# Patient Record
Sex: Male | Born: 1975
Health system: Southern US, Community
[De-identification: ages and names within clinical notes are randomized; demographics above are authoritative.]

## PROBLEM LIST (undated history)

## (undated) DIAGNOSIS — I10 Essential (primary) hypertension: Secondary | ICD-10-CM

## (undated) DIAGNOSIS — E785 Hyperlipidemia, unspecified: Secondary | ICD-10-CM

## (undated) DIAGNOSIS — I4891 Unspecified atrial fibrillation: Secondary | ICD-10-CM

## (undated) DIAGNOSIS — M1990 Unspecified osteoarthritis, unspecified site: Secondary | ICD-10-CM

## (undated) DIAGNOSIS — R7303 Prediabetes: Secondary | ICD-10-CM

## (undated) DIAGNOSIS — I499 Cardiac arrhythmia, unspecified: Secondary | ICD-10-CM

## (undated) HISTORY — PX: NO PAST SURGERIES: SHX2092

## (undated) HISTORY — DX: Unspecified osteoarthritis, unspecified site: M19.90

## (undated) HISTORY — DX: Prediabetes: R73.03

---

## 2013-11-12 ENCOUNTER — Emergency Department (HOSPITAL_COMMUNITY)
Admission: EM | Admit: 2013-11-12 | Discharge: 2013-11-12 | Disposition: A | Discharge status: Home / Self Care | Payer: Self-pay | Source: Home / Self Care | Attending: Family Medicine | Admitting: Family Medicine

## 2013-11-12 ENCOUNTER — Encounter (HOSPITAL_COMMUNITY): Payer: Self-pay | Admitting: Emergency Medicine

## 2013-11-12 DIAGNOSIS — L97909 Non-pressure chronic ulcer of unspecified part of unspecified lower leg with unspecified severity: Secondary | ICD-10-CM

## 2013-11-12 DIAGNOSIS — L97922 Non-pressure chronic ulcer of unspecified part of left lower leg with fat layer exposed: Secondary | ICD-10-CM

## 2013-11-12 MED ORDER — DOXYCYCLINE HYCLATE 100 MG PO CAPS: 100.0000 mg | ORAL_CAPSULE | Freq: Two times a day (BID) | ORAL | Status: DC

## 2013-11-12 NOTE — ED Provider Notes (Signed)
Charles Huang is a 37 y.o. male who presents to Urgent Care today for left lower leg wound. Patient dropped a heavy object onto his left lower anterior leg on or around November 3 at work. He had an initial laceration and was seen at [facility] urgent care. He never notified his employer and has not pursued workers compensation claim yet.  He was prescribed some unknown antibiotics at the urgent care however he is instead took old leftover amoxicillin. The wound has worsened and developed two ulcerations. He notes a foul smell. He denies any significant tenderness fevers or chills. He is able to continue working. He feels well otherwise.  He has no known medical problems   History reviewed. No pertinent past medical history. History  Substance Use Topics  . Smoking status: Current Every Day Smoker  . Smokeless tobacco: Not on file  . Alcohol Use: Yes   ROS as above Medications reviewed. No current facility-administered medications for this encounter.   Current Outpatient Prescriptions  Medication Sig Dispense Refill  . doxycycline (VIBRAMYCIN) 100 MG capsule Take 1 capsule (100 mg total) by mouth 2 (two) times daily.  20 capsule  0    Exam:  BP 154/98  Pulse 111  Temp(Src) 97.6 F (36.4 C) (Oral)  Resp 16  SpO2 96% Gen: Well NAD LEFT LEG:  2 ulcerations on the anterior lower leg. The most distal one is larger measuring approximately 1-1/2 cm in diameter and approximately one centimeters in depth. There is a scar overlying the central portion of the ulceration. The ulceration seems to extend through the dermis into the fat layer. There is granulation tissue present.  The more proximal ulceration is more linear approximately 1 cm in length and a half a centimeter in length and more shallow.  There is no significant surrounding erythema or induration. No significant tenderness.  Capillary refill is intact distally  Assessment and Plan: 37 y.o. male with unstageable ulcerations of  the left lower leg.  Is possibly infected. Plan: We'll use doxycycline initially. Additionally we'll start wet to dry dressing changes and compression to attempt to promote leg wound healing.  Additionally have referred patient to [place] where he should be able to receive followup for this issue.  I have additionally written a letter to his employer and recommended the patient pursue a workers compensation claim for this issue.  Discussed warning signs or symptoms. Please see discharge instructions. Patient expresses understanding.      Rodolph Bong, MD 11/12/13 [phone]

## 2013-11-12 NOTE — ED Notes (Signed)
Pt reports he inj himself at work around 11/3 w/a metal object to the left lower extremity He went to another urgent care where he was given a rx for an antibiotic that he never filled b/c he had left over Amox and hydrocodone Today he states he has a mild pain but what worries him a foul odor.  Last tetanus has been between 5-10 yrs.... Has been going to work daily.  Alert w/no signs of acute distress... Ambulated to exam room w/NAD

## 2014-01-06 ENCOUNTER — Encounter (HOSPITAL_COMMUNITY): Payer: Self-pay | Admitting: Emergency Medicine

## 2014-01-06 ENCOUNTER — Emergency Department (INDEPENDENT_AMBULATORY_CARE_PROVIDER_SITE_OTHER)
Admission: EM | Admit: 2014-01-06 | Discharge: 2014-01-06 | Disposition: A | Discharge status: Home / Self Care | Payer: 59 | Source: Home / Self Care | Attending: Family Medicine | Admitting: Family Medicine

## 2014-01-06 DIAGNOSIS — J069 Acute upper respiratory infection, unspecified: Secondary | ICD-10-CM

## 2014-01-06 LAB — POCT RAPID STREP A: Streptococcus, Group A Screen (Direct): NEGATIVE

## 2014-01-06 MED ORDER — IPRATROPIUM BROMIDE 0.06 % NA SOLN: 2.0000 | Freq: Four times a day (QID) | NASAL | Status: DC

## 2014-01-06 NOTE — Discharge Instructions (Signed)
Please be aware that your blood pressure is elevated at today's visit and that this should be re-checked by your primary care physician in 7-10 days.

## 2014-01-06 NOTE — ED Provider Notes (Signed)
CSN: [id number]     Arrival date & time 01/06/14  1620 History   First MD Initiated Contact with Patient 01/06/14 1732     Chief Complaint  Patient presents with  . Sore Throat   (Consider location/radiation/quality/duration/timing/severity/associated sxs/prior Treatment) Patient is a 38 y.o. male presenting with URI. The history is provided by the patient.  URI Presenting symptoms: congestion, cough, rhinorrhea and sore throat   Severity:  Moderate Onset quality:  Gradual Duration:  5 days Progression:  Waxing and waning Chronicity:  New Associated symptoms: myalgias   Associated symptoms: no swollen glands and no wheezing     History reviewed. No pertinent past medical history. History reviewed. No pertinent past surgical history. History reviewed. No pertinent family history. History  Substance Use Topics  . Smoking status: Current Every Day Smoker  . Smokeless tobacco: Not on file  . Alcohol Use: Yes    Review of Systems  HENT: Positive for congestion, rhinorrhea and sore throat.   Respiratory: Positive for cough. Negative for wheezing.   Musculoskeletal: Positive for myalgias.  All other systems reviewed and are negative.    Allergies  Review of patient's allergies indicates no known allergies.  Home Medications   Current Outpatient Rx  Name  Route  Sig  Dispense  Refill  . doxycycline (VIBRAMYCIN) 100 MG capsule   Oral   Take 1 capsule (100 mg total) by mouth 2 (two) times daily.   20 capsule   0    BP 153/95  Pulse 112  Temp(Src) 98.9 F (37.2 C) (Oral)  Resp 18  SpO2 100% Physical Exam  Nursing note and vitals reviewed. Constitutional: He is oriented to person, place, and time. He appears well-developed and well-nourished. No distress.  +obese  HENT:  Head: Normocephalic and atraumatic.  Right Ear: Hearing, tympanic membrane, external ear and ear canal normal.  Left Ear: Hearing, tympanic membrane, external ear and ear canal normal.  Nose:  Rhinorrhea present.  Mouth/Throat: Uvula is midline and mucous membranes are normal. No oral lesions. No trismus in the jaw. Posterior oropharyngeal erythema present. No oropharyngeal exudate, posterior oropharyngeal edema or tonsillar abscesses.  +PND  Eyes: Conjunctivae are normal. Right eye exhibits no discharge. Left eye exhibits no discharge. No scleral icterus.  Neck: Normal range of motion and full passive range of motion without pain. Neck supple. No thyromegaly present.  Cardiovascular: Normal rate, regular rhythm and normal heart sounds.   Pulmonary/Chest: Effort normal and breath sounds normal.  Abdominal: There is no tenderness.  Musculoskeletal: Normal range of motion.  Lymphadenopathy:    He has no cervical adenopathy.  Neurological: He is alert and oriented to person, place, and time.  Skin: Skin is warm and dry. No rash noted.  Psychiatric: He has a normal mood and affect. His behavior is normal.    ED Course  Procedures (including critical care time) Labs Review Labs Reviewed  POCT RAPID STREP A ([facility] ONLY)   Imaging Review No results found.  EKG Interpretation    Date/Time:    Ventricular Rate:    PR Interval:    QRS Duration:   QT Interval:    QTC Calculation:   R Axis:     Text Interpretation:              MDM  Rapid strep negative. Patient states what is bothering him most at this point is his post nasal drainage.Will prescribe atrovent nasal spray and advise PCP follow up if symptoms do not  improve and for BP recheck as today his BP elevated at time of visit.     Jess Barters [place], [place] 01/06/14 [phone]

## 2014-01-06 NOTE — ED Notes (Signed)
C/o  Sore / swelling of throat.  Stuffy nose.   Headache.  And congestion.  Fever on Tuesday.   Denies any other symptoms.   No relief with otc meds.

## 2014-01-07 NOTE — ED Provider Notes (Signed)
Medical screening examination/treatment/procedure(s) were performed by a resident physician or non-physician practitioner and as the supervising physician I was immediately available for consultation/collaboration.  Judaea Burgoon, MD    Geselle Cardosa S Amaree Leeper, MD 01/07/14 0842 

## 2014-01-08 LAB — CULTURE, GROUP A STREP

## 2014-12-10 ENCOUNTER — Emergency Department (HOSPITAL_BASED_OUTPATIENT_CLINIC_OR_DEPARTMENT_OTHER)
Admission: EM | Admit: 2014-12-10 | Discharge: 2014-12-10 | Disposition: A | Discharge status: Home / Self Care | Payer: BC Managed Care – PPO | Attending: Emergency Medicine | Admitting: Emergency Medicine

## 2014-12-10 ENCOUNTER — Encounter (HOSPITAL_BASED_OUTPATIENT_CLINIC_OR_DEPARTMENT_OTHER): Payer: Self-pay

## 2014-12-10 ENCOUNTER — Emergency Department (HOSPITAL_BASED_OUTPATIENT_CLINIC_OR_DEPARTMENT_OTHER): Payer: BC Managed Care – PPO

## 2014-12-10 DIAGNOSIS — M5416 Radiculopathy, lumbar region: Secondary | ICD-10-CM

## 2014-12-10 DIAGNOSIS — Z720 Tobacco use: Secondary | ICD-10-CM | POA: Insufficient documentation

## 2014-12-10 DIAGNOSIS — M545 Low back pain, unspecified: Secondary | ICD-10-CM

## 2014-12-10 LAB — URINALYSIS, ROUTINE W REFLEX MICROSCOPIC
BILIRUBIN URINE: NEGATIVE
Glucose, UA: NEGATIVE mg/dL
Ketones, ur: NEGATIVE mg/dL
Leukocytes, UA: NEGATIVE
Nitrite: NEGATIVE
PROTEIN: NEGATIVE mg/dL
Specific Gravity, Urine: 1.016 (ref 1.005–1.030)
Urobilinogen, UA: 0.2 mg/dL (ref 0.0–1.0)
pH: 6.5 (ref 5.0–8.0)

## 2014-12-10 LAB — URINE MICROSCOPIC-ADD ON

## 2014-12-10 MED ORDER — PREDNISONE 20 MG PO TABS: ORAL_TABLET | ORAL | Status: DC

## 2014-12-10 MED ORDER — HYDROCODONE-ACETAMINOPHEN 5-325 MG PO TABS: 2.0000 | ORAL_TABLET | ORAL | Status: DC | PRN

## 2014-12-10 MED ORDER — CYCLOBENZAPRINE HCL 10 MG PO TABS: 10.0000 mg | ORAL_TABLET | Freq: Three times a day (TID) | ORAL | Status: DC | PRN

## 2014-12-10 MED ORDER — DEXAMETHASONE SODIUM PHOSPHATE 10 MG/ML IJ SOLN
10.0000 mg | Freq: Once | INTRAMUSCULAR | Status: AC
  Administered 2014-12-10: 10 mg via INTRAMUSCULAR
  Filled 2014-12-10: qty 1

## 2014-12-10 NOTE — ED Provider Notes (Signed)
CSN: [id number]     Arrival date & time 12/10/14  1045 History   First MD Initiated Contact with Patient 12/10/14 1057     Chief Complaint  Patient presents with  . Back Pain     (Consider location/radiation/quality/duration/timing/severity/associated sxs/prior Treatment) HPI Comments: Patient presents to the ER for evaluation of low back pain. Patient reports that pain started 2 days ago. He denies any injury. Pain is in the lower back diffusely but doesn't radiate down the back and left leg. No loss of strength or sensation in the lower extremities. No change of bowel or bladder function.  Patient is a 38 y.o. male presenting with back pain.  Back Pain   Past Medical History  Diagnosis Date  . Back pain    History reviewed. No pertinent past surgical history. No family history on file. History  Substance Use Topics  . Smoking status: Current Every Day Smoker  . Smokeless tobacco: Not on file  . Alcohol Use: Yes    Review of Systems  Musculoskeletal: Positive for back pain.  All other systems reviewed and are negative.     Allergies  Review of patient's allergies indicates no known allergies.  Home Medications   Prior to Admission medications   Not on File   BP 157/110 mmHg  Pulse 106  Temp(Src) 98.3 F (36.8 C) (Oral)  Resp 20  Ht 6\' 4"  (1.93 m)  Wt 370 lb (167.831 kg)  BMI 45.06 kg/m2  SpO2 98% Physical Exam  Constitutional: He is oriented to person, place, and time. He appears well-developed and well-nourished. No distress.  HENT:  Head: Normocephalic and atraumatic.  Right Ear: Hearing normal.  Left Ear: Hearing normal.  Nose: Nose normal.  Mouth/Throat: Oropharynx is clear and moist and mucous membranes are normal.  Eyes: Conjunctivae and EOM are normal. Pupils are equal, round, and reactive to light.  Neck: Normal range of motion. Neck supple.  Cardiovascular: Regular rhythm, S1 normal and S2 normal.  Exam reveals no gallop and no friction rub.     No murmur heard. Pulmonary/Chest: Effort normal and breath sounds normal. No respiratory distress. He exhibits no tenderness.  Abdominal: Soft. Normal appearance and bowel sounds are normal. There is no hepatosplenomegaly. There is no tenderness. There is no rebound, no guarding, no tenderness at McBurney's point and negative Murphy's sign. No hernia.  Musculoskeletal: Normal range of motion.       Lumbar back: He exhibits tenderness.       Back:  Neurological: He is alert and oriented to person, place, and time. He has normal strength. No cranial nerve deficit or sensory deficit. Coordination normal. GCS eye subscore is 4. GCS verbal subscore is 5. GCS motor subscore is 6.  Slightly positive straight leg raise  Skin: Skin is warm, dry and intact. No rash noted. No cyanosis.  Psychiatric: He has a normal mood and affect. His speech is normal and behavior is normal. Thought content normal.  Nursing note and vitals reviewed.   ED Course  Procedures (including critical care time) Labs Review Labs Reviewed  URINALYSIS, ROUTINE W REFLEX MICROSCOPIC - Abnormal; Notable for the following:    Hgb urine dipstick TRACE (*)    All other components within normal limits  URINE MICROSCOPIC-ADD ON    Imaging Review Dg Lumbar Spine Complete  12/10/2014   CLINICAL DATA:  Low back pain for 3 day  EXAM: LUMBAR SPINE - COMPLETE 4+ VIEW  COMPARISON:  None.  FINDINGS: Anatomic alignment. No vertebral  compression. Disc height maintained.  IMPRESSION: Negative.   Electronically Signed   By: Maryclare BeanArt  Hoss M.D.   On: 12/10/2014 11:49     EKG Interpretation None      MDM   Final diagnoses:  Low back pain   Patient presents to the ER for evaluation of low back pain. Patient reports that he has had intermittent problems with his back in the past. There was no injury today. Patient had an x-ray which showed no abnormality. Urinalysis did not suggest infection and he does not appear consistent with a kidney  stone. Patient to be treated empirically for mild lumbar radiculopathy.    Gilda Creasehristopher J. Pollina, MD 12/10/14 1248

## 2014-12-10 NOTE — ED Notes (Signed)
Patient here with lower back pain x 2 days, denies injury but reports problems with same in past that required steroids for treatment. Pain with ambulation and change in position

## 2014-12-10 NOTE — Discharge Instructions (Signed)

## 2015-01-18 ENCOUNTER — Other Ambulatory Visit: Payer: Self-pay | Admitting: Physician Assistant

## 2015-01-18 ENCOUNTER — Ambulatory Visit
Admission: RE | Admit: 2015-01-18 | Discharge: 2015-01-18 | Disposition: A | Discharge status: Home / Self Care | Payer: BLUE CROSS/BLUE SHIELD | Source: Ambulatory Visit | Attending: Physician Assistant | Admitting: Physician Assistant

## 2015-01-18 DIAGNOSIS — M25562 Pain in left knee: Principal | ICD-10-CM

## 2015-01-18 DIAGNOSIS — M25561 Pain in right knee: Secondary | ICD-10-CM

## 2015-05-30 ENCOUNTER — Encounter (HOSPITAL_BASED_OUTPATIENT_CLINIC_OR_DEPARTMENT_OTHER): Payer: Self-pay | Admitting: *Deleted

## 2015-05-30 ENCOUNTER — Emergency Department (HOSPITAL_BASED_OUTPATIENT_CLINIC_OR_DEPARTMENT_OTHER): Payer: BLUE CROSS/BLUE SHIELD

## 2015-05-30 ENCOUNTER — Emergency Department (HOSPITAL_BASED_OUTPATIENT_CLINIC_OR_DEPARTMENT_OTHER)
Admission: EM | Admit: 2015-05-30 | Discharge: 2015-05-30 | Disposition: A | Discharge status: Home / Self Care | Payer: BLUE CROSS/BLUE SHIELD | Attending: Emergency Medicine | Admitting: Emergency Medicine

## 2015-05-30 DIAGNOSIS — E785 Hyperlipidemia, unspecified: Secondary | ICD-10-CM | POA: Diagnosis not present

## 2015-05-30 DIAGNOSIS — M5412 Radiculopathy, cervical region: Secondary | ICD-10-CM | POA: Insufficient documentation

## 2015-05-30 DIAGNOSIS — Z720 Tobacco use: Secondary | ICD-10-CM | POA: Diagnosis not present

## 2015-05-30 DIAGNOSIS — Z79899 Other long term (current) drug therapy: Secondary | ICD-10-CM | POA: Diagnosis not present

## 2015-05-30 DIAGNOSIS — I10 Essential (primary) hypertension: Secondary | ICD-10-CM | POA: Insufficient documentation

## 2015-05-30 DIAGNOSIS — R0789 Other chest pain: Secondary | ICD-10-CM | POA: Insufficient documentation

## 2015-05-30 DIAGNOSIS — R079 Chest pain, unspecified: Secondary | ICD-10-CM | POA: Diagnosis present

## 2015-05-30 HISTORY — DX: Essential (primary) hypertension: I10

## 2015-05-30 HISTORY — DX: Hyperlipidemia, unspecified: E78.5

## 2015-05-30 LAB — COMPREHENSIVE METABOLIC PANEL
ALT: 50 U/L (ref 17–63)
AST: 33 U/L (ref 15–41)
Albumin: 4 g/dL (ref 3.5–5.0)
Alkaline Phosphatase: 45 U/L (ref 38–126)
Anion gap: 10 (ref 5–15)
BUN: 15 mg/dL (ref 6–20)
CHLORIDE: 101 mmol/L (ref 101–111)
CO2: 29 mmol/L (ref 22–32)
CREATININE: 1.13 mg/dL (ref 0.61–1.24)
Calcium: 9.4 mg/dL (ref 8.9–10.3)
GFR calc non Af Amer: >60 mL/min (ref >60)
Glucose, Bld: 99 mg/dL (ref 65–99)
Potassium: 4 mmol/L (ref 3.5–5.1)
SODIUM: 140 mmol/L (ref 135–145)
TOTAL PROTEIN: 7.3 g/dL (ref 6.5–8.1)
Total Bilirubin: 0.5 mg/dL (ref 0.3–1.2)

## 2015-05-30 LAB — CBC WITH DIFFERENTIAL/PLATELET
Basophils Absolute: 0 10*3/uL (ref 0.0–0.1)
Basophils Relative: 0 % (ref 0–1)
Eosinophils Absolute: 0.1 10*3/uL (ref 0.0–0.7)
Eosinophils Relative: 1 % (ref 0–5)
HCT: 43.8 % (ref 39.0–52.0)
HEMOGLOBIN: 14.3 g/dL (ref 13.0–17.0)
LYMPHS ABS: 2.3 10*3/uL (ref 0.7–4.0)
Lymphocytes Relative: 27 % (ref 12–46)
MCH: 27 pg (ref 26.0–34.0)
MCHC: 32.6 g/dL (ref 30.0–36.0)
MCV: 82.6 fL (ref 78.0–100.0)
MONO ABS: 0.7 10*3/uL (ref 0.1–1.0)
MONOS PCT: 9 % (ref 3–12)
NEUTROS PCT: 63 % (ref 43–77)
Neutro Abs: 5.3 10*3/uL (ref 1.7–7.7)
Platelets: 220 10*3/uL (ref 150–400)
RBC: 5.3 MIL/uL (ref 4.22–5.81)
RDW: 15.2 % (ref 11.5–15.5)
WBC: 8.5 10*3/uL (ref 4.0–10.5)

## 2015-05-30 LAB — TROPONIN I: Troponin I: <0.03 ng/mL (ref <0.031)

## 2015-05-30 MED ORDER — PREDNISONE 20 MG PO TABS
ORAL_TABLET | ORAL | Status: DC
Start: 2015-05-30 — End: 2016-06-23

## 2015-05-30 MED ORDER — CYCLOBENZAPRINE HCL 10 MG PO TABS: 10.0000 mg | ORAL_TABLET | Freq: Three times a day (TID) | ORAL | Status: DC | PRN

## 2015-05-30 MED ORDER — HYDROCODONE-ACETAMINOPHEN 5-325 MG PO TABS: 2.0000 | ORAL_TABLET | ORAL | Status: DC | PRN

## 2015-05-30 NOTE — ED Notes (Signed)
Pt c/o intermittent left side CP x2 weeks. Pt and family sts pt has not been feeling well for 2 months. He reports some SOB and radiation to left arm shoulder.

## 2015-05-30 NOTE — ED Notes (Signed)
Pt returned from xray

## 2015-05-30 NOTE — Discharge Instructions (Signed)
Cervical Radiculopathy Cervical radiculopathy happens when a nerve in the neck is pinched or bruised by a slipped (herniated) disk or by arthritic changes in the bones of the cervical spine. This can occur due to an injury or as part of the normal aging process. Pressure on the cervical nerves can cause pain or numbness that runs from your neck all the way down into your arm and fingers. CAUSES  There are many possible causes, including:  Injury.  Muscle tightness in the neck from overuse.  Swollen, painful joints (arthritis).  Breakdown or degeneration in the bones and joints of the spine (spondylosis) due to aging.  Bone spurs that may develop near the cervical nerves. SYMPTOMS  Symptoms include pain, weakness, or numbness in the affected arm and hand. Pain can be severe or irritating. Symptoms may be worse when extending or turning the neck. DIAGNOSIS  Your caregiver will ask about your symptoms and do a physical exam. He or she may test your strength and reflexes. X-rays, CT scans, and MRI scans may be needed in cases of injury or if the symptoms do not go away after a period of time. Electromyography (EMG) or nerve conduction testing may be done to study how your nerves and muscles are working. TREATMENT  Your caregiver may recommend certain exercises to help relieve your symptoms. Cervical radiculopathy can, and often does, get better with time and treatment. If your problems continue, treatment options may include:  Wearing a soft collar for short periods of time.  Physical therapy to strengthen the neck muscles.  Medicines, such as nonsteroidal anti-inflammatory drugs (NSAIDs), oral corticosteroids, or spinal injections.  Surgery. Different types of surgery may be done depending on the cause of your problems. HOME CARE INSTRUCTIONS   Put ice on the affected area.  Put ice in a plastic bag.  Place a towel between your skin and the bag.  Leave the ice on for 15-20 minutes,  03-04 times a day or as directed by your caregiver.  If ice does not help, you can try using heat. Take a warm shower or bath, or use a hot water bottle as directed by your caregiver.  You may try a gentle neck and shoulder massage.  Use a flat pillow when you sleep.  Only take over-the-counter or prescription medicines for pain, discomfort, or fever as directed by your caregiver.  If physical therapy was prescribed, follow your caregiver's directions.  If a soft collar was prescribed, use it as directed. SEEK IMMEDIATE MEDICAL CARE IF:   Your pain gets much worse and cannot be controlled with medicines.  You have weakness or numbness in your hand, arm, face, or leg.  You have a high fever or a stiff, rigid neck.  You lose bowel or bladder control (incontinence).  You have trouble with walking, balance, or speaking. MAKE SURE YOU:   Understand these instructions.  Will watch your condition.  Will get help right away if you are not doing well or get worse. Document Released: 08/26/2001 Document Revised: 02/23/2012 Document Reviewed: 07/15/2011 [facility] Patient Information 2015 [place], [place]. This information is not intended to replace advice given to you by your health care provider. Make sure you discuss any questions you have with your health care provider.  Chest Pain (Nonspecific) It is often hard to give a specific diagnosis for the cause of chest pain. There is always a chance that your pain could be related to something serious, such as a heart attack or a blood clot  in the lungs. You need to follow up with your health care provider for further evaluation. CAUSES   Heartburn.  Pneumonia or bronchitis.  Anxiety or stress.  Inflammation around your heart (pericarditis) or lung (pleuritis or pleurisy).  A blood clot in the lung.  A collapsed lung (pneumothorax). It can develop suddenly on its own (spontaneous pneumothorax) or from trauma to the chest.  Shingles  infection (herpes zoster virus). The chest wall is composed of bones, muscles, and cartilage. Any of these can be the source of the pain.  The bones can be bruised by injury.  The muscles or cartilage can be strained by coughing or overwork.  The cartilage can be affected by inflammation and become sore (costochondritis). DIAGNOSIS  Lab tests or other studies may be needed to find the cause of your pain. Your health care provider may have you take a test called an ambulatory electrocardiogram (ECG). An ECG records your heartbeat patterns over a 24-hour period. You may also have other tests, such as:  Transthoracic echocardiogram (TTE). During echocardiography, sound waves are used to evaluate how blood flows through your heart.  Transesophageal echocardiogram (TEE).  Cardiac monitoring. This allows your health care provider to monitor your heart rate and rhythm in real time.  Holter monitor. This is a portable device that records your heartbeat and can help diagnose heart arrhythmias. It allows your health care provider to track your heart activity for several days, if needed.  Stress tests by exercise or by giving medicine that makes the heart beat faster. TREATMENT   Treatment depends on what may be causing your chest pain. Treatment may include:  Acid blockers for heartburn.  Anti-inflammatory medicine.  Pain medicine for inflammatory conditions.  Antibiotics if an infection is present.  You may be advised to change lifestyle habits. This includes stopping smoking and avoiding alcohol, caffeine, and chocolate.  You may be advised to keep your head raised (elevated) when sleeping. This reduces the chance of acid going backward from your stomach into your esophagus. Most of the time, nonspecific chest pain will improve within 2-3 days with rest and mild pain medicine.  HOME CARE INSTRUCTIONS   If antibiotics were prescribed, take them as directed. Finish them even if you start  to feel better.  For the next few days, avoid physical activities that bring on chest pain. Continue physical activities as directed.  Do not use any tobacco products, including cigarettes, chewing tobacco, or electronic cigarettes.  Avoid drinking alcohol.  Only take medicine as directed by your health care provider.  Follow your health care provider's suggestions for further testing if your chest pain does not go away.  Keep any follow-up appointments you made. If you do not go to an appointment, you could develop lasting (chronic) problems with pain. If there is any problem keeping an appointment, call to reschedule. SEEK MEDICAL CARE IF:   Your chest pain does not go away, even after treatment.  You have a rash with blisters on your chest.  You have a fever. SEEK IMMEDIATE MEDICAL CARE IF:   You have increased chest pain or pain that spreads to your arm, neck, jaw, back, or abdomen.  You have shortness of breath.  You have an increasing cough, or you cough up blood.  You have severe back or abdominal pain.  You feel nauseous or vomit.  You have severe weakness.  You faint.  You have chills. This is an emergency. Do not wait to see if the pain  will go away. Get medical help at once. Call your local emergency services (911 in [place] Do not drive yourself to the hospital. MAKE SURE YOU:   Understand these instructions.  Will watch your condition.  Will get help right away if you are not doing well or get worse. Document Released: 09/10/2005 Document Revised: 12/06/2013 Document Reviewed: 07/06/2008 [facility] Patient Information 2015 [place], [place]. This information is not intended to replace advice given to you by your health care provider. Make sure you discuss any questions you have with your health care provider.

## 2015-05-30 NOTE — ED Notes (Signed)
Patient transported to X-ray 

## 2015-05-30 NOTE — ED Provider Notes (Signed)
CSN: [id number]     Arrival date & time 05/30/15  0919 History   First MD Initiated Contact with Patient 05/30/15 [phone]     Chief Complaint  Patient presents with  . Chest Pain     (Consider location/radiation/quality/duration/timing/severity/associated sxs/prior Treatment) HPI Comments: Patient presents to the emergency department for evaluation of chest pain. Patient reports the symptoms have been ongoing for weeks. He was recently seen by his doctor and diagnosed with elevated blood pressure and cholesterol. He was started on medication for both. Patient reports for the last 2 weeks he has been having intermittent episodes of pain in his chest. He reports a numbness and pain in the left side of his neck, left upper chest, left arm. This occurs intermittently. He reports that it lasts less than 1 minute when it occurs. Symptoms can occur while at rest. He has not noticed any relation to exertion.  Patient reports that multiple family members have had heart problems in his family, but he is not sure what the diagnosis was. He is a smoker, but is currently quitting.  Patient is a 39 y.o. male presenting with chest pain.  Chest Pain   Past Medical History  Diagnosis Date  . Back pain   . Hypertension   . Hyperlipidemia    History reviewed. No pertinent past surgical history. No family history on file. History  Substance Use Topics  . Smoking status: Current Some Day Smoker  . Smokeless tobacco: Not on file  . Alcohol Use: Yes    Review of Systems  Cardiovascular: Positive for chest pain.  All other systems reviewed and are negative.     Allergies  Review of patient's allergies indicates no known allergies.  Home Medications   Prior to Admission medications   Medication Sig Start Date End Date Taking? Authorizing Provider  atorvastatin (LIPITOR) 10 MG tablet Take 10 mg by mouth daily.   Yes Historical Provider, MD  lisinopril (PRINIVIL,ZESTRIL) 20 MG tablet Take 20 mg by  mouth daily.   Yes Historical Provider, MD  cyclobenzaprine (FLEXERIL) 10 MG tablet Take 1 tablet (10 mg total) by mouth 3 (three) times daily as needed for muscle spasms. 12/10/14   Gilda Crease, MD  HYDROcodone-acetaminophen (NORCO/VICODIN) 5-325 MG per tablet Take 2 tablets by mouth every 4 (four) hours as needed for moderate pain. 12/10/14   Gilda Crease, MD  predniSONE (DELTASONE) 20 MG tablet 3 tabs po daily x 3 days, then 2 tabs x 3 days, then 1.5 tabs x 3 days, then 1 tab x 3 days, then 0.5 tabs x 3 days 12/10/14   Gilda Crease, MD   BP 157/90 mmHg  Pulse 83  Temp(Src) 98.5 F (36.9 C) (Oral)  Resp 20  Ht  (1.93 m)  Wt 375 lb (170.099 kg)  BMI 45.67 kg/m2  SpO2 97% Physical Exam  Constitutional: He is oriented to person, place, and time. He appears well-developed and well-nourished. No distress.  HENT:  Head: Normocephalic and atraumatic.  Right Ear: Hearing normal.  Left Ear: Hearing normal.  Nose: Nose normal.  Mouth/Throat: Oropharynx is clear and moist and mucous membranes are normal.  Eyes: Conjunctivae and EOM are normal. Pupils are equal, round, and reactive to light.  Neck: Normal range of motion. Neck supple.  Cardiovascular: Regular rhythm, S1 normal and S2 normal.  Exam reveals no gallop and no friction rub.   No murmur heard. Pulmonary/Chest: Effort normal and breath sounds normal. No respiratory distress. He exhibits  no tenderness.  Abdominal: Soft. Normal appearance and bowel sounds are normal. There is no hepatosplenomegaly. There is no tenderness. There is no rebound, no guarding, no tenderness at McBurney's point and negative Murphy's sign. No hernia.  Musculoskeletal: Normal range of motion.  Neurological: He is alert and oriented to person, place, and time. He has normal strength. No cranial nerve deficit or sensory deficit. Coordination normal. GCS eye subscore is 4. GCS verbal subscore is 5. GCS motor subscore is 6.  Skin:  Skin is warm, dry and intact. No rash noted. No cyanosis.  Psychiatric: He has a normal mood and affect. His speech is normal and behavior is normal. Thought content normal.  Nursing note and vitals reviewed.   ED Course  Procedures (including critical care time) Labs Review Labs Reviewed  CBC WITH DIFFERENTIAL/PLATELET  COMPREHENSIVE METABOLIC PANEL  TROPONIN I    Imaging Review No results found.   EKG Interpretation   Date/Time:  Wednesday May 30 2015 09:27:41 EDT Ventricular Rate:  69 PR Interval:  152 QRS Duration: 102 QT Interval:  374 QTC Calculation: 400 R Axis:   51 Text Interpretation:  Normal sinus rhythm Normal ECG Confirmed by Claudie Brickhouse   MD, Mychele Seyller ([id number]) on 05/30/2015 9:47:43 AM      MDM   Final diagnoses:  Chest pain    Patient presents to the ER for evaluation of chest pain. Pain is atypical in nature. He is experiencing intermittent pain across his upper chest, left neck and left arm that lasts only for a minute at a time. There is no exertional component. Patient's heart score is 2, has multiple cardiac risk factors, but symptoms are not felt to be consistent with cardiac etiology. As he is a low risk patient, it is felt that he is appropriate for outpatient management. Patient will be referred to cardiology for further evaluation, possible stress testing based on his risk factors, but does not require hospitalization at this time. He can also follow up with his primary doctor.  More consistent with cervical radiculopathy. He does have a history of chronic back pain and sciatica. Primary physician can further work this up.    Gilda Crease, MD 05/30/15 1050

## 2016-04-28 DIAGNOSIS — E785 Hyperlipidemia, unspecified: Secondary | ICD-10-CM | POA: Diagnosis not present

## 2016-04-28 DIAGNOSIS — I10 Essential (primary) hypertension: Secondary | ICD-10-CM | POA: Insufficient documentation

## 2016-04-28 DIAGNOSIS — Z6841 Body Mass Index (BMI) 40.0 and over, adult: Secondary | ICD-10-CM | POA: Diagnosis not present

## 2016-04-28 DIAGNOSIS — M170 Bilateral primary osteoarthritis of knee: Secondary | ICD-10-CM | POA: Insufficient documentation

## 2016-04-28 DIAGNOSIS — E1169 Type 2 diabetes mellitus with other specified complication: Secondary | ICD-10-CM | POA: Insufficient documentation

## 2016-04-28 DIAGNOSIS — E782 Mixed hyperlipidemia: Secondary | ICD-10-CM | POA: Insufficient documentation

## 2016-04-28 DIAGNOSIS — E1159 Type 2 diabetes mellitus with other circulatory complications: Secondary | ICD-10-CM | POA: Insufficient documentation

## 2016-05-16 DIAGNOSIS — E559 Vitamin D deficiency, unspecified: Secondary | ICD-10-CM | POA: Diagnosis not present

## 2016-05-16 DIAGNOSIS — R739 Hyperglycemia, unspecified: Secondary | ICD-10-CM | POA: Diagnosis not present

## 2016-05-16 DIAGNOSIS — Z79899 Other long term (current) drug therapy: Secondary | ICD-10-CM | POA: Diagnosis not present

## 2016-05-16 DIAGNOSIS — E782 Mixed hyperlipidemia: Secondary | ICD-10-CM | POA: Diagnosis not present

## 2016-05-22 DIAGNOSIS — Z01818 Encounter for other preprocedural examination: Secondary | ICD-10-CM | POA: Diagnosis not present

## 2016-05-22 DIAGNOSIS — K449 Diaphragmatic hernia without obstruction or gangrene: Secondary | ICD-10-CM | POA: Diagnosis not present

## 2016-06-03 DIAGNOSIS — Z713 Dietary counseling and surveillance: Secondary | ICD-10-CM | POA: Diagnosis not present

## 2016-06-23 ENCOUNTER — Encounter (HOSPITAL_COMMUNITY): Payer: Self-pay | Admitting: *Deleted

## 2016-06-23 ENCOUNTER — Emergency Department (HOSPITAL_COMMUNITY): Payer: BLUE CROSS/BLUE SHIELD

## 2016-06-23 ENCOUNTER — Emergency Department (HOSPITAL_COMMUNITY)
Admission: EM | Admit: 2016-06-23 | Discharge: 2016-06-23 | Disposition: A | Discharge status: Home / Self Care | Payer: BLUE CROSS/BLUE SHIELD | Attending: Emergency Medicine | Admitting: Emergency Medicine

## 2016-06-23 DIAGNOSIS — Z7901 Long term (current) use of anticoagulants: Secondary | ICD-10-CM | POA: Diagnosis not present

## 2016-06-23 DIAGNOSIS — E785 Hyperlipidemia, unspecified: Secondary | ICD-10-CM | POA: Insufficient documentation

## 2016-06-23 DIAGNOSIS — R030 Elevated blood-pressure reading, without diagnosis of hypertension: Secondary | ICD-10-CM | POA: Diagnosis not present

## 2016-06-23 DIAGNOSIS — I4891 Unspecified atrial fibrillation: Secondary | ICD-10-CM

## 2016-06-23 DIAGNOSIS — Z87891 Personal history of nicotine dependence: Secondary | ICD-10-CM | POA: Insufficient documentation

## 2016-06-23 DIAGNOSIS — Z79899 Other long term (current) drug therapy: Secondary | ICD-10-CM | POA: Insufficient documentation

## 2016-06-23 DIAGNOSIS — R0789 Other chest pain: Secondary | ICD-10-CM | POA: Diagnosis present

## 2016-06-23 DIAGNOSIS — I480 Paroxysmal atrial fibrillation: Secondary | ICD-10-CM | POA: Insufficient documentation

## 2016-06-23 DIAGNOSIS — I10 Essential (primary) hypertension: Secondary | ICD-10-CM | POA: Insufficient documentation

## 2016-06-23 DIAGNOSIS — R079 Chest pain, unspecified: Secondary | ICD-10-CM | POA: Diagnosis not present

## 2016-06-23 LAB — BASIC METABOLIC PANEL
Anion gap: 6 (ref 5–15)
BUN: 17 mg/dL (ref 6–20)
CALCIUM: 9.3 mg/dL (ref 8.9–10.3)
CO2: 28 mmol/L (ref 22–32)
CREATININE: 0.91 mg/dL (ref 0.61–1.24)
Chloride: 105 mmol/L (ref 101–111)
GFR calc Af Amer: >60 mL/min (ref >60)
GLUCOSE: 145 mg/dL — AB (ref 65–99)
Potassium: 4.2 mmol/L (ref 3.5–5.1)
Sodium: 139 mmol/L (ref 135–145)

## 2016-06-23 LAB — CBC
HEMATOCRIT: 44.5 % (ref 39.0–52.0)
Hemoglobin: 14.4 g/dL (ref 13.0–17.0)
MCH: 27.4 pg (ref 26.0–34.0)
MCHC: 32.4 g/dL (ref 30.0–36.0)
MCV: 84.8 fL (ref 78.0–100.0)
Platelets: 241 10*3/uL (ref 150–400)
RBC: 5.25 MIL/uL (ref 4.22–5.81)
RDW: 16.2 % — AB (ref 11.5–15.5)
WBC: 7.9 10*3/uL (ref 4.0–10.5)

## 2016-06-23 LAB — I-STAT TROPONIN, ED: TROPONIN I, POC: 0 ng/mL (ref 0.00–0.08)

## 2016-06-23 MED ORDER — APIXABAN 5 MG PO TABS: 5.0000 mg | ORAL_TABLET | Freq: Two times a day (BID) | ORAL | Status: DC

## 2016-06-23 MED ORDER — FLECAINIDE ACETATE 100 MG PO TABS
300.0000 mg | ORAL_TABLET | Freq: Once | ORAL | Status: AC
  Administered 2016-06-23: 300 mg via ORAL
  Filled 2016-06-23: qty 3

## 2016-06-23 NOTE — Discharge Instructions (Signed)
You have been diagnosed with atrial fibrillation toady. You heart rate has returned to normal. Please take medication as directed. Please return immediately if any new or worsening signs or symptoms present.   Atrial Fibrillation Atrial fibrillation is a type of heartbeat that is irregular or fast (rapid). If you have this condition, your heart keeps quivering in a weird (chaotic) way. This condition can make it so your heart cannot pump blood normally. Having this condition gives a person more risk for stroke, heart failure, and other heart problems. There are different types of atrial fibrillation. Talk with your doctor to learn about the type that you have. HOME CARE  Take over-the-counter and prescription medicines only as told by your doctor.  If your doctor prescribed a blood-thinning medicine, take it exactly as told. Taking too much of it can cause bleeding. If you do not take enough of it, you will not have the protection that you need against stroke and other problems.  Do not use any tobacco products. These include cigarettes, chewing tobacco, and e-cigarettes. If you need help quitting, ask your doctor.  If you have apnea (obstructive sleep apnea), manage it as told by your doctor.  Do not drink alcohol.  Do not drink beverages that have caffeine. These include coffee, soda, and tea.  Maintain a healthy weight. Do not use diet pills unless your doctor says they are safe for you. Diet pills may make heart problems worse.  Follow diet instructions as told by your doctor.  Exercise regularly as told by your doctor.  Keep all follow-up visits as told by your doctor. This is important. GET HELP IF:  You notice a change in the speed, rhythm, or strength of your heartbeat.  You are taking a blood-thinning medicine and you notice more bruising.  You get tired more easily when you move or exercise. GET HELP RIGHT AWAY IF:  You have pain in your chest or your belly  (abdomen).  You have sweating or weakness.  You feel sick to your stomach (nauseous).  You notice blood in your throw up (vomit), poop (stool), or pee (urine).  You are short of breath.  You suddenly have swollen feet and ankles.  You feel dizzy.  Your suddenly get weak or numb in your face, arms, or legs, especially if it happens on one side of your body.  You have trouble talking, trouble understanding, or both.  Your face or your eyelid droops on one side. These symptoms may be an emergency. Do not wait to see if the symptoms will go away. Get medical help right away. Call your local emergency services (911 in the [place] Do not drive yourself to the hospital.   This information is not intended to replace advice given to you by your health care provider. Make sure you discuss any questions you have with your health care provider.   Document Released: 09/09/2008 Document Revised: 08/22/2015 Document Reviewed: 03/28/2015 Elsevier Interactive Patient Education [employer]2016 [employer] Inc.

## 2016-06-23 NOTE — ED Provider Notes (Signed)
CSN: [id number]     Arrival date & time 06/23/16  0211 History   First MD Initiated Contact with Patient 06/23/16 [phone]     Chief Complaint  Patient presents with  . Chest Pain     (Consider location/radiation/quality/duration/timing/severity/associated sxs/prior Treatment) HPI Comments: 40 year old male with a history of obesity, hypertension and hyperlipidemia presents to the emergency department for evaluation of chest pain. Patient states that he got up to use the bathroom this evening when he began to feel short of breath. This is followed by onset of palpitations characterized by a racing heartbeat. He states that he tried to count his pulse, but had difficulty because of how fast it seemed to be going. Patient does report some lightheadedness. The patient states that he tried to find a tablet of his blood pressure medication, but was unable to do so. No other medications taken PTA. He denies syncope or diaphoresis this evening as well as vomiting or recent fevers. He denies a hx of atrial fibrillation. He is a former smoker; quit 05/29/16.  Patient is a 40 y.o. male presenting with chest pain. The history is provided by the patient. No language interpreter was used.  Chest Pain Associated symptoms: palpitations and shortness of breath     Past Medical History  Diagnosis Date  . Back pain   . Hypertension   . Hyperlipidemia    History reviewed. No pertinent past surgical history. No family history on file. Social History  Substance Use Topics  . Smoking status: Former Smoker    Quit date: 05/29/2016  . Smokeless tobacco: None  . Alcohol Use: Yes     Comment: socially    Review of Systems  Respiratory: Positive for shortness of breath.   Cardiovascular: Positive for chest pain and palpitations.  Neurological: Positive for light-headedness.  Ten systems reviewed and are negative for acute change, except as noted in the HPI.    Allergies  Review of patient's allergies  indicates no known allergies.  Home Medications   Prior to Admission medications   Medication Sig Start Date End Date Taking? Authorizing Provider  atorvastatin (LIPITOR) 10 MG tablet Take 10 mg by mouth daily.   Yes Historical Provider, MD  ibuprofen (ADVIL,MOTRIN) 200 MG tablet Take 600-800 mg by mouth every 6 (six) hours as needed (for pain.).   Yes Historical Provider, MD  lisinopril-hydrochlorothiazide (PRINZIDE,ZESTORETIC) 20-12.5 MG tablet Take 1 tablet by mouth daily. 04/17/16  Yes Historical Provider, MD  naproxen sodium (ANAPROX) 220 MG tablet Take 220 mg by mouth 2 (two) times daily as needed (for pain.).   Yes Historical Provider, MD   BP 127/69 mmHg  Pulse 86  Temp(Src) 97.8 F (36.6 C) (Oral)  Resp 13  Ht 6\' 3"  (1.905 m)  Wt 172.367 kg  BMI 47.50 kg/m2  SpO2 97%   Physical Exam  Constitutional: He is oriented to person, place, and time. He appears well-developed and well-nourished. No distress.  Nontoxic, morbidly obese male.  HENT:  Head: Normocephalic and atraumatic.  Eyes: Conjunctivae and EOM are normal. No scleral icterus.  Neck: Normal range of motion.  Cardiovascular: An irregularly irregular rhythm present. Tachycardia present.   Tachycardia of 112-130's while at bedside.  Pulmonary/Chest: Effort normal. No respiratory distress. He has no wheezes.  Chest expansion symmetric. Mild dyspnea without tachypnea. SpO2 98% on room air.  Musculoskeletal: Normal range of motion.  Neurological: He is alert and oriented to person, place, and time. He exhibits normal muscle tone. Coordination normal.  GCS  15. Patient moving all extremities.  Skin: Skin is warm and dry. No rash noted. He is not diaphoretic. No erythema. No pallor.  Psychiatric: He has a normal mood and affect. His behavior is normal.  Nursing note and vitals reviewed.   ED Course  Procedures (including critical care time) Labs Review Labs Reviewed  BASIC METABOLIC PANEL - Abnormal; Notable for the  following:    Glucose, Bld 145 (*)    All other components within normal limits  CBC - Abnormal; Notable for the following:    RDW 16.2 (*)    All other components within normal limits  I-STAT TROPOININ, ED    Imaging Review Dg Chest 2 View  06/23/2016  CLINICAL DATA:  40 year old male with chest pain EXAM: CHEST  2 VIEW COMPARISON:  Chest radiograph dated 05/30/2015 FINDINGS: The heart size and mediastinal contours are within normal limits. Both lungs are clear. The visualized skeletal structures are unremarkable. IMPRESSION: No active cardiopulmonary disease. Electronically Signed   By: Elgie Collard M.D.   On: 06/23/2016 02:59     I have personally reviewed and evaluated these images and lab results as part of my medical decision-making.   EKG Interpretation   Date/Time:  Monday June 23 2016 02:20:55 EDT Ventricular Rate:  139 PR Interval:    QRS Duration: 98 QT Interval:  326 QTC Calculation: 457 R Axis:   31 Text Interpretation:  Atrial fibrillation with rapid ventricular response  When compared with ECG of 05/30/2015, Atrial fibrillation with rapid  ventricular response has replaced Sinus rhythm Confirmed by [facility]  MD,  DAVID ([id number]) on 06/23/2016 2:24:47 AM       3:18 AM CHADsVASc is 1 c/w low stroke/TIA/systemic embolism risk. Case discussed with Dr. Tarri Glenn of cardiology who recommends to proceed with chemical cardioversion. Flecainide ordered.  6:16 AM Rate has improved to 104; remains in Afib. Will continue to monitor.   CRITICAL CARE Performed by: Antony Madura   Total critical care time: 35 minutes  Critical care time was exclusive of separately billable procedures and treating other patients.  Critical care was necessary to treat or prevent imminent or life-threatening deterioration.  Critical care was time spent personally by me on the following activities: development of treatment plan with patient and/or surrogate as well as nursing, discussions with  consultants, evaluation of patient's response to treatment, examination of patient, obtaining history from patient or surrogate, ordering and performing treatments and interventions, ordering and review of laboratory studies, ordering and review of radiographic studies, pulse oximetry and re-evaluation of patient's condition.  MDM   Final diagnoses:  Atrial fibrillation with RVR (HCC)    40 year old male presents to the emergency department for evaluation of shortness of breath and palpitations. He was found to be in new onset atrial fibrillation with RVR. Laboratory workup is reassuring. Case discussed with cardiology as patient seemed to be a good candidate for electrical or pharmacologic cardioversion. Cardiology recommended to proceed with Flecainide. This was given at 3:30 AM.  Rate has improved since arrival, but patient remains in atrial fibrillation. Will continue to monitor in hopes for rate conversion. If patient remains in atrial fibrillation 4 hours after Flecainide dosing, electrical cardioversion may be indicated under sedation. Should the patient convert and be deemed stable for discharge, he will require Rx for Xarelto or Eliquis for outpatient use and referral to the Afib clinic.  Patient signed out to Burna Forts, PA-C at shift change who will assume care and reassess.  Antony Madura, PA-C  06/23/16 1610  Dione Booze, MD 06/23/16 [phone]

## 2016-06-23 NOTE — ED Notes (Signed)
Pt states that he woke up to go to the bathroom and began to have chest pain; pt states that it is pressure that radiates to his left arm and jaw; pt states that it is making his teeth hurt; pt c/o shortness of breath , diaphoresis and lightheadedness

## 2016-06-23 NOTE — ED Provider Notes (Signed)
Charles Huang signed out to me at shift change by Charles MaduraKelly Humes PA-C. Please see previous providers note for full history and physical. Pt presented with palpitations and chest discomfort, noted to be in A-fib. Pt given a dose of flecainide at 0330. Plan at patient care transfer is to monitor for conversion, if patient does not convet within 4 hours with medication he will likely need sedation and electrical cardioversion.   Recheck of patient, he has converted back to normal sinus rhythm, is no acute distress. He will be discharged home ones, cardiology follow-up. Patient given strict return precautions, he verbalized understanding and agreement to today's plan had no further questions or concerns at the time discharge  Filed Vitals:   06/23/16 0530 06/23/16 0724  BP: 127/69 131/99  Pulse: 86 81  Temp:  97.6 F (36.4 C)  Resp: 13 [address]11      Charles Affeldt, PA-C 06/24/16 [id number]  Dione Boozeavid Glick, MD 06/24/16 1500

## 2016-06-25 DIAGNOSIS — Z09 Encounter for follow-up examination after completed treatment for conditions other than malignant neoplasm: Secondary | ICD-10-CM | POA: Diagnosis not present

## 2016-06-25 DIAGNOSIS — Z79899 Other long term (current) drug therapy: Secondary | ICD-10-CM | POA: Diagnosis not present

## 2016-06-25 DIAGNOSIS — I4891 Unspecified atrial fibrillation: Secondary | ICD-10-CM | POA: Diagnosis not present

## 2016-06-25 DIAGNOSIS — E039 Hypothyroidism, unspecified: Secondary | ICD-10-CM | POA: Diagnosis not present

## 2016-06-26 DIAGNOSIS — F4322 Adjustment disorder with anxiety: Secondary | ICD-10-CM | POA: Diagnosis not present

## 2016-06-26 DIAGNOSIS — F54 Psychological and behavioral factors associated with disorders or diseases classified elsewhere: Secondary | ICD-10-CM | POA: Diagnosis not present

## 2016-07-01 DIAGNOSIS — I4891 Unspecified atrial fibrillation: Secondary | ICD-10-CM | POA: Diagnosis not present

## 2016-07-01 DIAGNOSIS — R0789 Other chest pain: Secondary | ICD-10-CM | POA: Diagnosis not present

## 2016-07-01 DIAGNOSIS — R600 Localized edema: Secondary | ICD-10-CM | POA: Diagnosis not present

## 2016-07-01 DIAGNOSIS — M79604 Pain in right leg: Secondary | ICD-10-CM | POA: Diagnosis not present

## 2016-07-01 DIAGNOSIS — R7303 Prediabetes: Secondary | ICD-10-CM | POA: Diagnosis not present

## 2016-07-04 ENCOUNTER — Encounter (HOSPITAL_COMMUNITY): Payer: Self-pay | Admitting: *Deleted

## 2016-07-11 DIAGNOSIS — R0602 Shortness of breath: Secondary | ICD-10-CM | POA: Diagnosis not present

## 2016-07-11 DIAGNOSIS — I4891 Unspecified atrial fibrillation: Secondary | ICD-10-CM | POA: Diagnosis not present

## 2016-07-11 DIAGNOSIS — E559 Vitamin D deficiency, unspecified: Secondary | ICD-10-CM | POA: Diagnosis not present

## 2016-08-07 DIAGNOSIS — F4322 Adjustment disorder with anxiety: Secondary | ICD-10-CM | POA: Diagnosis not present

## 2016-08-07 DIAGNOSIS — F54 Psychological and behavioral factors associated with disorders or diseases classified elsewhere: Secondary | ICD-10-CM | POA: Diagnosis not present

## 2016-08-07 DIAGNOSIS — F17200 Nicotine dependence, unspecified, uncomplicated: Secondary | ICD-10-CM | POA: Diagnosis not present

## 2016-09-11 DIAGNOSIS — F4322 Adjustment disorder with anxiety: Secondary | ICD-10-CM | POA: Diagnosis not present

## 2016-09-11 DIAGNOSIS — F54 Psychological and behavioral factors associated with disorders or diseases classified elsewhere: Secondary | ICD-10-CM | POA: Diagnosis not present

## 2016-09-11 DIAGNOSIS — F17200 Nicotine dependence, unspecified, uncomplicated: Secondary | ICD-10-CM | POA: Diagnosis not present

## 2016-12-17 ENCOUNTER — Encounter (HOSPITAL_COMMUNITY): Payer: Self-pay

## 2016-12-17 ENCOUNTER — Emergency Department (HOSPITAL_COMMUNITY)
Admission: EM | Admit: 2016-12-17 | Discharge: 2016-12-17 | Disposition: A | Discharge status: Home / Self Care | Payer: BLUE CROSS/BLUE SHIELD | Attending: Emergency Medicine | Admitting: Emergency Medicine

## 2016-12-17 ENCOUNTER — Emergency Department (HOSPITAL_COMMUNITY): Payer: BLUE CROSS/BLUE SHIELD

## 2016-12-17 DIAGNOSIS — I10 Essential (primary) hypertension: Secondary | ICD-10-CM | POA: Insufficient documentation

## 2016-12-17 DIAGNOSIS — M2392 Unspecified internal derangement of left knee: Secondary | ICD-10-CM | POA: Diagnosis not present

## 2016-12-17 DIAGNOSIS — Z7901 Long term (current) use of anticoagulants: Secondary | ICD-10-CM | POA: Insufficient documentation

## 2016-12-17 DIAGNOSIS — M25562 Pain in left knee: Secondary | ICD-10-CM | POA: Diagnosis present

## 2016-12-17 DIAGNOSIS — Z87891 Personal history of nicotine dependence: Secondary | ICD-10-CM | POA: Diagnosis not present

## 2016-12-17 HISTORY — DX: Unspecified atrial fibrillation: I48.91

## 2016-12-17 MED ORDER — TRAMADOL HCL 50 MG PO TABS: 100.0000 mg | ORAL_TABLET | Freq: Four times a day (QID) | ORAL | 0 refills | Status: DC | PRN

## 2016-12-17 MED ORDER — TRAMADOL HCL 50 MG PO TABS
100.0000 mg | ORAL_TABLET | Freq: Once | ORAL | Status: AC
  Administered 2016-12-17: 100 mg via ORAL
  Filled 2016-12-17: qty 2

## 2016-12-17 NOTE — ED Provider Notes (Signed)
[facility]-EMERGENCY DEPT Provider Note   CSN: [id number] Arrival date & time: 12/17/16  0809     History   Chief Complaint Chief Complaint  Patient presents with  . Knee Pain    HPI Charles Huang is a 41 y.o. male.  HPI Patient states had long-standing problems with his knees. He reports he has a lot of arthritis. While walking on Sunday he got a sudden pop in his left knee. It occurred on the outside of the knee. Since then he has not felt like the knee is stable. He reports it is painful as well as feeling like it might give out any time. He reports he has been seen by [facility] orthopedics and they won't do anything for it. He reports he was told he is too young to get a knee replacement. He reports he already knows that he has almost bone-on-bone in that knee. Past Medical History:  Diagnosis Date  . Atrial fibrillation (HCC)   . Back pain   . Hyperlipidemia   . Hypertension     There are no active problems to display for this patient.   History reviewed. No pertinent surgical history.     Home Medications    Prior to Admission medications   Medication Sig Start Date End Date Taking? Authorizing Provider  apixaban (ELIQUIS) 5 MG TABS tablet Take 1 tablet (5 mg total) by mouth 2 (two) times daily. 06/23/16   Eyvonne Mechanic, PA-C  atorvastatin (LIPITOR) 10 MG tablet Take 10 mg by mouth daily.    Historical Provider, MD  ibuprofen (ADVIL,MOTRIN) 200 MG tablet Take 600-800 mg by mouth every 6 (six) hours as needed (for pain.).    Historical Provider, MD  lisinopril-hydrochlorothiazide (PRINZIDE,ZESTORETIC) 20-12.5 MG tablet Take 1 tablet by mouth daily. 04/17/16   Historical Provider, MD  naproxen sodium (ANAPROX) 220 MG tablet Take 220 mg by mouth 2 (two) times daily as needed (for pain.).    Historical Provider, MD  traMADol (ULTRAM) 50 MG tablet Take 2 tablets (100 mg total) by mouth every 6 (six) hours as needed. 12/17/16   Arby Barrette, MD    Family History History  reviewed. No pertinent family history.  Social History Social History  Substance Use Topics  . Smoking status: Former Smoker    Quit date: 05/29/2016  . Smokeless tobacco: Never Used  . Alcohol use Yes     Comment: socially     Allergies   Patient has no known allergies.   Review of Systems Review of Systems Constitutional: No fever no chills Respiratory: no cough or short of breath  Physical Exam Updated Vital Signs BP 143/95 (BP Location: Right Arm)   Pulse 86   Temp 97.9 F (36.6 C) (Oral)   Resp 18   Ht 6\' 4"  (1.93 m)   Wt (!) 385 lb (174.6 kg)   SpO2 93%   BMI 46.86 kg/m   Physical Exam  Constitutional: He is oriented to person, place, and time.  Patient is alert and nontoxic. Morbidly obese. No respiratory distress.  Eyes: EOM are normal.  Pulmonary/Chest: Effort normal.  Musculoskeletal: He exhibits tenderness.  Left knee does not have objective effusion to visual inspection. Focal tenderness to lateral joint line. No erythema or warmth. Crepitus with range of motion. No anterior posterior drawer motion. Extraocular fossa soft and nontender.  Neurological: He is alert and oriented to person, place, and time. He exhibits normal muscle tone. Coordination normal.  Skin: Skin is warm and dry.  Psychiatric: He  has a normal mood and affect.     ED Treatments / Results  Labs (all labs ordered are listed, but only abnormal results are displayed) Labs Reviewed - No data to display  EKG  EKG Interpretation None       Radiology No results found.  Procedures Procedures (including critical care time)  Medications Ordered in ED Medications - No data to display   Initial Impression / Assessment and Plan / ED Course  I have reviewed the triage vital signs and the nursing notes.  Pertinent labs & imaging results that were available during my care of the patient were reviewed by me and considered in my medical decision making (see chart for  details).  Clinical Course     Final Clinical Impressions(s) / ED Diagnoses   Final diagnoses:  Internal derangement of left knee  Patient has had long-standing problems with degenerative pain and dysfunction. At this time symptoms are most suggestive of meniscal tear. There is no instability on joint motion but patient does have more discomfort with flexion of the knee and palpation along the lateral joint line. Patient reports [facility] and does not wish to return there since they had not been anything to help him, he is us alternatively given information for [facility] based on  request for alternative follow-up.  New Prescriptions New Prescriptions   TRAMADOL (ULTRAM) 50 MG TABLET    Take 2 tablets (100 mg total) by mouth every 6 (six) hours as needed.     Arby BarretteMarcy Moana Munford, MD 12/17/16 [phone]

## 2016-12-17 NOTE — ED Triage Notes (Signed)
Pt c/o L knee pain x 3 days. Pain score 8/10.  Pt has not taken anything for pain.  Sts he felt "a pop and crack".  Denies injury.  Sts Hx of "degenerative joint disease" in knee.

## 2017-01-12 DIAGNOSIS — B351 Tinea unguium: Secondary | ICD-10-CM | POA: Diagnosis not present

## 2017-01-12 DIAGNOSIS — I481 Persistent atrial fibrillation: Secondary | ICD-10-CM | POA: Diagnosis not present

## 2017-01-12 DIAGNOSIS — I10 Essential (primary) hypertension: Secondary | ICD-10-CM | POA: Diagnosis not present

## 2017-01-12 DIAGNOSIS — M170 Bilateral primary osteoarthritis of knee: Secondary | ICD-10-CM | POA: Diagnosis not present

## 2017-01-19 DIAGNOSIS — L609 Nail disorder, unspecified: Secondary | ICD-10-CM | POA: Diagnosis not present

## 2017-01-19 DIAGNOSIS — M79676 Pain in unspecified toe(s): Secondary | ICD-10-CM | POA: Diagnosis not present

## 2017-01-23 DIAGNOSIS — Z6841 Body Mass Index (BMI) 40.0 and over, adult: Secondary | ICD-10-CM | POA: Diagnosis not present

## 2017-01-23 DIAGNOSIS — M1712 Unilateral primary osteoarthritis, left knee: Secondary | ICD-10-CM | POA: Diagnosis not present

## 2017-02-06 DIAGNOSIS — R948 Abnormal results of function studies of other organs and systems: Secondary | ICD-10-CM | POA: Diagnosis not present

## 2017-02-06 DIAGNOSIS — Z6841 Body Mass Index (BMI) 40.0 and over, adult: Secondary | ICD-10-CM | POA: Diagnosis not present

## 2017-02-10 DIAGNOSIS — Z713 Dietary counseling and surveillance: Secondary | ICD-10-CM | POA: Diagnosis not present

## 2017-02-13 ENCOUNTER — Emergency Department (HOSPITAL_COMMUNITY): Payer: BLUE CROSS/BLUE SHIELD

## 2017-02-13 ENCOUNTER — Emergency Department (HOSPITAL_COMMUNITY)
Admission: EM | Admit: 2017-02-13 | Discharge: 2017-02-13 | Disposition: A | Discharge status: Home / Self Care | Payer: BLUE CROSS/BLUE SHIELD | Attending: Emergency Medicine | Admitting: Emergency Medicine

## 2017-02-13 ENCOUNTER — Encounter (HOSPITAL_COMMUNITY): Payer: Self-pay | Admitting: Emergency Medicine

## 2017-02-13 DIAGNOSIS — Z87891 Personal history of nicotine dependence: Secondary | ICD-10-CM | POA: Insufficient documentation

## 2017-02-13 DIAGNOSIS — R05 Cough: Secondary | ICD-10-CM | POA: Insufficient documentation

## 2017-02-13 DIAGNOSIS — I10 Essential (primary) hypertension: Secondary | ICD-10-CM | POA: Diagnosis not present

## 2017-02-13 DIAGNOSIS — Z5321 Procedure and treatment not carried out due to patient leaving prior to being seen by health care provider: Secondary | ICD-10-CM | POA: Diagnosis not present

## 2017-02-13 LAB — CBC
HCT: 41.9 % (ref 39.0–52.0)
Hemoglobin: 13.8 g/dL (ref 13.0–17.0)
MCH: 26.8 pg (ref 26.0–34.0)
MCHC: 32.9 g/dL (ref 30.0–36.0)
MCV: 81.5 fL (ref 78.0–100.0)
PLATELETS: 244 10*3/uL (ref 150–400)
RBC: 5.14 MIL/uL (ref 4.22–5.81)
RDW: 16.2 % — ABNORMAL HIGH (ref 11.5–15.5)
WBC: 8.4 10*3/uL (ref 4.0–10.5)

## 2017-02-13 LAB — COMPREHENSIVE METABOLIC PANEL
ALT: 58 U/L (ref 17–63)
AST: 46 U/L — ABNORMAL HIGH (ref 15–41)
Albumin: 4.3 g/dL (ref 3.5–5.0)
Alkaline Phosphatase: 53 U/L (ref 38–126)
Anion gap: 7 (ref 5–15)
BILIRUBIN TOTAL: 0.3 mg/dL (ref 0.3–1.2)
BUN: 18 mg/dL (ref 6–20)
CHLORIDE: 103 mmol/L (ref 101–111)
CO2: 27 mmol/L (ref 22–32)
CREATININE: 1.01 mg/dL (ref 0.61–1.24)
Calcium: 9.2 mg/dL (ref 8.9–10.3)
Glucose, Bld: 100 mg/dL — ABNORMAL HIGH (ref 65–99)
Potassium: 3.7 mmol/L (ref 3.5–5.1)
Sodium: 137 mmol/L (ref 135–145)
TOTAL PROTEIN: 7.7 g/dL (ref 6.5–8.1)

## 2017-02-13 LAB — LIPASE, BLOOD: LIPASE: 19 U/L (ref 11–51)

## 2017-02-13 NOTE — ED Triage Notes (Signed)
Pt complaint of flu like symptoms onset today with associated productive cough, chills, generalized aches, and diarrhea. Pt verbalize able to keep food down.

## 2017-02-13 NOTE — ED Notes (Signed)
Patient told Registration he could not wait any longer. Patient left and went home.

## 2017-02-19 DIAGNOSIS — F4322 Adjustment disorder with anxiety: Secondary | ICD-10-CM | POA: Diagnosis not present

## 2017-02-19 DIAGNOSIS — F54 Psychological and behavioral factors associated with disorders or diseases classified elsewhere: Secondary | ICD-10-CM | POA: Diagnosis not present

## 2017-02-19 DIAGNOSIS — F17200 Nicotine dependence, unspecified, uncomplicated: Secondary | ICD-10-CM | POA: Diagnosis not present

## 2017-04-10 DIAGNOSIS — E669 Obesity, unspecified: Secondary | ICD-10-CM | POA: Diagnosis not present

## 2017-04-10 DIAGNOSIS — Z6841 Body Mass Index (BMI) 40.0 and over, adult: Secondary | ICD-10-CM | POA: Diagnosis not present

## 2017-04-10 DIAGNOSIS — R29818 Other symptoms and signs involving the nervous system: Secondary | ICD-10-CM

## 2017-04-10 DIAGNOSIS — R0681 Apnea, not elsewhere classified: Secondary | ICD-10-CM | POA: Diagnosis not present

## 2017-04-10 HISTORY — DX: Other symptoms and signs involving the nervous system: R29.818

## 2017-04-13 ENCOUNTER — Ambulatory Visit: Payer: BLUE CROSS/BLUE SHIELD | Admitting: Family Medicine

## 2017-04-23 DIAGNOSIS — Z713 Dietary counseling and surveillance: Secondary | ICD-10-CM | POA: Diagnosis not present

## 2017-05-04 DIAGNOSIS — E785 Hyperlipidemia, unspecified: Secondary | ICD-10-CM | POA: Diagnosis not present

## 2017-05-04 DIAGNOSIS — Z6841 Body Mass Index (BMI) 40.0 and over, adult: Secondary | ICD-10-CM | POA: Diagnosis not present

## 2017-05-04 DIAGNOSIS — I10 Essential (primary) hypertension: Secondary | ICD-10-CM | POA: Diagnosis not present

## 2017-05-07 ENCOUNTER — Encounter: Payer: Self-pay | Admitting: Family Medicine

## 2017-05-07 ENCOUNTER — Ambulatory Visit (INDEPENDENT_AMBULATORY_CARE_PROVIDER_SITE_OTHER): Payer: BLUE CROSS/BLUE SHIELD | Admitting: Family Medicine

## 2017-05-07 DIAGNOSIS — Z8679 Personal history of other diseases of the circulatory system: Secondary | ICD-10-CM | POA: Insufficient documentation

## 2017-05-07 DIAGNOSIS — I480 Paroxysmal atrial fibrillation: Secondary | ICD-10-CM | POA: Diagnosis not present

## 2017-05-07 DIAGNOSIS — R7303 Prediabetes: Secondary | ICD-10-CM | POA: Insufficient documentation

## 2017-05-07 DIAGNOSIS — I10 Essential (primary) hypertension: Secondary | ICD-10-CM | POA: Diagnosis not present

## 2017-05-07 DIAGNOSIS — E782 Mixed hyperlipidemia: Secondary | ICD-10-CM | POA: Diagnosis not present

## 2017-05-07 MED ORDER — APIXABAN 5 MG PO TABS: 5.0000 mg | ORAL_TABLET | Freq: Two times a day (BID) | ORAL | 1 refills | Status: DC

## 2017-05-07 MED ORDER — LOSARTAN POTASSIUM-HCTZ 100-25 MG PO TABS: 1.0000 | ORAL_TABLET | Freq: Every day | ORAL | 3 refills | Status: DC

## 2017-05-07 MED ORDER — ATORVASTATIN CALCIUM 10 MG PO TABS: 10.0000 mg | ORAL_TABLET | Freq: Every day | ORAL | 3 refills | Status: DC

## 2017-05-07 NOTE — Assessment & Plan Note (Signed)
Hopefully will get approved for Gastric sleeve. Has upcoming labs at follow up.

## 2017-05-07 NOTE — Patient Instructions (Addendum)
We will call with the referral.  Continue the medications as prescribed.  Follow up in 6 months.  Have Dr. Lowell GuitarPowell order the labs.  Dr. Adriana Simasook

## 2017-05-07 NOTE — Assessment & Plan Note (Signed)
At goal. Continue losartan/HCTZ. Has upcoming labs with bariatric surgery next month.

## 2017-05-07 NOTE — Assessment & Plan Note (Signed)
Unsure of control. Continue lipitor. Rx given so that lipid panel can be obtained when he gets labs from bariatric surgery office.

## 2017-05-07 NOTE — Progress Notes (Signed)
Subjective:  Patient ID: Charles Huang, male    DOB: [birth year]  Age: 41 y.o. MRN: [id number]  CC: Establish care  HPI Charles Huang is a 41 y.o. male presents to the clinic today to establish care. Issues are below.  HTN  Well controlled on Losartan HCTZ. Needs refill.  HLD  Unsure of control.  Needs refill on lipitor.  Paroxysmal afib  Hx of A. fib following excessive alcohol intake and likely dehydration.  He is currently on Eliquis.  He has seen cardiology and [place]. He has not been seen recently.  Morbid obesity  Currently seeing bariatric surgery and is in the process of getting bariatric surgery.   PMH, Surgical Hx, Family Hx, Social History reviewed and updated as below.  Past Medical History:  Diagnosis Date  . Arthritis   . Atrial fibrillation (HCC)   . Hyperlipidemia   . Hypertension   . Prediabetes    Past Surgical History:  Procedure Laterality Date  . NO PAST SURGERIES     Family History  Problem Relation Age of Onset  . Arthritis Mother   . Hyperlipidemia Mother   . Stroke Mother    Social History  Substance Use Topics  . Smoking status: Former Smoker    Quit date: 05/29/2016  . Smokeless tobacco: Never Used  . Alcohol use Yes     Comment: socially    Review of Systems  Musculoskeletal:       Knee pain.  All other systems reviewed and are negative.   Objective:   Today's Vitals: BP 128/84 (BP Location: Left Arm, Patient Position: Sitting, Cuff Size: Large)   Pulse (!) 103   Temp 98.6 F (37 C) (Oral)   Resp 18   Ht 6\' 3"  (1.905 m)   Wt (!) 387 lb (175.5 kg)   SpO2 97%   BMI 48.37 kg/m   Physical Exam  Constitutional: He is oriented to person, place, and time.  Morbidly obese male in no acute distress.  HENT:  Head: Normocephalic and atraumatic.  Mouth/Throat: Oropharynx is clear and moist.  Eyes: Conjunctivae are normal. No scleral icterus.  Neck: Neck supple. No thyromegaly present.  Cardiovascular:    Tachycardia. Regular rhythm.  Pulmonary/Chest: Effort normal and breath sounds normal. He has no wheezes. He has no rales.  Abdominal: Soft. He exhibits no distension. There is no tenderness. There is no rebound and no guarding.  Neurological: He is alert and oriented to person, place, and time.  Skin: Skin is warm. No rash noted.  Psychiatric: He has a normal mood and affect.  Vitals reviewed.  Assessment & Plan:   Problem List Items Addressed This Visit    Paroxysmal A-fib (HCC)    In sinus rhythm today. Continue eliquis. Needs to see cardiology. Arranging referral.       Relevant Medications   losartan-hydrochlorothiazide (HYZAAR) 100-25 MG tablet   atorvastatin (LIPITOR) 10 MG tablet   apixaban (ELIQUIS) 5 MG TABS tablet   Morbid obesity (HCC)    Hopefully will get approved for Gastric sleeve. Has upcoming labs at follow up.      Mixed hyperlipidemia    Unsure of control. Continue lipitor. Rx given so that lipid panel can be obtained when he gets labs from bariatric surgery office.      Relevant Medications   losartan-hydrochlorothiazide (HYZAAR) 100-25 MG tablet   atorvastatin (LIPITOR) 10 MG tablet   apixaban (ELIQUIS) 5 MG TABS tablet   Essential hypertension    At goal.  Continue losartan/HCTZ. Has upcoming labs with bariatric surgery next month.      Relevant Medications   losartan-hydrochlorothiazide (HYZAAR) 100-25 MG tablet   atorvastatin (LIPITOR) 10 MG tablet   apixaban (ELIQUIS) 5 MG TABS tablet      Meds ordered this encounter  Medications  . DISCONTD: losartan-hydrochlorothiazide (HYZAAR) 100-25 MG tablet    Sig: Take 1 tablet by mouth daily.   Marland Kitchen. DISCONTD: apixaban (ELIQUIS) 5 MG TABS tablet    Sig: Take 1 tablet (5 mg total) by mouth 2 (two) times daily.    Dispense:  180 tablet    Refill:  1  . losartan-hydrochlorothiazide (HYZAAR) 100-25 MG tablet    Sig: Take 1 tablet by mouth daily.    Dispense:  90 tablet    Refill:  3  .  atorvastatin (LIPITOR) 10 MG tablet    Sig: Take 1 tablet (10 mg total) by mouth daily.    Dispense:  90 tablet    Refill:  3  . apixaban (ELIQUIS) 5 MG TABS tablet    Sig: Take 1 tablet (5 mg total) by mouth 2 (two) times daily.    Dispense:  180 tablet    Refill:  1     Follow-up: Return in about 6 months (around 11/07/2017).  Everlene OtherJayce Trinda Harlacher DO [facility] [place]

## 2017-05-07 NOTE — Assessment & Plan Note (Signed)
In sinus rhythm today. Continue eliquis. Needs to see cardiology. Arranging referral.

## 2017-06-01 DIAGNOSIS — Z6841 Body Mass Index (BMI) 40.0 and over, adult: Secondary | ICD-10-CM | POA: Diagnosis not present

## 2017-06-01 DIAGNOSIS — Z713 Dietary counseling and surveillance: Secondary | ICD-10-CM | POA: Diagnosis not present

## 2017-06-12 ENCOUNTER — Telehealth: Payer: Self-pay | Admitting: Family Medicine

## 2017-06-12 NOTE — Telephone Encounter (Signed)
Reason for call: abscessed tooth Symptoms: abscessed bottom left portion of mouth in middle of mouth ,describes pain on off as throbbing, no drainage coming from tooth or gum  Duration 1 week  Medications: ibuprofen  Patient wants to know if you will call in antibiotic  No local dentist   Patient wants to know If you can review labs from 06[facility]01[facility]17 [facility] .

## 2017-06-12 NOTE — Telephone Encounter (Signed)
He stated that he is a Bariatric patient and he had labs w done and would like the results. He also has an abscessed tooth and wants to see if the physician would prescribe an antibiotic.

## 2017-06-12 NOTE — Telephone Encounter (Signed)
I don't have his labs. He will need to be seen for the tooth. Recommend Urgent care eval.

## 2017-06-15 ENCOUNTER — Other Ambulatory Visit: Payer: Self-pay | Admitting: Family Medicine

## 2017-06-15 DIAGNOSIS — I480 Paroxysmal atrial fibrillation: Secondary | ICD-10-CM

## 2017-06-15 NOTE — Telephone Encounter (Signed)
Referral in

## 2017-06-15 NOTE — Telephone Encounter (Signed)
Patient advised on 06/12/17 forgot to document

## 2017-06-15 NOTE — Telephone Encounter (Signed)
Will call for lab results from Dr Roanna BanningPowell's office [phone], requested records

## 2017-06-15 NOTE — Telephone Encounter (Signed)
Patient states he was to be referred to cardiology.  According to last office visit.

## 2017-06-16 NOTE — Telephone Encounter (Signed)
Referral sent to [facility] office

## 2017-07-16 DIAGNOSIS — F17210 Nicotine dependence, cigarettes, uncomplicated: Secondary | ICD-10-CM | POA: Diagnosis not present

## 2017-07-16 DIAGNOSIS — F4322 Adjustment disorder with anxiety: Secondary | ICD-10-CM | POA: Diagnosis not present

## 2017-07-16 DIAGNOSIS — Z6841 Body Mass Index (BMI) 40.0 and over, adult: Secondary | ICD-10-CM | POA: Diagnosis not present

## 2017-07-17 ENCOUNTER — Ambulatory Visit: Payer: BLUE CROSS/BLUE SHIELD | Admitting: Cardiology

## 2017-07-19 DIAGNOSIS — E669 Obesity, unspecified: Secondary | ICD-10-CM | POA: Diagnosis not present

## 2017-07-19 DIAGNOSIS — Z6841 Body Mass Index (BMI) 40.0 and over, adult: Secondary | ICD-10-CM | POA: Diagnosis not present

## 2017-07-19 DIAGNOSIS — G4733 Obstructive sleep apnea (adult) (pediatric): Secondary | ICD-10-CM | POA: Diagnosis not present

## 2017-07-19 DIAGNOSIS — R0681 Apnea, not elsewhere classified: Secondary | ICD-10-CM | POA: Diagnosis not present

## 2017-08-13 IMAGING — CR DG CHEST 2V
2 series · 2 of 2 positions shown · non-contrast
Comparison: 06/23/2016 chest radiograph

CLINICAL DATA: 40 y/o  M; 2 days of cough.

EXAM:
CHEST  2 VIEW

[w chest pa]
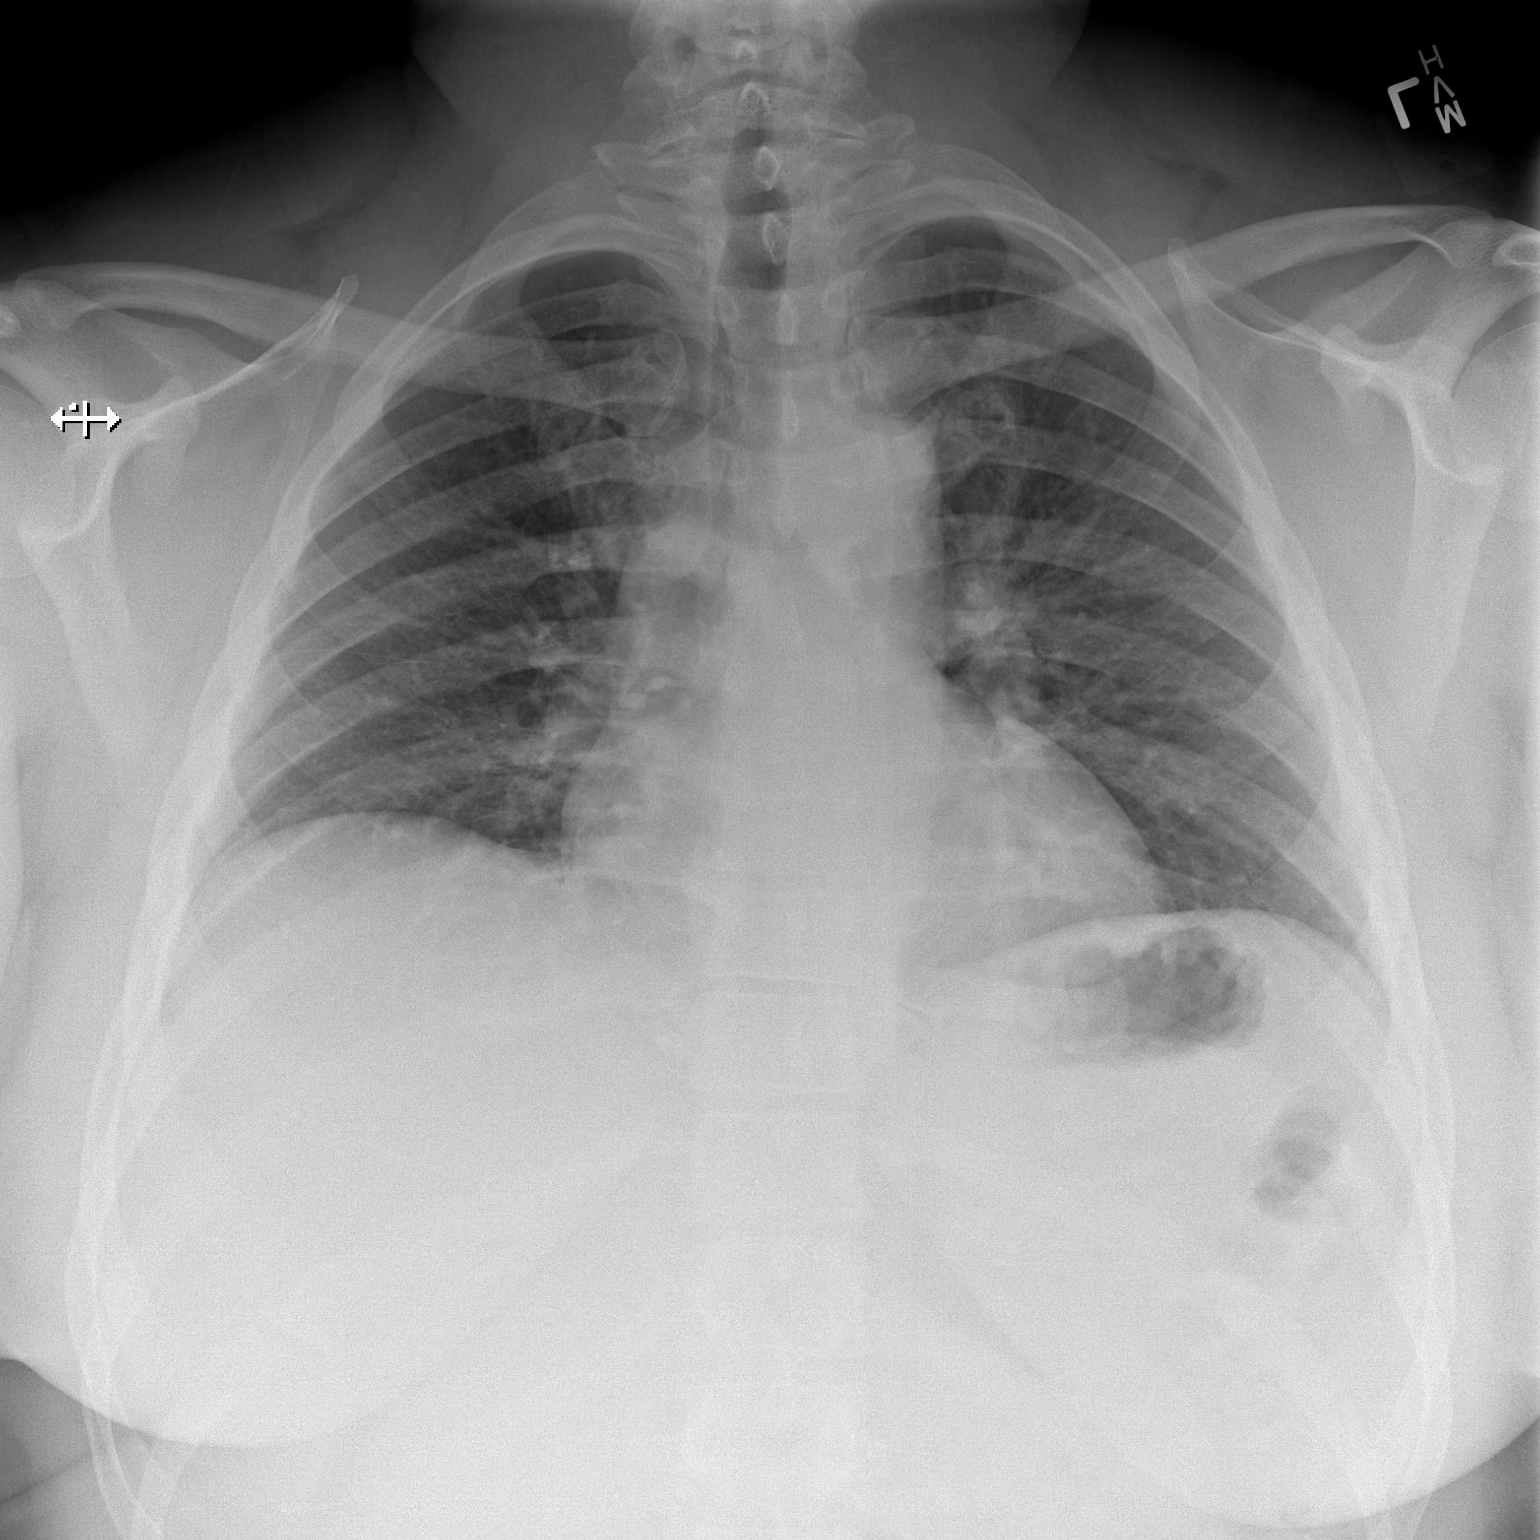

[w chest lat]
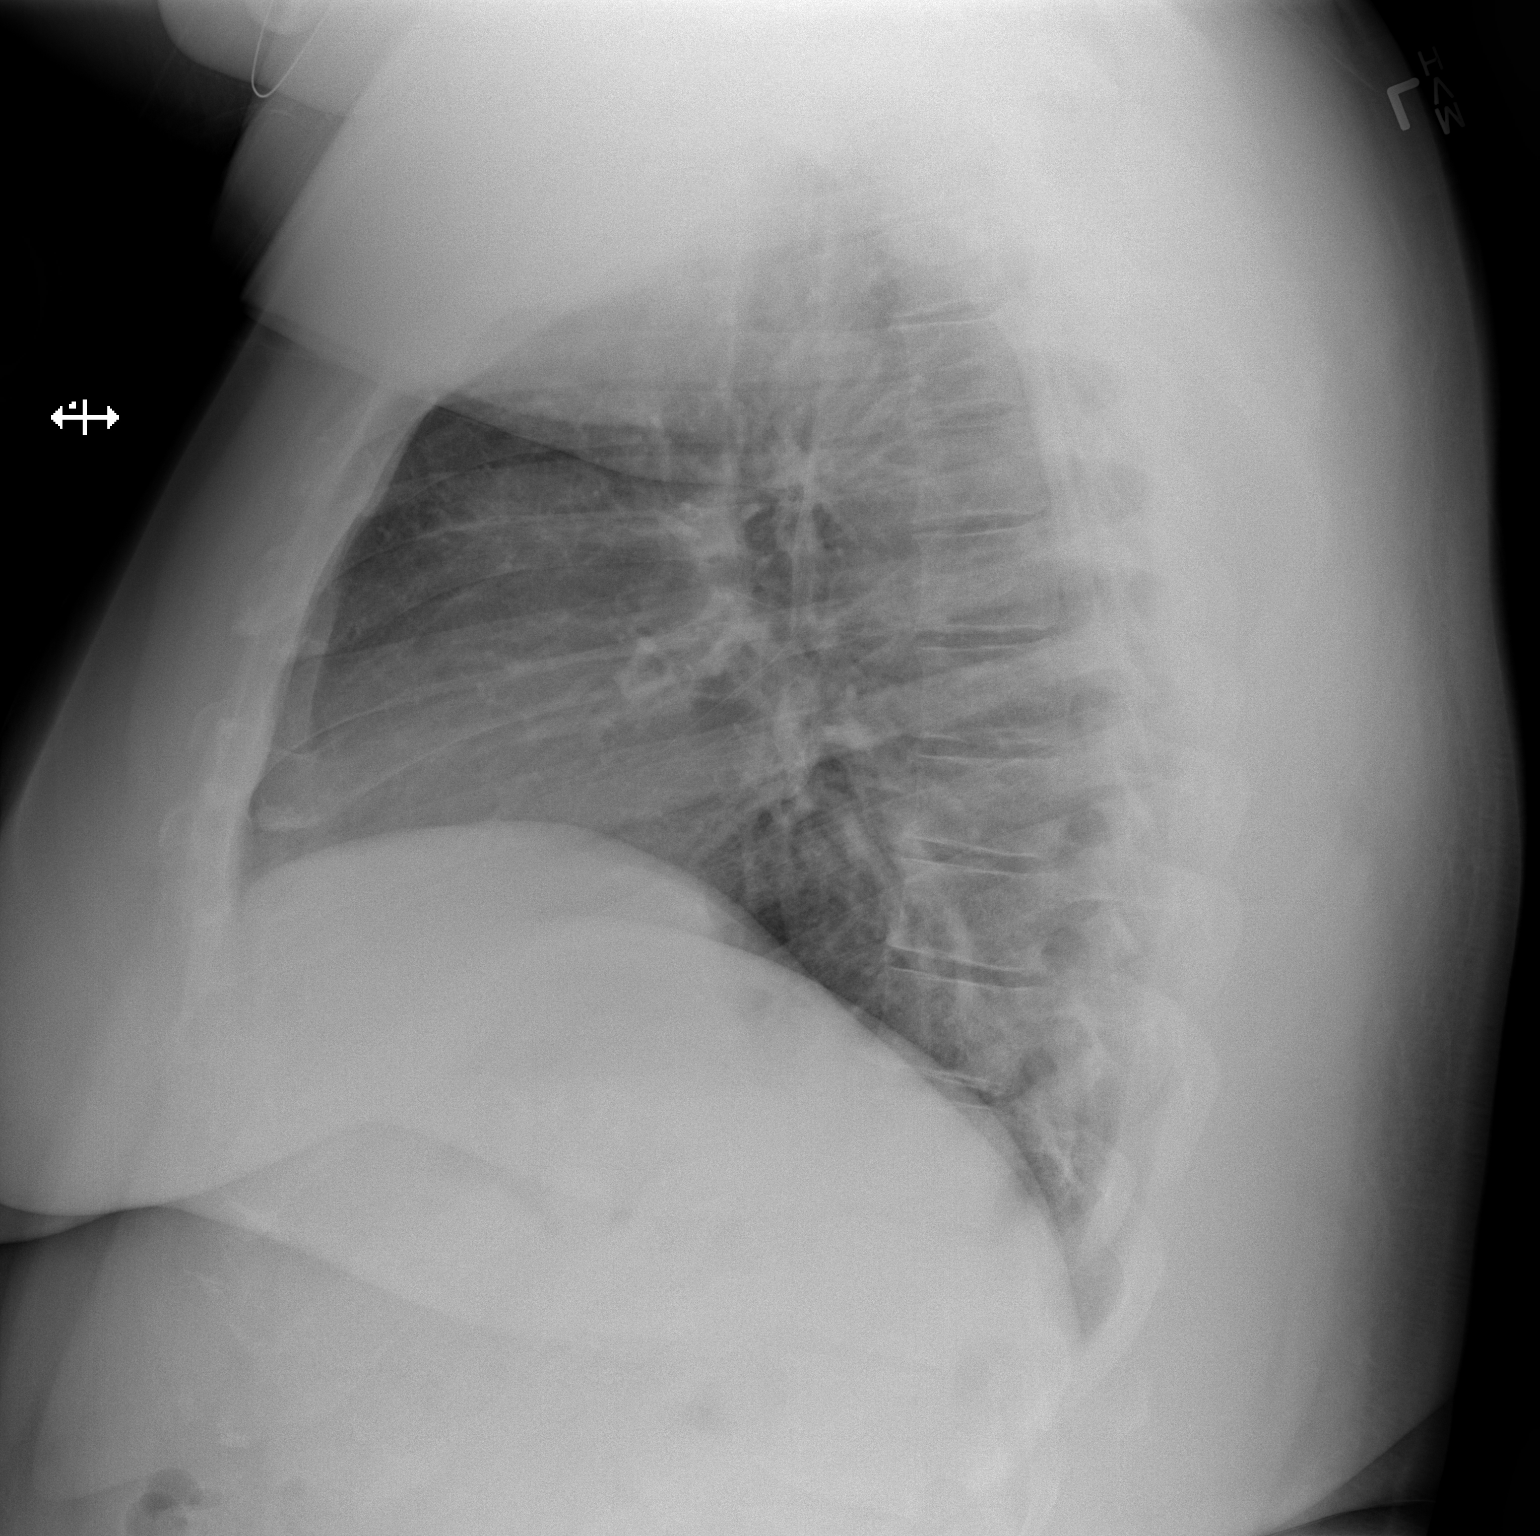

[2 of 2 positions shown; findings below may reference images not displayed]

FINDINGS: The heart size and mediastinal contours are within normal limits.
Low lung volumes. Both lungs are clear. The visualized skeletal
structures are unremarkable.
IMPRESSION: No active cardiopulmonary disease.  Low lung volumes.

By: Qi Mui M.D.

## 2017-11-11 ENCOUNTER — Ambulatory Visit: Payer: BLUE CROSS/BLUE SHIELD | Admitting: Family Medicine

## 2017-11-12 ENCOUNTER — Ambulatory Visit: Payer: BLUE CROSS/BLUE SHIELD | Admitting: Family Medicine

## 2017-12-09 DIAGNOSIS — M545 Low back pain: Secondary | ICD-10-CM | POA: Diagnosis not present

## 2018-01-14 DIAGNOSIS — Z713 Dietary counseling and surveillance: Secondary | ICD-10-CM | POA: Diagnosis not present

## 2018-01-14 DIAGNOSIS — Z720 Tobacco use: Secondary | ICD-10-CM | POA: Diagnosis not present

## 2018-01-14 DIAGNOSIS — Z6841 Body Mass Index (BMI) 40.0 and over, adult: Secondary | ICD-10-CM | POA: Diagnosis not present

## 2018-02-09 ENCOUNTER — Ambulatory Visit (INDEPENDENT_AMBULATORY_CARE_PROVIDER_SITE_OTHER): Payer: BLUE CROSS/BLUE SHIELD

## 2018-02-09 ENCOUNTER — Encounter: Payer: Self-pay | Admitting: Internal Medicine

## 2018-02-09 ENCOUNTER — Ambulatory Visit (INDEPENDENT_AMBULATORY_CARE_PROVIDER_SITE_OTHER): Payer: BLUE CROSS/BLUE SHIELD | Admitting: Internal Medicine

## 2018-02-09 VITALS — BP 134/78 | HR 83 | Temp 98.9°F | Resp 16 | Ht 75.0 in | Wt 386.4 lb

## 2018-02-09 DIAGNOSIS — R739 Hyperglycemia, unspecified: Secondary | ICD-10-CM | POA: Diagnosis not present

## 2018-02-09 DIAGNOSIS — M25562 Pain in left knee: Secondary | ICD-10-CM

## 2018-02-09 DIAGNOSIS — E119 Type 2 diabetes mellitus without complications: Secondary | ICD-10-CM | POA: Diagnosis not present

## 2018-02-09 DIAGNOSIS — M545 Low back pain, unspecified: Secondary | ICD-10-CM | POA: Insufficient documentation

## 2018-02-09 DIAGNOSIS — G8929 Other chronic pain: Secondary | ICD-10-CM | POA: Diagnosis not present

## 2018-02-09 DIAGNOSIS — M5440 Lumbago with sciatica, unspecified side: Secondary | ICD-10-CM | POA: Diagnosis not present

## 2018-02-09 DIAGNOSIS — M25561 Pain in right knee: Secondary | ICD-10-CM | POA: Diagnosis not present

## 2018-02-09 DIAGNOSIS — E782 Mixed hyperlipidemia: Secondary | ICD-10-CM

## 2018-02-09 DIAGNOSIS — I10 Essential (primary) hypertension: Secondary | ICD-10-CM | POA: Diagnosis not present

## 2018-02-09 DIAGNOSIS — M1712 Unilateral primary osteoarthritis, left knee: Secondary | ICD-10-CM | POA: Diagnosis not present

## 2018-02-09 DIAGNOSIS — R29818 Other symptoms and signs involving the nervous system: Secondary | ICD-10-CM | POA: Diagnosis not present

## 2018-02-09 DIAGNOSIS — M1711 Unilateral primary osteoarthritis, right knee: Secondary | ICD-10-CM | POA: Diagnosis not present

## 2018-02-09 DIAGNOSIS — Z1329 Encounter for screening for other suspected endocrine disorder: Secondary | ICD-10-CM

## 2018-02-09 DIAGNOSIS — M170 Bilateral primary osteoarthritis of knee: Secondary | ICD-10-CM

## 2018-02-09 DIAGNOSIS — Z113 Encounter for screening for infections with a predominantly sexual mode of transmission: Secondary | ICD-10-CM

## 2018-02-09 DIAGNOSIS — I480 Paroxysmal atrial fibrillation: Secondary | ICD-10-CM

## 2018-02-09 DIAGNOSIS — I4891 Unspecified atrial fibrillation: Secondary | ICD-10-CM

## 2018-02-09 HISTORY — DX: Low back pain, unspecified: M54.50

## 2018-02-09 LAB — URINALYSIS, ROUTINE W REFLEX MICROSCOPIC
BILIRUBIN URINE: NEGATIVE
Ketones, ur: NEGATIVE
Leukocytes, UA: NEGATIVE
NITRITE: NEGATIVE
Specific Gravity, Urine: 1.02 (ref 1.000–1.030)
TOTAL PROTEIN, URINE-UPE24: NEGATIVE
URINE GLUCOSE: NEGATIVE
UROBILINOGEN UA: 0.2 (ref 0.0–1.0)
pH: 6.5 (ref 5.0–8.0)

## 2018-02-09 LAB — CBC WITH DIFFERENTIAL/PLATELET
BASOS ABS: 0 10*3/uL (ref 0.0–0.1)
Basophils Relative: 0.5 % (ref 0.0–3.0)
EOS ABS: 0.1 10*3/uL (ref 0.0–0.7)
Eosinophils Relative: 1.1 % (ref 0.0–5.0)
HEMATOCRIT: 42.5 % (ref 39.0–52.0)
Hemoglobin: 13.9 g/dL (ref 13.0–17.0)
LYMPHS PCT: 24.7 % (ref 12.0–46.0)
Lymphs Abs: 1.8 10*3/uL (ref 0.7–4.0)
MCHC: 32.8 g/dL (ref 30.0–36.0)
MCV: 83.2 fl (ref 78.0–100.0)
MONOS PCT: 6.1 % (ref 3.0–12.0)
Monocytes Absolute: 0.5 10*3/uL (ref 0.1–1.0)
NEUTROS ABS: 5 10*3/uL (ref 1.4–7.7)
Neutrophils Relative %: 67.6 % (ref 43.0–77.0)
Platelets: 240 10*3/uL (ref 150.0–400.0)
RBC: 5.1 Mil/uL (ref 4.22–5.81)
RDW: 15.7 % — ABNORMAL HIGH (ref 11.5–15.5)
WBC: 7.4 10*3/uL (ref 4.0–10.5)

## 2018-02-09 LAB — COMPREHENSIVE METABOLIC PANEL
ALBUMIN: 4.2 g/dL (ref 3.5–5.2)
ALT: 41 U/L (ref 0–53)
AST: 36 U/L (ref 0–37)
Alkaline Phosphatase: 50 U/L (ref 39–117)
BILIRUBIN TOTAL: 0.3 mg/dL (ref 0.2–1.2)
BUN: 13 mg/dL (ref 6–23)
CALCIUM: 9.8 mg/dL (ref 8.4–10.5)
CHLORIDE: 103 meq/L (ref 96–112)
CO2: 33 meq/L — AB (ref 19–32)
CREATININE: 1.2 mg/dL (ref 0.40–1.50)
GFR: 85.58 mL/min (ref >60.00)
Glucose, Bld: 93 mg/dL (ref 70–99)
Potassium: 4 mEq/L (ref 3.5–5.1)
Sodium: 141 mEq/L (ref 135–145)
Total Protein: 7.5 g/dL (ref 6.0–8.3)

## 2018-02-09 LAB — HEMOGLOBIN A1C: Hgb A1c MFr Bld: 6.5 % (ref 4.6–6.5)

## 2018-02-09 LAB — TSH: TSH: 1.03 u[IU]/mL (ref 0.35–4.50)

## 2018-02-09 LAB — T4, FREE: Free T4: 0.6 ng/dL (ref 0.60–1.60)

## 2018-02-09 MED ORDER — TRAMADOL HCL 50 MG PO TABS: 50.0000 mg | ORAL_TABLET | Freq: Two times a day (BID) | ORAL | 0 refills | Status: DC | PRN

## 2018-02-09 MED ORDER — PREDNISONE 20 MG PO TABS: 40.0000 mg | ORAL_TABLET | Freq: Every day | ORAL | 0 refills | Status: DC

## 2018-02-09 NOTE — Patient Instructions (Addendum)
2 months   Knee Pain, Adult Knee pain in adults is common. It can be caused by many things, including:  Arthritis.  A fluid-filled sac (cyst) or growth in your knee.  An infection in your knee.  An injury that will not heal.  Damage, swelling, or irritation of the tissues that support your knee.  Knee pain is usually not a sign of a serious problem. The pain may go away on its own with time and rest. If it does not, a health care provider may order tests to find the cause of the pain. These may include:  Imaging tests, such as an X-ray, MRI, or ultrasound.  Joint aspiration. In this test, fluid is removed from the knee.  Arthroscopy. In this test, a lighted tube is inserted into knee and an image is projected onto a TV screen.  A biopsy. In this test, a sample of tissue is removed from the body and studied under a microscope.  Follow these instructions at home: Pay attention to any changes in your symptoms. Take these actions to relieve your pain. Activity  Rest your knee.  Do not do things that cause pain or make pain worse.  Avoid high-impact activities or exercises, such as running, jumping rope, or doing jumping jacks. General instructions  Take over-the-counter and prescription medicines only as told by your health care provider.  Raise (elevate) your knee above the level of your heart when you are sitting or lying down.  Sleep with a pillow under your knee.  If directed, apply ice to the knee: ? Put ice in a plastic bag. ? Place a towel between your skin and the bag. ? Leave the ice on for 20 minutes, 2-3 times a day.  Ask your health care provider if you should wear an elastic knee support.  Lose weight if you are overweight. Extra weight can put pressure on your knee.  Do not use any products that contain nicotine or tobacco, such as cigarettes and e-cigarettes. Smoking may slow the healing of any bone and joint problems that you may have. If you need help  quitting, ask your health care provider. Contact a health care provider if:  Your knee pain continues, changes, or gets worse.  You have a fever along with knee pain.  Your knee buckles or locks up.  Your knee swells, and the swelling becomes worse. Get help right away if:  Your knee feels warm to the touch.  You cannot move your knee.  You have severe pain in your knee.  You have chest pain.  You have trouble breathing. Summary  Knee pain in adults is common. It can be caused by many things, including, arthritis, infection, cysts, or injury.  Knee pain is usually not a sign of a serious problem, but if it does not go away, a health care provider may perform tests to know the cause of the pain.  Pay attention to any changes in your symptoms. Relieve your pain with rest, medicines, light activity, and use of ice.  Get help if your pain continues or becomes very severe, or if your knee buckles or locks up, or if you have chest pain or trouble breathing. This information is not intended to replace advice given to you by your health care provider. Make sure you discuss any questions you have with your health care provider. Document Released: 09/28/2007 Document Revised: 11/21/2016 Document Reviewed: 11/21/2016 Elsevier Interactive Patient Education  2018 [employer].  Back Pain, Adult Many  adults have back pain from time to time. Common causes of back pain include:  A strained muscle or ligament.  Wear and tear (degeneration) of the spinal disks.  Arthritis.  A hit to the back.  Back pain can be short-lived (acute) or last a long time (chronic). A physical exam, lab tests, and imaging studies may be done to find the cause of your pain. Follow these instructions at home: Managing pain and stiffness  Take over-the-counter and prescription medicines only as told by your health care provider.  If directed, apply heat to the affected area as often as told by your health  care provider. Use the heat source that your health care provider recommends, such as a moist heat pack or a heating pad. ? Place a towel between your skin and the heat source. ? Leave the heat on for 20-30 minutes. ? Remove the heat if your skin turns bright red. This is especially important if you are unable to feel pain, heat, or cold. You have a greater risk of getting burned.  If directed, apply ice to the injured area: ? Put ice in a plastic bag. ? Place a towel between your skin and the bag. ? Leave the ice on for 20 minutes, 2-3 times a day for the first 2-3 days. Activity  Do not stay in bed. Resting more than 1-2 days can delay your recovery.  Take short walks on even surfaces as soon as you are able. Try to increase the length of time you walk each day.  Do not sit, drive, or stand in one place for more than 30 minutes at a time. Sitting or standing for long periods of time can put stress on your back.  Use proper lifting techniques. When you bend and lift, use positions that put less stress on your back: ? [place]end your knees. ? Keep the load close to your body. ? Avoid twisting.  Exercise regularly as told by your health care provider. Exercising will help your back heal faster. This also helps prevent back injuries by keeping muscles strong and flexible.  Your health care provider may recommend that you see a physical therapist. This person can help you come up with a safe exercise program. Do any exercises as told by your physical therapist. Lifestyle  Maintain a healthy weight. Extra weight puts stress on your back and makes it difficult to have good posture.  Avoid activities or situations that make you feel anxious or stressed. Learn ways to manage anxiety and stress. One way to manage stress is through exercise. Stress and anxiety increase muscle tension and can make back pain worse. General instructions  Sleep on a firm mattress in a comfortable position. Try lying on  your side with your knees slightly bent. If you lie on your back, put a pillow under your knees.  Follow your treatment plan as told by your health care provider. This may include: ? Cognitive or behavioral therapy. ? Acupuncture or massage therapy. ? Meditation or yoga. Contact a health care provider if:  You have pain that is not relieved with rest or medicine.  You have increasing pain going down into your legs or buttocks.  Your pain does not improve in 2 weeks.  You have pain at night.  You lose weight.  You have a fever or chills. Get help right away if:  You develop new bowel or bladder control problems.  You have unusual weakness or numbness in your arms or legs.  You develop nausea or vomiting.  You develop abdominal pain.  You feel faint. Summary  Many adults have back pain from time to time. A physical exam, lab tests, and imaging studies may be done to find the cause of your pain.  Use proper lifting techniques. When you bend and lift, use positions that put less stress on your back.  Take over-the-counter and prescription medicines and apply heat or ice as directed by your health care provider. This information is not intended to replace advice given to you by your health care provider. Make sure you discuss any questions you have with your health care provider. Document Released: 12/01/2005 Document Revised: 01/05/2017 Document Reviewed: 01/05/2017 Elsevier Interactive Patient Education  [employer].

## 2018-02-10 ENCOUNTER — Telehealth: Payer: Self-pay

## 2018-02-10 ENCOUNTER — Other Ambulatory Visit: Payer: Self-pay | Admitting: Internal Medicine

## 2018-02-10 DIAGNOSIS — M5440 Lumbago with sciatica, unspecified side: Secondary | ICD-10-CM

## 2018-02-10 DIAGNOSIS — G8929 Other chronic pain: Secondary | ICD-10-CM

## 2018-02-10 DIAGNOSIS — M25561 Pain in right knee: Principal | ICD-10-CM

## 2018-02-10 DIAGNOSIS — M25562 Pain in left knee: Principal | ICD-10-CM

## 2018-02-10 LAB — HIV ANTIBODY (ROUTINE TESTING W REFLEX): HIV: NONREACTIVE

## 2018-02-10 MED ORDER — TRAMADOL HCL 50 MG PO TABS: 50.0000 mg | ORAL_TABLET | Freq: Two times a day (BID) | ORAL | 0 refills | Status: DC | PRN

## 2018-02-10 NOTE — Telephone Encounter (Signed)
Ok to place refill

## 2018-02-10 NOTE — Telephone Encounter (Signed)
Patient states he was seen on 02/09/18 by Dr.Mclean and believes he lost his paper rx for tramadol while he was in xray or lab. Please advise. [place] [facility] [place]

## 2018-02-10 NOTE — Telephone Encounter (Signed)
On printer please fax will sign

## 2018-02-10 NOTE — Telephone Encounter (Signed)
Medication has been faxed

## 2018-02-14 ENCOUNTER — Encounter: Payer: Self-pay | Admitting: Internal Medicine

## 2018-02-14 NOTE — Progress Notes (Signed)
Chief Complaint  Patient presents with  . Establish Care   Follow up  1. Charles Huang c/o b/l knee pain chronic and low back pain. He has seen ortho for knee pain in the past and had steroid injections but surgery was not rec. Until he lost wt. Last seen ortho and had shots 2 years ago.   He has had intermittent low back pain over the years and yesterday back pain started. Xray 2015 low back negative.  He did have an episode of low back pain 12/2017 and pain today is 7/10. He has tried Ibuprofen 800 mg bid to qid. He reports he does not want to try strong narcotics and Charles Huang in the past has not helped. Back pain does go down his legs  He reports he has been out of work and would like FMLA paperwork filled out when he gets it to [place] for joint pain. 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 Obesity seeing [facility] appt 02/12/18 and eating 1300 calories per day x 1.5 weeks. He will need wt loss surgery and in the process of getting approved 3. H/o Afib 06/19/16 in the ED but stopped eliquis x 6-7 months 5 mg bid. I  rec he see cardiology to clear him before surgery and make sure does not need to continue Eliquis for Afib history  4. H/o suspected OSA not using CPAP b/c per Charles Huang there was confusion as to if he had OSA as not apnea was recorded during the 7 hours he slept.  need to get records sleep study from [facility].  5. HTN controlled on Hyzaar 100-25.     Review of Systems  Constitutional: Negative for weight loss.  HENT: Negative for hearing loss.   Eyes: Negative for blurred vision.  Respiratory: Negative for shortness of breath.   Cardiovascular: Negative for chest pain and palpitations.  Gastrointestinal: Negative for abdominal pain.  Musculoskeletal: Positive for back pain and joint pain.       B/l knee chronic  Low back pain   Skin: Negative for rash.  Neurological: Negative for headaches.  Psychiatric/Behavioral: Negative for memory loss.   Past Medical History:  Diagnosis Date  .  Arthritis   . Atrial fibrillation (HCC)   . Hyperlipidemia   . Hypertension   . Prediabetes    Past Surgical History:  Procedure Laterality Date  . NO PAST SURGERIES     Family History  Problem Relation Age of Onset  . Arthritis Mother   . Hyperlipidemia Mother   . Stroke Mother    Social History   Socioeconomic History  . Marital status: Single    Spouse name: Not on file  . Number of children: Not on file  . Years of education: Not on file  . Highest education level: Not on file  Social Needs  . Financial resource strain: Not on file  . Food insecurity - worry: Not on file  . Food insecurity - inability: Not on file  . Transportation needs - medical: Not on file  . Transportation needs - non-medical: Not on file  Occupational History  . Not on file  Tobacco Use  . Smoking status: Former Smoker    Last attempt to quit: 05/29/2016    Years since quitting: 1.7  . Smokeless tobacco: Never Used  Substance and Sexual Activity  . Alcohol use: Yes    Comment: socially  . Drug use: No  . Sexual activity: Yes    Birth  control/protection: Condom  Other Topics Concern  . Not on file  Social History Narrative  . Not on file   Current Meds  Medication Sig  . atorvastatin (LIPITOR) 10 MG tablet Take 1 tablet (10 mg total) by mouth daily.  Marland Kitchen losartan-hydrochlorothiazide (HYZAAR) 100-25 MG tablet Take 1 tablet by mouth daily.   No Known Allergies Recent Results (from the past 2160 hour(s))  Comprehensive metabolic panel     Status: Abnormal   Collection Time: 02/09/18  2:06 PM  Result Value Ref Range   Sodium 141 135 - 145 mEq/L   Potassium 4.0 3.5 - 5.1 mEq/L   Chloride 103 96 - 112 mEq/L   CO2 33 (H) 19 - 32 mEq/L   Glucose, Bld 93 70 - 99 mg/dL   BUN 13 6 - 23 mg/dL   Creatinine, Ser 9.60 0.40 - 1.50 mg/dL   Total Bilirubin 0.3 0.2 - 1.2 mg/dL   Alkaline Phosphatase 50 39 - 117 U/L   AST 36 0 - 37 U/L   ALT 41 0 - 53 U/L   Total Protein 7.5 6.0 - 8.3 g/dL    Albumin 4.2 3.5 - 5.2 g/dL   Calcium 9.8 8.4 - 45.4 mg/dL   GFR 09.81 >19.14 mL/min  CBC with Differential/Platelet     Status: Abnormal   Collection Time: 02/09/18  2:06 PM  Result Value Ref Range   WBC 7.4 4.0 - 10.5 K/uL   RBC 5.10 4.22 - 5.81 Mil/uL   Hemoglobin 13.9 13.0 - 17.0 g/dL   HCT 78.2 95.6 - 21.3 %   MCV 83.2 78.0 - 100.0 fl   MCHC 32.8 30.0 - 36.0 g/dL   RDW 08.6 (H) 57.8 - 46.9 %   Platelets 240.0 150.0 - 400.0 K/uL   Neutrophils Relative % 67.6 43.0 - 77.0 %   Lymphocytes Relative 24.7 12.0 - 46.0 %   Monocytes Relative 6.1 3.0 - 12.0 %   Eosinophils Relative 1.1 0.0 - 5.0 %   Basophils Relative 0.5 0.0 - 3.0 %   Neutro Abs 5.0 1.4 - 7.7 K/uL   Lymphs Abs 1.8 0.7 - 4.0 K/uL   Monocytes Absolute 0.5 0.1 - 1.0 K/uL   Eosinophils Absolute 0.1 0.0 - 0.7 K/uL   Basophils Absolute 0.0 0.0 - 0.1 K/uL  Hemoglobin A1c     Status: None   Collection Time: 02/09/18  2:06 PM  Result Value Ref Range   Hgb A1c MFr Bld 6.5 4.6 - 6.5 %    Comment: Glycemic Control Guidelines for People with Diabetes:Non Diabetic:  <6%Goal of Therapy: <7%Additional Action Suggested:  >8%   TSH     Status: None   Collection Time: 02/09/18  2:06 PM  Result Value Ref Range   TSH 1.03 0.35 - 4.50 uIU/mL  Urinalysis, Routine w reflex microscopic     Status: Abnormal   Collection Time: 02/09/18  2:06 PM  Result Value Ref Range   Color, Urine YELLOW Yellow;Lt. Yellow   APPearance CLEAR Clear   Specific Gravity, Urine 1.020 1.000 - 1.030   pH 6.5 5.0 - 8.0   Total Protein, Urine NEGATIVE Negative   Urine Glucose NEGATIVE Negative   Ketones, ur NEGATIVE Negative   Bilirubin Urine NEGATIVE Negative   Hgb urine dipstick TRACE-INTACT (A) Negative   Urobilinogen, UA 0.2 0.0 - 1.0   Leukocytes, UA NEGATIVE Negative   Nitrite NEGATIVE Negative   WBC, UA 0-2/hpf 0-2/hpf   RBC / HPF 0-2/hpf 0-2/hpf   Squamous  Epithelial / LPF Rare(0-4/hpf) Rare(0-4/hpf)  T4, free     Status: None   Collection Time:  02/09/18  2:06 PM  Result Value Ref Range   Free T4 0.60 0.60 - 1.60 ng/dL    Comment: Specimens from patients who are undergoing biotin therapy and /or ingesting biotin supplements may contain high levels of biotin.  The higher biotin concentration in these specimens interferes with this Free T4 assay.  Specimens that contain high levels  of biotin may cause false high results for this Free T4 assay.  Please interpret results in light of the total clinical presentation of the patient.    HIV antibody (with reflex)     Status: None   Collection Time: 02/09/18  2:06 PM  Result Value Ref Range   HIV 1&2 Ab, 4th Generation NON-REACTIVE NON-REACTI    Comment: HIV-1 antigen and HIV-1/HIV-2 antibodies were not detected. There is no laboratory evidence of HIV infection. Marland Kitchen PLEASE NOTE: This information has been disclosed to you from records whose confidentiality may be protected by state law.  If your state requires such protection, then the state law prohibits you from making any further disclosure of the information without the specific written consent of the person to whom it pertains, or as otherwise permitted by law. A general authorization for the release of medical or other information is NOT sufficient for this purpose. . For additional information please refer to http://education.questdiagnostics.com/faq/FAQ106 (This link is being provided for informational/ educational purposes only.) . Marland Kitchen The performance of this assay has not been clinically validated in patients less than 11 years old. .    Objective  Body mass index is 48.29 kg/m. Wt Readings from Last 3 Encounters:  02/09/18 (!) 386 lb 6 oz (175.3 kg)  05/07/17 (!) 387 lb (175.5 kg)  12/17/16 (!) 385 lb (174.6 kg)   Temp Readings from Last 3 Encounters:  02/09/18 98.9 F (37.2 C) (Oral)  05/07/17 98.6 F (37 C) (Oral)  12/17/16 97.9 F (36.6 C) (Oral)   BP Readings from Last 3 Encounters:  02/09/18 134/78   05/07/17 128/84  12/17/16 143/95   Pulse Readings from Last 3 Encounters:  02/09/18 83  05/07/17 (!) 103  12/17/16 86   O2 sat room air 96%   Physical Exam  Constitutional: He is oriented to person, place, and time and well-developed, well-nourished, and in no distress. Vital signs are normal.  HENT:  Head: Normocephalic and atraumatic.  Mouth/Throat: Oropharynx is clear and moist and mucous membranes are normal.  Eyes: Conjunctivae are normal. Pupils are equal, round, and reactive to light.  Cardiovascular: Normal rate, regular rhythm and normal heart sounds.  NSr  Pulmonary/Chest: Effort normal and breath sounds normal.  Musculoskeletal:       Right knee: He exhibits decreased range of motion. Tenderness found. Medial joint line, lateral joint line, MCL, LCL and patellar tendon tenderness noted.       Left knee: He exhibits decreased range of motion. Tenderness found. Medial joint line, lateral joint line, MCL, LCL and patellar tendon tenderness noted.       Lumbar back: He exhibits tenderness.  B/l knee pain moderate on exam with limping gait  + str8 leg test  Neurological: He is alert and oriented to person, place, and time. Gait normal. Gait normal.  Skin: Skin is warm, dry and intact.  Psychiatric: Mood, memory, affect and judgment normal.  Nursing note and vitals reviewed.   Assessment   1. Low back pain with radiculopathy  2. B/l knee pain  3. HTN/hld  4. Obesity  5. H/o Afib  6. H/o suspected OSA  7. HM 8. DM 2 new dx a1c 6.5  Plan  1. Xray low back  Prev Charles Huang did not help Charles Huang in the past  Trial of tramadol, prednisone  Will fill out FMLA for work when get paperwork   2. See above  B/l Xrays knees today  3. Cont hyzaar 100/25 and lipitor 10 mg qhs  4. appt [facility] wt loss clinic 02/12/18  Refer to cardiology in [place] for clearance for wt loss surgery and assess for Afib and continuation of anticoagulation  5. See above  Charles Huang stopped eliquis 5 mg bid 6-7 months ago   6. Get copy of sleep study [facility]  7. Declines flu shot  Tdap per Charles Huang had 5 years ago  Consider check hep B status in future and also lipid   Lab CMET, cBC, A1C, UA, TSH, T4, HIV, declines other STD check   Former smoker   8. Will disc metformin low dose   "I spent 35-40 minutes face-to face with patient with greater than 50% of time spent counseling and/or in coordination of care with labs, referrals, imaging, review of history .     Provider: Dr. French Anaracy McLean-Scocuzza-Internal Medicine

## 2018-03-08 ENCOUNTER — Telehealth: Payer: Self-pay

## 2018-03-08 NOTE — Telephone Encounter (Signed)
I was ok we disc'ed at his last visit ok.   to drop off will take 1-2 weeks to fill out  TMS

## 2018-03-08 NOTE — Telephone Encounter (Signed)
Copied from CRM [phone]. Topic: Inquiry >> Mar 08, 2018 11:26 AM Crist InfanteHarrald, Kathy J wrote: Reason for CRM: pt states he has FMLA paperwork and wants to know if the dr needs to see him to fill out or ok for him to just drop off. Pt states the dr is aware of his knee issue

## 2018-03-08 NOTE — Telephone Encounter (Signed)
Ok to drop paperwork off?

## 2018-03-10 NOTE — Telephone Encounter (Signed)
Patient has been informed. He stated he will drop of paperwork tomorrow morning.  He verbalized understanding of the 1-2 week turn around of forms.

## 2018-03-11 ENCOUNTER — Telehealth: Payer: Self-pay | Admitting: Internal Medicine

## 2018-03-11 NOTE — Telephone Encounter (Signed)
Paperwork has been placed in provider for signature.

## 2018-03-11 NOTE — Telephone Encounter (Signed)
PT dropped off FMLA papers to be filled out. Papers are up front in Dr. Alford HighlandMcLean's color folder

## 2018-03-24 NOTE — Telephone Encounter (Signed)
Still awaiting to be done.

## 2018-03-25 ENCOUNTER — Ambulatory Visit: Payer: BLUE CROSS/BLUE SHIELD | Admitting: Cardiovascular Disease

## 2018-03-25 NOTE — Telephone Encounter (Signed)
Please advise 

## 2018-03-25 NOTE — Telephone Encounter (Signed)
Pt. Is calling to see if FMLA paperwork is completed?   Please sign and faxed back.    Please call pt. And let him know.  Monday 4/15 is the deadline, has to be in this week.

## 2018-03-26 DIAGNOSIS — Z0279 Encounter for issue of other medical certificate: Secondary | ICD-10-CM

## 2018-03-26 NOTE — Telephone Encounter (Signed)
Will have done by Monday   TMS

## 2018-03-29 ENCOUNTER — Telehealth: Payer: Self-pay

## 2018-03-29 NOTE — Telephone Encounter (Signed)
Left message for patient to return call back. He needs to be informed that FMLA paperwork has been faxed but I did notice a signature was missing from patient. I need signature from patient and to refax just incase.  I will leave Paperwork up front for patient to sign.  [facility] may notify patient.

## 2018-04-01 DIAGNOSIS — Z720 Tobacco use: Secondary | ICD-10-CM | POA: Diagnosis not present

## 2018-04-01 DIAGNOSIS — F4322 Adjustment disorder with anxiety: Secondary | ICD-10-CM | POA: Diagnosis not present

## 2018-04-04 NOTE — Progress Notes (Deleted)
Cardiology Office Note  Date:  04/04/2018   ID:  Charles CorriganCory Huang, DOB 1976/03/14, MRN [id number]  PCP:  McLean-Scocuzza, Pasty Spillersracy N, MD   No chief complaint on file.   HPI:    b/l knee pain chronic  low back pain. Morbid Obesity  Prediabetes HBA1C 6.5 htn Former smoker,  quit 05/29/16 hyperlipidemia H/o Afib 06/19/16 in the ED  stopped eliquis  H/o suspected OSA not using CPAP   Referred by Dr. French Anaracy McLean-Scocuzza for atrial fibrillation  Atrial fib 06/23/2016 EKG reviewed, rate 130 bpm seen in the ER   got up to use the bathroom when he began to feel short of breath.  onset of palpitations characterized by a racing heartbeat.   some lightheadedness.   Was given flecainide in the ER, converted to NSR     PMH:   has a past medical history of Arthritis, Atrial fibrillation (HCC), Hyperlipidemia, Hypertension, and Prediabetes.  PSH:    Past Surgical History:  Procedure Laterality Date  . NO PAST SURGERIES      Current Outpatient Medications  Medication Sig Dispense Refill  . apixaban (ELIQUIS) 5 MG TABS tablet Take 1 tablet (5 mg total) by mouth 2 (two) times daily. (Patient not taking: Reported on 02/09/2018) 180 tablet 1  . atorvastatin (LIPITOR) 10 MG tablet Take 1 tablet (10 mg total) by mouth daily. 90 tablet 3  . losartan-hydrochlorothiazide (HYZAAR) 100-25 MG tablet Take 1 tablet by mouth daily. 90 tablet 3  . predniSONE (DELTASONE) 20 MG tablet Take 2 tablets (40 mg total) by mouth daily with breakfast. 14 tablet 0  . traMADol (ULTRAM) 50 MG tablet Take 1 tablet (50 mg total) by mouth 2 (two) times daily as needed. 10 tablet 0   No current facility-administered medications for this visit.      Allergies:   Patient has no known allergies.   Social History:  The patient  reports that he quit smoking about 22 months ago. He has never used smokeless tobacco. He reports that he drinks alcohol. He reports that he does not use drugs.   Family History:   family  history includes Arthritis in his mother; Hyperlipidemia in his mother; Stroke in his mother.    Review of Systems: ROS   PHYSICAL EXAM: VS:  There were no vitals taken for this visit. , BMI There is no height or weight on file to calculate BMI. GEN: Well nourished, well developed, in no acute distress  HEENT: normal  Neck: no JVD, carotid bruits, or masses Cardiac: RRR; no murmurs, rubs, or gallops,no edema  Respiratory:  clear to auscultation bilaterally, normal work of breathing GI: soft, nontender, nondistended, + BS MS: no deformity or atrophy  Skin: warm and dry, no rash Neuro:  Strength and sensation are intact Psych: euthymic mood, full affect    Recent Labs: 02/09/2018: ALT 41; BUN 13; Creatinine, Ser 1.20; Hemoglobin 13.9; Platelets 240.0; Potassium 4.0; Sodium 141; TSH 1.03    Lipid Panel No results found for: CHOL, HDL, LDLCALC, TRIG    Wt Readings from Last 3 Encounters:  02/09/18 (!) 386 lb 6 oz (175.3 kg)  05/07/17 (!) 387 lb (175.5 kg)  12/17/16 (!) 385 lb (174.6 kg)       ASSESSMENT AND PLAN:  No diagnosis found.   Disposition:   F/U  6 months  No orders of the defined types were placed in this encounter.    Signed, Dossie Arbourim Sabre Romberger, M.D., Ph.D. 04/04/2018  [facility], [place] [phone]

## 2018-04-06 ENCOUNTER — Ambulatory Visit: Payer: BLUE CROSS/BLUE SHIELD | Admitting: Cardiovascular Disease

## 2018-04-09 ENCOUNTER — Encounter: Payer: Self-pay | Admitting: Internal Medicine

## 2018-04-09 ENCOUNTER — Ambulatory Visit (INDEPENDENT_AMBULATORY_CARE_PROVIDER_SITE_OTHER): Payer: BLUE CROSS/BLUE SHIELD | Admitting: Internal Medicine

## 2018-04-09 VITALS — BP 130/82 | HR 92 | Temp 98.4°F | Ht 75.0 in | Wt 380.0 lb

## 2018-04-09 DIAGNOSIS — E119 Type 2 diabetes mellitus without complications: Secondary | ICD-10-CM

## 2018-04-09 DIAGNOSIS — R319 Hematuria, unspecified: Secondary | ICD-10-CM

## 2018-04-09 DIAGNOSIS — Z1159 Encounter for screening for other viral diseases: Secondary | ICD-10-CM | POA: Diagnosis not present

## 2018-04-09 DIAGNOSIS — Z23 Encounter for immunization: Secondary | ICD-10-CM | POA: Diagnosis not present

## 2018-04-09 LAB — LIPID PANEL
CHOLESTEROL: 143 mg/dL (ref 0–200)
HDL: 45.7 mg/dL (ref >39.00)
LDL Cholesterol: 75 mg/dL (ref 0–99)
NonHDL: 96.99
TRIGLYCERIDES: 109 mg/dL (ref 0.0–149.0)
Total CHOL/HDL Ratio: 3
VLDL: 21.8 mg/dL (ref 0.0–40.0)

## 2018-04-09 NOTE — Progress Notes (Signed)
Pre visit review using our clinic review tool, if applicable. No additional management support is needed unless otherwise documented below in the visit note. 

## 2018-04-09 NOTE — Patient Instructions (Addendum)
Schedule A1C check in 05/2018 early  Check labs today we will let you know  Reschedule cardiology appt  Please sch eye exam and tell them you are diabetic    Exercising to Lose Weight Exercising can help you to lose weight. In order to lose weight through exercise, you need to do vigorous-intensity exercise. You can tell that you are exercising with vigorous intensity if you are breathing very hard and fast and cannot hold a conversation while exercising. Moderate-intensity exercise helps to maintain your current weight. You can tell that you are exercising at a moderate level if you have a higher heart rate and faster breathing, but you are still able to hold a conversation. How often should I exercise? Choose an activity that you enjoy and set realistic goals. Your health care provider can help you to make an activity plan that works for you. Exercise regularly as directed by your health care provider. This may include:  Doing resistance training twice each week, such as: ? Push-ups. ? Sit-ups. ? Lifting weights. ? Using resistance bands.  Doing a given intensity of exercise for a given amount of time. Choose from these options: ? 150 minutes of moderate-intensity exercise every week. ? 75 minutes of vigorous-intensity exercise every week. ? A mix of moderate-intensity and vigorous-intensity exercise every week.  Children, pregnant women, people who are out of shape, people who are overweight, and older adults may need to consult a health care provider for individual recommendations. If you have any sort of medical condition, be sure to consult your health care provider before starting a new exercise program. What are some activities that can help me to lose weight?  Walking at a rate of at least 4.5 miles an hour.  Jogging or running at a rate of 5 miles per hour.  Biking at a rate of at least 10 miles per hour.  Lap swimming.  Roller-skating or in-line skating.  Cross-country  skiing.  Vigorous competitive sports, such as football, basketball, and soccer.  Jumping rope.  Aerobic dancing. How can I be more active in my day-to-day activities?  Use the stairs instead of the elevator.  Take a walk during your lunch break.  If you drive, park your car farther away from work or school.  If you take public transportation, get off one stop early and walk the rest of the way.  Make all of your phone calls while standing up and walking around.  Get up, stretch, and walk around every 30 minutes throughout the day. What guidelines should I follow while exercising?  Do not exercise so much that you hurt yourself, feel dizzy, or get very short of breath.  Consult your health care provider prior to starting a new exercise program.  Wear comfortable clothes and shoes with good support.  Drink plenty of water while you exercise to prevent dehydration or heat stroke. Body water is lost during exercise and must be replaced.  Work out until you breathe faster and your heart beats faster. This information is not intended to replace advice given to you by your health care provider. Make sure you discuss any questions you have with your health care provider. Document Released: 01/03/2011 Document Revised: 05/08/2016 Document Reviewed: 05/04/2014 Elsevier Interactive Patient Education  2018 [employer]   Diabetes Mellitus and Nutrition When you have diabetes (diabetes mellitus), it is very important to have healthy eating habits because your blood sugar (glucose) levels are greatly affected by what you eat and drink. Eating  healthy foods in the appropriate amounts, at about the same times every day, can help you:  Control your blood glucose.  Lower your risk of heart disease.  Improve your blood pressure.  Reach or maintain a healthy weight.  Every person with diabetes is different, and each person has different needs for a meal plan. Your health care provider  may recommend that you work with a diet and nutrition specialist (dietitian) to make a meal plan that is best for you. Your meal plan may vary depending on factors such as:  The calories you need.  The medicines you take.  Your weight.  Your blood glucose, blood pressure, and cholesterol levels.  Your activity level.  Other health conditions you have, such as heart or kidney disease.  How do carbohydrates affect me? Carbohydrates affect your blood glucose level more than any other type of food. Eating carbohydrates naturally increases the amount of glucose in your blood. Carbohydrate counting is a method for keeping track of how many carbohydrates you eat. Counting carbohydrates is important to keep your blood glucose at a healthy level, especially if you use insulin or take certain oral diabetes medicines. It is important to know how many carbohydrates you can safely have in each meal. This is different for every person. Your dietitian can help you calculate how many carbohydrates you should have at each meal and for snack. Foods that contain carbohydrates include:  Bread, cereal, rice, pasta, and crackers.  Potatoes and corn.  Peas, beans, and lentils.  Milk and yogurt.  Fruit and juice.  Desserts, such as cakes, cookies, ice cream, and candy.  How does alcohol affect me? Alcohol can cause a sudden decrease in blood glucose (hypoglycemia), especially if you use insulin or take certain oral diabetes medicines. Hypoglycemia can be a life-threatening condition. Symptoms of hypoglycemia (sleepiness, dizziness, and confusion) are similar to symptoms of having too much alcohol. If your health care provider says that alcohol is safe for you, follow these guidelines:  Limit alcohol intake to no more than 1 drink per day for nonpregnant women and 2 drinks per day for men. One drink equals 12 oz of beer, 5 oz of wine, or 1 oz of hard liquor.  Do not drink on an empty stomach.  Keep  yourself hydrated with water, diet soda, or unsweetened iced tea.  Keep in mind that regular soda, juice, and other mixers may contain a lot of sugar and must be counted as carbohydrates.  What are tips for following this plan? Reading food labels  Start by checking the serving size on the label. The amount of calories, carbohydrates, fats, and other nutrients listed on the label are based on one serving of the food. Many foods contain more than one serving per package.  Check the total grams (g) of carbohydrates in one serving. You can calculate the number of servings of carbohydrates in one serving by dividing the total carbohydrates by 15. For example, if a food has 30 g of total carbohydrates, it would be equal to 2 servings of carbohydrates.  Check the number of grams (g) of saturated and trans fats in one serving. Choose foods that have low or no amount of these fats.  Check the number of milligrams (mg) of sodium in one serving. Most people should limit total sodium intake to less than 2,300 mg per day.  Always check the nutrition information of foods labeled as "low-fat" or "nonfat". These foods may be higher in added sugar or  refined carbohydrates and should be avoided.  Talk to your dietitian to identify your daily goals for nutrients listed on the label. Shopping  Avoid buying canned, premade, or processed foods. These foods tend to be high in fat, sodium, and added sugar.  Shop around the outside edge of the grocery store. This includes fresh fruits and vegetables, bulk grains, fresh meats, and fresh dairy. Cooking  Use low-heat cooking methods, such as baking, instead of high-heat cooking methods like deep frying.  Cook using healthy oils, such as olive, canola, or sunflower oil.  Avoid cooking with butter, cream, or high-fat meats. Meal planning  Eat meals and snacks regularly, preferably at the same times every day. Avoid going long periods of time without  eating.  Eat foods high in fiber, such as fresh fruits, vegetables, beans, and whole grains. Talk to your dietitian about how many servings of carbohydrates you can eat at each meal.  Eat 4-6 ounces of lean protein each day, such as lean meat, chicken, fish, eggs, or tofu. 1 ounce is equal to 1 ounce of meat, chicken, or fish, 1 egg, or 1/4 cup of tofu.  Eat some foods each day that contain healthy fats, such as avocado, nuts, seeds, and fish. Lifestyle   Check your blood glucose regularly.  Exercise at least 30 minutes 5 or more days each week, or as told by your health care provider.  Take medicines as told by your health care provider.  Do not use any products that contain nicotine or tobacco, such as cigarettes and e-cigarettes. If you need help quitting, ask your health care provider.  Work with a Veterinary surgeon or diabetes educator to identify strategies to manage stress and any emotional and social challenges. What are some questions to ask my health care provider?  Do I need to meet with a diabetes educator?  Do I need to meet with a dietitian?  What number can I call if I have questions?  When are the best times to check my blood glucose? Where to find more information:  American Diabetes Association: diabetes.org/food-and-fitness/food  Academy of Nutrition and Dietetics: https://www.vargas.com/  [employer] of Diabetes and Digestive and Kidney Diseases (NIH): FindJewelers.cz Summary  A healthy meal plan will help you control your blood glucose and maintain a healthy lifestyle.  Working with a diet and nutrition specialist (dietitian) can help you make a meal plan that is best for you.  Keep in mind that carbohydrates and alcohol have immediate effects on your blood glucose levels. It is important to count carbohydrates and to use alcohol carefully. This  information is not intended to replace advice given to you by your health care provider. Make sure you discuss any questions you have with your health care provider. Document Released: 08/28/2005 Document Revised: 01/05/2017 Document Reviewed: 01/05/2017 Elsevier Interactive Patient Education  [employer].

## 2018-04-09 NOTE — Progress Notes (Signed)
Chief Complaint  Patient presents with  . Follow-up   F/u  1. Obesity BMI 47.5 working out at gym with Systems analystpersonal trainer and has nutritionist and lost 6 lbs since last visit  2. DM 2 wants to hold on Metformin for now and try diet and exercise changes  3. Knees still bothering him but back pain is better  4. HTN controlled on hyzaar 100/25  5. Check labs today  6. H/o Afib not on eliquis 5 mg bid missed cardiology appt needs to resch [facility] in [place] 7. [facility]  8. He is concerned he has MS as adopted aunt had MS and he has muscle spasms, muscles locking up at times from lower legs to upper legs/thighs and bowel/bladder incontinence  -he wants to hold on neurology referral and MRI w/ and w/o contrast   Review of Systems  Constitutional: Positive for weight loss.  HENT: Negative for hearing loss.   Eyes: Negative for blurred vision.  Respiratory: Negative for shortness of breath.   Cardiovascular: Negative for chest pain.  Gastrointestinal: Negative for abdominal pain.  Genitourinary:       Bowel and bladder incontinence   Musculoskeletal: Positive for joint pain.       +muscle spasms   Skin: Negative for rash.  Neurological: Negative for seizures.  Psychiatric/Behavioral: Negative for depression.   Past Medical History:  Diagnosis Date  . Arthritis   . Atrial fibrillation (HCC)   . Hyperlipidemia   . Hypertension   . Prediabetes    Past Surgical History:  Procedure Laterality Date  . NO PAST SURGERIES     Family History  Problem Relation Age of Onset  . Arthritis Mother   . Hyperlipidemia Mother   . Stroke Mother    Social History   Socioeconomic History  . Marital status: Single    Spouse name: Not on file  . Number of children: Not on file  . Years of education: Not on file  . Highest education level: Not on file  Occupational History  . Not on file  Social Needs  . Financial resource strain: Not on file  . Food insecurity:     Worry: Not on file    Inability: Not on file  . Transportation needs:    Medical: Not on file    Non-medical: Not on file  Tobacco Use  . Smoking status: Former Smoker    Last attempt to quit: 05/29/2016    Years since quitting: 1.8  . Smokeless tobacco: Never Used  Substance and Sexual Activity  . Alcohol use: Yes    Comment: socially  . Drug use: No  . Sexual activity: Yes    Birth control/protection: Condom  Lifestyle  . Physical activity:    Days per week: Not on file    Minutes per session: Not on file  . Stress: Not on file  Relationships  . Social connections:    Talks on phone: Not on file    Gets together: Not on file    Attends religious service: Not on file    Active member of club or organization: Not on file    Attends meetings of clubs or organizations: Not on file    Relationship status: Not on file  . Intimate partner violence:    Fear of current or ex partner: Not on file    Emotionally abused: Not on file    Physically abused: Not on file    Forced sexual activity:  Not on file  Other Topics Concern  . 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 apixaban (ELIQUIS) 5 MG TABS tablet Take 1 tablet (5 mg total) by mouth 2 (two) times daily.  Marland Kitchen atorvastatin (LIPITOR) 10 MG tablet Take 1 tablet (10 mg total) by mouth daily.  Marland Kitchen losartan-hydrochlorothiazide (HYZAAR) 100-25 MG tablet Take 1 tablet by mouth daily.  . traMADol (ULTRAM) 50 MG tablet Take 1 tablet (50 mg total) by mouth 2 (two) times daily as needed.   No Known Allergies Recent Results (from the past 2160 hour(s))  Comprehensive metabolic panel     Status: Abnormal   Collection Time: 02/09/18  2:06 PM  Result Value Ref Range   Sodium 141 135 - 145 mEq/L   Potassium 4.0 3.5 - 5.1 mEq/L   Chloride 103 96 - 112 mEq/L   CO2 33 (H) 19 - 32 mEq/L    Glucose, Bld 93 70 - 99 mg/dL   BUN 13 6 - 23 mg/dL   Creatinine, Ser 1.61 0.40 - 1.50 mg/dL   Total Bilirubin 0.3 0.2 - 1.2 mg/dL   Alkaline Phosphatase 50 39 - 117 U/L   AST 36 0 - 37 U/L   ALT 41 0 - 53 U/L   Total Protein 7.5 6.0 - 8.3 g/dL   Albumin 4.2 3.5 - 5.2 g/dL   Calcium 9.8 8.4 - 09.6 mg/dL   GFR 04.54 >09.81 mL/min  CBC with Differential/Platelet     Status: Abnormal   Collection Time: 02/09/18  2:06 PM  Result Value Ref Range   WBC 7.4 4.0 - 10.5 K/uL   RBC 5.10 4.22 - 5.81 Mil/uL   Hemoglobin 13.9 13.0 - 17.0 g/dL   HCT 19.1 47.8 - 29.5 %   MCV 83.2 78.0 - 100.0 fl   MCHC 32.8 30.0 - 36.0 g/dL   RDW 62.1 (H) 30.8 - 65.7 %   Platelets 240.0 150.0 - 400.0 K/uL   Neutrophils Relative % 67.6 43.0 - 77.0 %   Lymphocytes Relative 24.7 12.0 - 46.0 %   Monocytes Relative 6.1 3.0 - 12.0 %   Eosinophils Relative 1.1 0.0 - 5.0 %   Basophils Relative 0.5 0.0 - 3.0 %   Neutro Abs 5.0 1.4 - 7.7 K/uL   Lymphs Abs 1.8 0.7 - 4.0 K/uL   Monocytes Absolute 0.5 0.1 - 1.0 K/uL   Eosinophils Absolute 0.1 0.0 - 0.7 K/uL   Basophils Absolute 0.0 0.0 - 0.1 K/uL  Hemoglobin A1c     Status: None   Collection Time: 02/09/18  2:06 PM  Result Value Ref Range   Hgb A1c MFr Bld 6.5 4.6 - 6.5 %    Comment: Glycemic Control Guidelines for People with Diabetes:Non Diabetic:  <6%Goal of Therapy: <7%Additional Action Suggested:  >8%   TSH     Status: None   Collection Time: 02/09/18  2:06 PM  Result Value Ref Range   TSH 1.03 0.35 - 4.50 uIU/mL  Urinalysis, Routine w reflex microscopic     Status: Abnormal   Collection Time: 02/09/18  2:06 PM  Result Value Ref Range   Color, Urine YELLOW Yellow;Lt. Yellow   APPearance CLEAR Clear   Specific Gravity, Urine 1.020 1.000 - 1.030   pH 6.5 5.0 -  8.0   Total Protein, Urine NEGATIVE Negative   Urine Glucose NEGATIVE Negative   Ketones, ur NEGATIVE Negative   Bilirubin Urine NEGATIVE Negative   Hgb urine dipstick TRACE-INTACT (A) Negative    Urobilinogen, UA 0.2 0.0 - 1.0   Leukocytes, UA NEGATIVE Negative   Nitrite NEGATIVE Negative   WBC, UA 0-2/hpf 0-2/hpf   RBC / HPF 0-2/hpf 0-2/hpf   Squamous Epithelial / LPF Rare(0-4/hpf) Rare(0-4/hpf)  T4, free     Status: None   Collection Time: 02/09/18  2:06 PM  Result Value Ref Range   Free T4 0.60 0.60 - 1.60 ng/dL    Comment: Specimens from patients who are undergoing biotin therapy and /or ingesting biotin supplements may contain high levels of biotin.  The higher biotin concentration in these specimens interferes with this Free T4 assay.  Specimens that contain high levels  of biotin may cause false high results for this Free T4 assay.  Please interpret results in light of the total clinical presentation of the patient.    HIV antibody (with reflex)     Status: None   Collection Time: 02/09/18  2:06 PM  Result Value Ref Range   HIV 1&2 Ab, 4th Generation NON-REACTIVE NON-REACTI    Comment: HIV-1 antigen and HIV-1/HIV-2 antibodies were not detected. There is no laboratory evidence of HIV infection. Marland Kitchen PLEASE NOTE: This information has been disclosed to you from records whose confidentiality may be protected by state law.  If your state requires such protection, then the state law prohibits you from making any further disclosure of the information without the specific written consent of the person to whom it pertains, or as otherwise permitted by law. A general authorization for the release of medical or other information is NOT sufficient for this purpose. . For additional information please refer to http://education.questdiagnostics.com/faq/FAQ106 (This link is being provided for informational/ educational purposes only.) . Marland Kitchen The performance of this assay has not been clinically validated in patients less than 59 years old. .    Objective  Body mass index is 47.5 kg/m. Wt Readings from Last 3 Encounters:  04/09/18 (!) 380 lb (172.4 kg)  02/09/18 (!) 386 lb 6 oz  (175.3 kg)  05/07/17 (!) 387 lb (175.5 kg)   Temp Readings from Last 3 Encounters:  04/09/18 98.4 F (36.9 C) (Oral)  02/09/18 98.9 F (37.2 C) (Oral)  05/07/17 98.6 F (37 C) (Oral)   BP Readings from Last 3 Encounters:  04/09/18 130/82  02/09/18 134/78  05/07/17 128/84   Pulse Readings from Last 3 Encounters:  04/09/18 92  02/09/18 83  05/07/17 (!) 103    Physical Exam  Constitutional: He is oriented to person, place, and time. Vital signs are normal. He appears well-developed and well-nourished. He is cooperative.  HENT:  Head: Normocephalic and atraumatic.  Mouth/Throat: Oropharynx is clear and moist and mucous membranes are normal.  Eyes: Pupils are equal, round, and reactive to light. Conjunctivae are normal.  Cardiovascular: Normal rate, regular rhythm and normal heart sounds.  Pulmonary/Chest: Effort normal and breath sounds normal.  Neurological: He is alert and oriented to person, place, and time. Gait normal.  Skin: Skin is warm, dry and intact.  Psychiatric: He has a normal mood and affect. His speech is normal and behavior is normal. Judgment and thought content normal. Cognition and memory are normal.  Nursing note and vitals reviewed.   Assessment   1. Obesity BMI 47.5  2. DM2 3. HTN controlled  4. Chronic  knee pain b/l, and low back pain  5. H/o Afib  6. Bowel/bladder incontinence and muscle spasms  7. HM  Plan   1. Congratulated on wt  rec exercise to lose and healthy diet choices  Has Personal trainer and nutritionist  2.  Pt declines metformin for now will try diet and exercise  Pt will sch eye exam in [place] through his job  Do foot exam in future  Lipid and urine labs today  3. Cont hyzaar 100/25 mg qd  4.  Will need ortho for knees in future  5. Needs to resch appt with cards in [place] [facility] Consider holter to see if Afib 6. Disc referral to neurology I.e [facility] pt wants to hold  Consider MRI w/ and w/o contrast to w/u pt wants to hold for now 7.   Declines flu shot  Tdap per pt had 5 years ago  pna 23 vaccine given [employer] today    Former smoker  Signed release again for sleep study [facility]  Provider: Dr. French Ana McLean-Scocuzza-Internal Medicine

## 2018-04-10 LAB — URINALYSIS, ROUTINE W REFLEX MICROSCOPIC
Bilirubin, UA: NEGATIVE
Glucose, UA: NEGATIVE
Ketones, UA: NEGATIVE
Leukocytes, UA: NEGATIVE
Nitrite, UA: NEGATIVE
Protein, UA: NEGATIVE
RBC, UA: NEGATIVE
Specific Gravity, UA: 1.022 (ref 1.005–1.030)
Urobilinogen, Ur: 0.2 mg/dL (ref 0.2–1.0)
pH, UA: 5.5 (ref 5.0–7.5)

## 2018-04-10 LAB — MICROALBUMIN / CREATININE URINE RATIO
Creatinine, Urine: 158 mg/dL
Microalb/Creat Ratio: 3.2 mg/g creat (ref 0.0–30.0)
Microalbumin, Urine: 5 ug/mL

## 2018-04-11 LAB — HEPATITIS B SURFACE ANTIGEN: HEP B S AG: NONREACTIVE

## 2018-04-11 LAB — HEPATITIS B SURFACE ANTIBODY, QUANTITATIVE: Hepatitis B-Post: <5 m[IU]/mL — ABNORMAL LOW (ref >=10)

## 2018-04-13 DIAGNOSIS — Z6841 Body Mass Index (BMI) 40.0 and over, adult: Secondary | ICD-10-CM | POA: Diagnosis not present

## 2018-04-13 DIAGNOSIS — Z713 Dietary counseling and surveillance: Secondary | ICD-10-CM | POA: Diagnosis not present

## 2018-04-27 ENCOUNTER — Encounter: Payer: Self-pay | Admitting: Internal Medicine

## 2018-04-27 DIAGNOSIS — Z87891 Personal history of nicotine dependence: Secondary | ICD-10-CM | POA: Diagnosis not present

## 2018-04-27 DIAGNOSIS — F4320 Adjustment disorder, unspecified: Secondary | ICD-10-CM | POA: Diagnosis not present

## 2018-04-28 ENCOUNTER — Encounter: Payer: Self-pay | Admitting: Family Medicine

## 2018-05-04 ENCOUNTER — Encounter: Payer: Self-pay | Admitting: Internal Medicine

## 2018-05-05 ENCOUNTER — Other Ambulatory Visit: Payer: Self-pay | Admitting: Internal Medicine

## 2018-05-05 DIAGNOSIS — M25561 Pain in right knee: Principal | ICD-10-CM

## 2018-05-05 DIAGNOSIS — M25562 Pain in left knee: Principal | ICD-10-CM

## 2018-05-05 DIAGNOSIS — M5440 Lumbago with sciatica, unspecified side: Secondary | ICD-10-CM

## 2018-05-05 DIAGNOSIS — G8929 Other chronic pain: Secondary | ICD-10-CM

## 2018-05-06 ENCOUNTER — Other Ambulatory Visit: Payer: Self-pay | Admitting: Internal Medicine

## 2018-05-06 DIAGNOSIS — M25561 Pain in right knee: Principal | ICD-10-CM

## 2018-05-06 DIAGNOSIS — M5440 Lumbago with sciatica, unspecified side: Secondary | ICD-10-CM

## 2018-05-06 DIAGNOSIS — G8929 Other chronic pain: Secondary | ICD-10-CM

## 2018-05-06 DIAGNOSIS — M25562 Pain in left knee: Principal | ICD-10-CM

## 2018-05-06 MED ORDER — TRAMADOL HCL 50 MG PO TABS: 50.0000 mg | ORAL_TABLET | Freq: Two times a day (BID) | ORAL | 0 refills | Status: DC | PRN

## 2018-05-08 ENCOUNTER — Other Ambulatory Visit: Payer: Self-pay | Admitting: Family Medicine

## 2018-07-15 DIAGNOSIS — Z6841 Body Mass Index (BMI) 40.0 and over, adult: Secondary | ICD-10-CM | POA: Diagnosis not present

## 2018-07-15 DIAGNOSIS — F4329 Adjustment disorder with other symptoms: Secondary | ICD-10-CM | POA: Diagnosis not present

## 2018-07-15 DIAGNOSIS — F54 Psychological and behavioral factors associated with disorders or diseases classified elsewhere: Secondary | ICD-10-CM | POA: Diagnosis not present

## 2018-08-12 ENCOUNTER — Ambulatory Visit (INDEPENDENT_AMBULATORY_CARE_PROVIDER_SITE_OTHER): Payer: BLUE CROSS/BLUE SHIELD | Admitting: Internal Medicine

## 2018-08-12 ENCOUNTER — Encounter: Payer: Self-pay | Admitting: Internal Medicine

## 2018-08-12 ENCOUNTER — Other Ambulatory Visit: Payer: Self-pay | Admitting: Internal Medicine

## 2018-08-12 VITALS — BP 142/80 | HR 82 | Temp 98.0°F | Resp 16 | Ht 75.0 in | Wt 364.0 lb

## 2018-08-12 DIAGNOSIS — E559 Vitamin D deficiency, unspecified: Secondary | ICD-10-CM

## 2018-08-12 DIAGNOSIS — Z1159 Encounter for screening for other viral diseases: Secondary | ICD-10-CM

## 2018-08-12 DIAGNOSIS — M25562 Pain in left knee: Secondary | ICD-10-CM

## 2018-08-12 DIAGNOSIS — I10 Essential (primary) hypertension: Secondary | ICD-10-CM

## 2018-08-12 DIAGNOSIS — G894 Chronic pain syndrome: Secondary | ICD-10-CM | POA: Insufficient documentation

## 2018-08-12 DIAGNOSIS — Z0184 Encounter for antibody response examination: Secondary | ICD-10-CM

## 2018-08-12 DIAGNOSIS — E119 Type 2 diabetes mellitus without complications: Secondary | ICD-10-CM | POA: Diagnosis not present

## 2018-08-12 DIAGNOSIS — Z23 Encounter for immunization: Secondary | ICD-10-CM | POA: Diagnosis not present

## 2018-08-12 DIAGNOSIS — M170 Bilateral primary osteoarthritis of knee: Secondary | ICD-10-CM

## 2018-08-12 DIAGNOSIS — M5440 Lumbago with sciatica, unspecified side: Secondary | ICD-10-CM

## 2018-08-12 DIAGNOSIS — M25561 Pain in right knee: Secondary | ICD-10-CM

## 2018-08-12 DIAGNOSIS — G8929 Other chronic pain: Secondary | ICD-10-CM

## 2018-08-12 HISTORY — DX: Chronic pain syndrome: G89.4

## 2018-08-12 LAB — VITAMIN D 25 HYDROXY (VIT D DEFICIENCY, FRACTURES): VITD: 10.69 ng/mL — ABNORMAL LOW (ref 30.00–100.00)

## 2018-08-12 LAB — COMPREHENSIVE METABOLIC PANEL
ALK PHOS: 44 U/L (ref 39–117)
ALT: 42 U/L (ref 0–53)
AST: 35 U/L (ref 0–37)
Albumin: 4.6 g/dL (ref 3.5–5.2)
BILIRUBIN TOTAL: 0.6 mg/dL (ref 0.2–1.2)
BUN: 18 mg/dL (ref 6–23)
CALCIUM: 9.9 mg/dL (ref 8.4–10.5)
CO2: 33 meq/L — AB (ref 19–32)
CREATININE: 1.14 mg/dL (ref 0.40–1.50)
Chloride: 100 mEq/L (ref 96–112)
GFR: 90.58 mL/min (ref >60.00)
GLUCOSE: 93 mg/dL (ref 70–99)
Potassium: 4.3 mEq/L (ref 3.5–5.1)
Sodium: 140 mEq/L (ref 135–145)
TOTAL PROTEIN: 7.8 g/dL (ref 6.0–8.3)

## 2018-08-12 LAB — POCT GLYCOSYLATED HEMOGLOBIN (HGB A1C): Hemoglobin A1C: 5.6 % (ref 4.0–5.6)

## 2018-08-12 MED ORDER — CHOLECALCIFEROL 1.25 MG (50000 UT) PO CAPS: 50000.0000 [IU] | ORAL_CAPSULE | ORAL | 1 refills | Status: DC

## 2018-08-12 MED ORDER — OXYCODONE-ACETAMINOPHEN 5-325 MG PO TABS: 1.0000 | ORAL_TABLET | Freq: Every day | ORAL | 0 refills | Status: DC | PRN

## 2018-08-12 MED ORDER — LOSARTAN POTASSIUM-HCTZ 100-25 MG PO TABS: 1.0000 | ORAL_TABLET | Freq: Every day | ORAL | 3 refills | Status: DC

## 2018-08-12 MED ORDER — TRAMADOL HCL 50 MG PO TABS: 50.0000 mg | ORAL_TABLET | Freq: Two times a day (BID) | ORAL | 0 refills | Status: DC | PRN

## 2018-08-12 NOTE — Patient Instructions (Addendum)
sch hep B vaccine in 1 month and 6 months  Hepatitis B Vaccine, Recombinant injection What is this medicine? HEPATITIS B VACCINE (hep uh TAHY tis B VAK seen) is a vaccine. It is used to prevent an infection with the hepatitis B virus. This medicine may be used for other purposes; ask your health care provider or pharmacist if you have questions. COMMON BRAND NAME(S): Engerix-B, Recombivax HB What should I tell my health care provider before I take this medicine? They need to know if you have any of these conditions: -fever, infection -heart disease -hepatitis B infection -immune system problems -kidney disease -an unusual or allergic reaction to vaccines, yeast, other medicines, foods, dyes, or preservatives -pregnant or trying to get pregnant -breast-feeding How should I use this medicine? This vaccine is for injection into a muscle. It is given by a health care professional. A copy of Vaccine Information Statements will be given before each vaccination. Read this sheet carefully each time. The sheet may change frequently. Talk to your pediatrician regarding the use of this medicine in children. While this drug may be prescribed for children as young as newborn for selected conditions, precautions do apply. Overdosage: If you think you have taken too much of this medicine contact a poison control center or emergency room at once. NOTE: This medicine is only for you. Do not share this medicine with others. What if I miss a dose? It is important not to miss your dose. Call your doctor or health care professional if you are unable to keep an appointment. What may interact with this medicine? -medicines that suppress your immune function like adalimumab, anakinra, infliximab -medicines to treat cancer -steroid medicines like prednisone or cortisone This list may not describe all possible interactions. Give your health care provider a list of all the medicines, herbs, non-prescription drugs,  or dietary supplements you use. Also tell them if you smoke, drink alcohol, or use illegal drugs. Some items may interact with your medicine. What should I watch for while using this medicine? See your health care provider for all shots of this vaccine as directed. You must have 3 shots of this vaccine for protection from hepatitis B infection. Tell your doctor right away if you have any serious or unusual side effects after getting this vaccine. What side effects may I notice from receiving this medicine? Side effects that you should report to your doctor or health care professional as soon as possible: -allergic reactions like skin rash, itching or hives, swelling of the face, lips, or tongue -breathing problems -confused, irritated -fast, irregular heartbeat -flu-like syndrome -numb, tingling pain -seizures -unusually weak or tired Side effects that usually do not require medical attention (report to your doctor or health care professional if they continue or are bothersome): -diarrhea -fever -headache -loss of appetite -muscle pain -nausea -pain, redness, swelling, or irritation at site where injected -tiredness This list may not describe all possible side effects. Call your doctor for medical advice about side effects. You may report side effects to FDA at 1-800-FDA-1088. Where should I keep my medicine? This drug is given in a hospital or clinic and will not be stored at home. NOTE: This sheet is a summary. It may not cover all possible information. If you have questions about this medicine, talk to your doctor, pharmacist, or health care provider.  2018 Elsevier/Gold Standard (2014-04-03 13:26:01)   DTaP Vaccine (Diphtheria, Tetanus, and Pertussis): What You Need to Know 1. Why get vaccinated? Diphtheria, tetanus, and pertussis  are serious diseases caused by bacteria. Diphtheria and pertussis are spread from person to person. Tetanus enters the body through cuts or  wounds. DIPHTHERIA causes a thick covering in the back of the throat.  It can lead to breathing problems, paralysis, heart failure, and even death.  TETANUS (Lockjaw) causes painful tightening of the muscles, usually all over the body.  It can lead to "locking" of the jaw so the victim cannot open his mouth or swallow. Tetanus leads to death in up to 2 out of 10 cases.  PERTUSSIS (Whooping Cough) causes coughing spells so bad that it is hard for infants to eat, drink, or breathe. These spells can last for weeks.  It can lead to pneumonia, seizures (jerking and staring spells), brain damage, and death.  Diphtheria, tetanus, and pertussis vaccine (DTaP) can help prevent these diseases. Most children who are vaccinated with DTaP will be protected throughout childhood. Many more children would get these diseases if we stopped vaccinating. DTaP is a safer version of an older vaccine called DTP. DTP is no longer used in the [place]. 2. Who should get DTaP vaccine and when? Children should get 5 doses of DTaP vaccine, one dose at each of the following ages:  2 months  4 months  6 months  15-18 months  4-6 years  DTaP may be given at the same time as other vaccines. 3. Some children should not get DTaP vaccine or should wait  Children with minor illnesses, such as a cold, may be vaccinated. But children who are moderately or severely ill should usually wait until they recover before getting DTaP vaccine.  Any child who had a life-threatening allergic reaction after a dose of DTaP should not get another dose.  Any child who suffered a brain or nervous system disease within 7 days after a dose of DTaP should not get another dose.  Talk with your doctor if your child: ? had a seizure or collapsed after a dose of DTaP, ? cried non-stop for 3 hours or more after a dose of DTaP, ? had a fever over 105F after a dose of DTaP. Ask your doctor for more information. Some of these  children should not get another dose of pertussis vaccine, but may get a vaccine without pertussis, called DT. 4. Older children and adults DTaP is not licensed for adolescents, adults, or children 4 years of age and older. But older people still need protection. A vaccine called Tdap is similar to DTaP. A single dose of Tdap is recommended for people 11 through 42 years of age. Another vaccine, called Td, protects against tetanus and diphtheria, but not pertussis. It is recommended every 10 years. There are separate Vaccine Information Statements for these vaccines. 5. What are the risks from DTaP vaccine? Getting diphtheria, tetanus, or pertussis disease is much riskier than getting DTaP vaccine. However, a vaccine, like any medicine, is capable of causing serious problems, such as severe allergic reactions. The risk of DTaP vaccine causing serious harm, or death, is extremely small. Mild problems (common)  Fever (up to about 1 child in 4)  Redness or swelling where the shot was given (up to about 1 child in 4)  Soreness or tenderness where the shot was given (up to about 1 child in 4) These problems occur more often after the 4th and 5th doses of the DTaP series than after earlier doses. Sometimes the 4th or 5th dose of DTaP vaccine is followed by swelling of the entire  arm or leg in which the shot was given, lasting 1-7 days (up to about 1 child in 30). Other mild problems include:  Fussiness (up to about 1 child in 3)  Tiredness or poor appetite (up to about 1 child in 10)  Vomiting (up to about 1 child in 50) These problems generally occur 1-3 days after the shot. Moderate problems (uncommon)  Seizure (jerking or staring) (about 1 child out of 14,000)  Non-stop crying, for 3 hours or more (up to about 1 child out of 1,000)  High fever, over 105F (about 1 child out of 16,000) Severe problems (very rare)  Serious allergic reaction (less than 1 out of a million doses)  Several  other severe problems have been reported after DTaP vaccine. These include: ? Long-term seizures, coma, or lowered consciousness ? Permanent brain damage. These are so rare it is hard to tell if they are caused by the vaccine. Controlling fever is especially important for children who have had seizures, for any reason. It is also important if another family member has had seizures. You can reduce fever and pain by giving your child an aspirin-free pain reliever when the shot is given, and for the next 24 hours, following the package instructions. 6. What if there is a serious reaction? What should I look for? Look for anything that concerns you, such as signs of a severe allergic reaction, very high fever, or behavior changes. Signs of a severe allergic reaction can include hives, swelling of the face and throat, difficulty breathing, a fast heartbeat, dizziness, and weakness. These would start a few minutes to a few hours after the vaccination. What should I do?  If you think it is a severe allergic reaction or other emergency that can't wait, call 9-1-1 or get the person to the nearest hospital. Otherwise, call your doctor.  Afterward, the reaction should be reported to the Vaccine Adverse Event Reporting System (VAERS). Your doctor might file this report, or you can do it yourself through the VAERS web site at www.vaers.LAgents.nohhs.gov, or by calling 1-508-583-2693. ? VAERS is only for reporting reactions. They do not give medical advice. 7. The National Vaccine Injury Compensation Program The [employer] Vaccine Injury Compensation Program (VICP) is a federal program that was created to compensate people who may have been injured by certain vaccines. Persons who believe they may have been injured by a vaccine can learn about the program and about filing a claim by calling 1-(305)008-8303 or visiting the VICP website at SpiritualWord.atwww.hrsa.gov/vaccinecompensation. 8. How can I learn more?  Ask your doctor.  Call  your local or state health department.  Contact the Centers for Disease Control and Prevention (CDC): ? Call (267)731-08911-(586) 556-9354 (1-800-CDC-INFO) or ? Visit CDC's website at PicCapture.uywww.cdc.gov/vaccines CDC DTaP Vaccine (Diphtheria, Tetanus, and Pertussis) VIS (04/30/06) This information is not intended to replace advice given to you by your health care provider. Make sure you discuss any questions you have with your health care provider. Document Released: 09/28/2006 Document Revised: 08/21/2016 Document Reviewed: 08/21/2016 Elsevier Interactive Patient Education  2017 [employer]

## 2018-08-12 NOTE — Progress Notes (Signed)
Chief Complaint  Patient presents with  . Follow-up    Blood work for A1c, Prediabete's    F/u 1. Chronic pain knee and back 2/2 arthritis pt is trying to lose wt but still not candidate for surgery tramadol 50 mg bid is not helping completely and wants something stronger for certain days  2. Obesity lost 16 lbs by cutting back esp meat 3. DM 2 had eye exam 07/24/18 [facility] in [place] 4 seasons  4. HTN sl elevated ran out of BP med yesterday   Review of Systems  Constitutional: Positive for weight loss.  HENT: Negative for hearing loss.   Eyes: Negative for blurred vision.  Respiratory: Negative for shortness of breath.   Cardiovascular: Negative for chest pain.  Musculoskeletal: Positive for back pain and joint pain.  Skin: Negative for rash.  Neurological: Negative for headaches.  Psychiatric/Behavioral: Negative for depression.   Past Medical History:  Diagnosis Date  . Arthritis   . Atrial fibrillation ([place])   . Hyperlipidemia   . Hypertension   . Prediabetes    Past Surgical History:  Procedure Laterality Date  . NO PAST SURGERIES     Family History  Problem Relation Age of Onset  . Arthritis Mother   . Hyperlipidemia Mother   . Stroke Mother    Social History   Socioeconomic History  . Marital status: Single    Spouse name: Not on file  . Number of children: Not on file  . Years of education: Not on file  . Highest education level: Not on file  Occupational History  . Not on file  Social Needs  . Financial resource strain: Not on file  . Food insecurity:    Worry: Not on file    Inability: Not on file  . Transportation needs:    Medical: Not on file    Non-medical: Not on file  Tobacco Use  . Smoking status: Former Smoker    Last attempt to quit: 05/29/2016    Years since quitting: 2.2  . Smokeless tobacco: Never Used  Substance and Sexual Activity  . Alcohol use: Yes    Comment: socially  . Drug use: No  . Sexual activity: Yes    Birth  control/protection: Condom  Lifestyle  . Physical activity:    Days per week: Not on file    Minutes per session: Not on file  . Stress: Not on file  Relationships  . Social connections:    Talks on phone: Not on file    Gets together: Not on file    Attends religious service: Not on file    Active member of club or organization: Not on file    Attends meetings of clubs or organizations: Not on file    Relationship status: Not on file  . Intimate partner violence:    Fear of current or ex partner: Not on file    Emotionally abused: Not on file    Physically abused: Not on file    Forced sexual activity: Not on file  Other Topics Concern  . 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 losartan-hydrochlorothiazide (HYZAAR) 100-25 MG tablet TAKE 1 TABLET BY MOUTH DAILY  . traMADol (ULTRAM) 50 MG tablet Take 1 tablet (50 mg total) by  mouth 2 (two) times daily as needed (chronic knee pain).   No Known Allergies No results found for this or any previous visit (from the past 2160 hour(s)). Objective  Body mass index is 45.5 kg/m. Wt Readings from Last 3 Encounters:  08/12/18 (!) 364 lb (165.1 kg)  04/09/18 (!) 380 lb (172.4 kg)  02/09/18 (!) 386 lb 6 oz (175.3 kg)   Temp Readings from Last 3 Encounters:  08/12/18 98 F (36.7 C) (Oral)  04/09/18 98.4 F (36.9 C) (Oral)  02/09/18 98.9 F (37.2 C) (Oral)   BP Readings from Last 3 Encounters:  08/12/18 (!) 142/80  04/09/18 130/82  02/09/18 134/78   Pulse Readings from Last 3 Encounters:  08/12/18 82  04/09/18 92  02/09/18 83    Physical Exam  Constitutional: He is oriented to person, place, and time. Vital signs are normal. He appears well-developed and well-nourished. He is cooperative.  HENT:  Head: Normocephalic and atraumatic.  Mouth/Throat: Oropharynx is clear and  moist and mucous membranes are normal.  Eyes: Pupils are equal, round, and reactive to light. Conjunctivae are normal.  Cardiovascular: Normal rate, regular rhythm and normal heart sounds.  Pulmonary/Chest: Effort normal and breath sounds normal.  Neurological: He is alert and oriented to person, place, and time. Gait normal.  Skin: Skin is warm, dry and intact.  Psychiatric: He has a normal mood and affect. His speech is normal and behavior is normal. Judgment and thought content normal. Cognition and memory are normal.  Nursing note and vitals reviewed.   Assessment   1. Chronic pain  2. DM 2  3. Obesity  4. HTN  5. HM Plan   1. Pain contract signed tramadol 50 bid prn #10 no RF and percocet 5-325 qd prn #10 no RF  Trying to lose wt for knee surgery  2. Check labs today CMET, A1C, vit D, MMR  Foot exam today nl  Get copy eye exam [facility] 4 seasons 3. Congratulated on wt loss  4. Refilled meds  Will [facility]  5.  Declines flu shot  Tdap per pt had 7 years ago unable to find will give Tdap today  Will given hep B1/3 today pna 23 vaccine utd  Labs today    Former smoker   Provider: Dr. Olivia Mackie McLean-Scocuzza-[facility]

## 2018-08-12 NOTE — Addendum Note (Signed)
Addended by: Warden FillersWRIGHT, LATOYA S on: 08/12/2018 09:16 AM   Modules accepted: Orders

## 2018-08-13 LAB — MEASLES/MUMPS/RUBELLA IMMUNITY
MUMPS IGG: 54 [AU]/ml
RUBELLA: 1.04 {index}
RUBEOLA IGG: 165 [AU]/ml

## 2018-08-18 ENCOUNTER — Encounter: Payer: Self-pay | Admitting: Internal Medicine

## 2018-09-08 ENCOUNTER — Telehealth: Payer: Self-pay | Admitting: Internal Medicine

## 2018-09-08 DIAGNOSIS — Z0279 Encounter for issue of other medical certificate: Secondary | ICD-10-CM

## 2018-09-08 NOTE — Telephone Encounter (Signed)
Paperwork given to provider for signature 

## 2018-09-08 NOTE — Telephone Encounter (Signed)
Pt dropped off FMLA papers to be complete. Papers are up front in color folder.

## 2018-09-09 NOTE — Telephone Encounter (Signed)
Paperwork has been completed and faxed.

## 2018-09-14 ENCOUNTER — Ambulatory Visit: Payer: BLUE CROSS/BLUE SHIELD

## 2018-10-11 ENCOUNTER — Other Ambulatory Visit: Payer: Self-pay | Admitting: Internal Medicine

## 2018-10-11 ENCOUNTER — Telehealth: Payer: Self-pay | Admitting: Internal Medicine

## 2018-10-11 DIAGNOSIS — I10 Essential (primary) hypertension: Secondary | ICD-10-CM

## 2018-10-11 MED ORDER — HYDROCHLOROTHIAZIDE 25 MG PO TABS: 25.0000 mg | ORAL_TABLET | Freq: Every day | ORAL | 3 refills | Status: DC

## 2018-10-11 MED ORDER — LOSARTAN POTASSIUM 100 MG PO TABS: 100.0000 mg | ORAL_TABLET | Freq: Every day | ORAL | 3 refills | Status: DC

## 2018-10-11 NOTE — Telephone Encounter (Signed)
Copied from CRM [id number]. Topic: General - Other >> Oct 11, 2018 12:11 PM Leafy Ro wrote: Reason for CRM: [facility] is calling and losartan-hctz 100-25 mg is on back order. Pharm would like to give patient 2 separate medication losartan 100 and hctz 25 mg

## 2018-10-14 NOTE — Telephone Encounter (Signed)
This was sent a separate order on 10/11/18 call pharmacy  I.e losartan100  I.le hctz 25  TMS

## 2018-10-14 NOTE — Telephone Encounter (Signed)
Charles Huang from [facility] is calling to check on this. They need to know how to help pt with medication since it is on back order. [phone]

## 2018-10-25 ENCOUNTER — Ambulatory Visit (INDEPENDENT_AMBULATORY_CARE_PROVIDER_SITE_OTHER): Payer: BLUE CROSS/BLUE SHIELD | Admitting: Family Medicine

## 2018-10-25 ENCOUNTER — Encounter: Payer: Self-pay | Admitting: Family Medicine

## 2018-10-25 VITALS — BP 144/90 | HR 80 | Temp 97.7°F | Ht 76.0 in | Wt 364.2 lb

## 2018-10-25 DIAGNOSIS — L02411 Cutaneous abscess of right axilla: Secondary | ICD-10-CM

## 2018-10-25 MED ORDER — SULFAMETHOXAZOLE-TRIMETHOPRIM 800-160 MG PO TABS: 1.0000 | ORAL_TABLET | Freq: Two times a day (BID) | ORAL | 0 refills | Status: DC

## 2018-10-25 NOTE — Progress Notes (Signed)
   Subjective:    Patient ID: Charles Huang, male    DOB: [birth year], 42 y.o.   MRN: [id number]  HPI  Presents to clinic c/o possible skin infection in right axilla.  States the area became red, raised and tender.  He stopped using deodorant and dried out skin with alcohol wipes and kept skin dry with powder.  Patient states this is never happened with him before.  He denies any fever or chills.  Denies any drainage from area.  States ever since he was able to keep the area of dried out with alcohol seems to have gone down slightly in size.  Patient Active Problem List   Diagnosis Date Noted  . Chronic pain 08/12/2018  . Vitamin D deficiency 08/12/2018  . Controlled type 2 diabetes mellitus without complication, without long-term current use of insulin (HCC) 02/09/2018  . Low back pain 02/09/2018  . Paroxysmal A-fib (HCC) 05/07/2017  . Suspected sleep apnea 04/10/2017  . Essential hypertension 04/28/2016  . Morbid obesity (HCC) 04/28/2016  . Primary osteoarthritis of both knees 04/28/2016   Social History   Tobacco Use  . Smoking status: Former Smoker    Last attempt to quit: 05/29/2016    Years since quitting: 2.4  . Smokeless tobacco: Never Used  Substance Use Topics  . Alcohol use: Yes    Comment: socially    Review of Systems  Constitutional: Negative for chills, fatigue and fever.  HENT: Negative for congestion, ear pain, sinus pain and sore throat.   Eyes: Negative.   Respiratory: Negative for cough, shortness of breath and wheezing.   Cardiovascular: Negative for chest pain, palpitations and leg swelling.  Gastrointestinal: Negative for abdominal pain, diarrhea, nausea and vomiting.  Genitourinary: Negative for dysuria, frequency and urgency.  Musculoskeletal: Negative for arthralgias and myalgias.  Skin: ?infection right axilla  Neurological: Negative for syncope, light-headedness and headaches.  Psychiatric/Behavioral: The patient is not nervous/anxious.         Objective:   Physical Exam  Constitutional: He is oriented to person, place, and time. No distress.  HENT:  Head: Normocephalic and atraumatic.  Eyes: Conjunctivae and EOM are normal. No scleral icterus.  Neck: Neck supple. No tracheal deviation present.  Cardiovascular: Normal rate, regular rhythm and normal heart sounds.  Pulmonary/Chest: Effort normal and breath sounds normal. No respiratory distress.  Musculoskeletal: He exhibits no edema.  Gait normal  Neurological: He is alert and oriented to person, place, and time. No cranial nerve deficit.  Skin: Skin is warm and dry. There is erythema.  Red, raised area in right axilla with small white head in center. Red area is about size of dime. It is not open or draining.   Psychiatric: He has a normal mood and affect. His behavior is normal.  Nursing note and vitals reviewed.      Vitals:   10/25/18 0823 10/25/18 0835  BP: (!) 150/98 (!) 144/90  Pulse: 80   Temp: 97.7 F (36.5 C)   SpO2: 97%    Assessment & Plan:    Cutaneous abscess of right axilla-patient will take Bactrim twice daily for 10 days.  Advised to try to keep skin as dry as possible in that area.  Patient advised that if abscess grows in size, becomes more red, he develops increased pain, fever, chills to call office right away.  Keep regularly scheduled follow-up with PCP as planned.  Return to clinic sooner if any issues arise.

## 2018-10-25 NOTE — Patient Instructions (Signed)
Skin Abscess A skin abscess is an infected area on or under your skin that contains pus and other material. An abscess can happen almost anywhere on your body. Some abscesses break open (rupture) on their own. Most continue to get worse unless they are treated. The infection can spread deeper into the body and into your blood, which can make you feel sick. Treatment usually involves draining the abscess. Follow these instructions at home: Abscess Care  If you have an abscess that has not drained, place a warm, clean, wet washcloth over the abscess several times a day. Do this as told by your doctor.  Follow instructions from your doctor about how to take care of your abscess. Make sure you: ? Cover the abscess with a bandage (dressing). ? Change your bandage or gauze as told by your doctor. ? Wash your hands with soap and water before you change the bandage or gauze. If you cannot use soap and water, use hand sanitizer.  Check your abscess every day for signs that the infection is getting worse. Check for: ? More redness, swelling, or pain. ? More fluid or blood. ? Warmth. ? More pus or a bad smell. Medicines   Take over-the-counter and prescription medicines only as told by your doctor.  If you were prescribed an antibiotic medicine, take it as told by your doctor. Do not stop taking the antibiotic even if you start to feel better. General instructions  To avoid spreading the infection: ? Do not share personal care items, towels, or hot tubs with others. ? Avoid making skin-to-skin contact with other people.  Keep all follow-up visits as told by your doctor. This is important. Contact a doctor if:  You have more redness, swelling, or pain around your abscess.  You have more fluid or blood coming from your abscess.  Your abscess feels warm when you touch it.  You have more pus or a bad smell coming from your abscess.  You have a fever.  Your muscles ache.  You have  chills.  You feel sick. Get help right away if:  You have very bad (severe) pain.  You see red streaks on your skin spreading away from the abscess. This information is not intended to replace advice given to you by your health care provider. Make sure you discuss any questions you have with your health care provider. Document Released: 05/19/2008 Document Revised: 07/27/2016 Document Reviewed: 10/10/2015 Elsevier Interactive Patient Education  2018 Elsevier Inc.  

## 2018-11-03 ENCOUNTER — Encounter: Payer: Self-pay | Admitting: Internal Medicine

## 2018-11-04 ENCOUNTER — Ambulatory Visit: Payer: BLUE CROSS/BLUE SHIELD | Admitting: Internal Medicine

## 2018-11-05 ENCOUNTER — Encounter: Payer: Self-pay | Admitting: Internal Medicine

## 2018-11-05 ENCOUNTER — Ambulatory Visit: Payer: BLUE CROSS/BLUE SHIELD

## 2018-11-05 ENCOUNTER — Ambulatory Visit (INDEPENDENT_AMBULATORY_CARE_PROVIDER_SITE_OTHER): Payer: BLUE CROSS/BLUE SHIELD | Admitting: Internal Medicine

## 2018-11-05 ENCOUNTER — Telehealth: Payer: Self-pay | Admitting: Internal Medicine

## 2018-11-05 VITALS — BP 138/82 | HR 108 | Temp 97.9°F | Ht 76.0 in | Wt 359.0 lb

## 2018-11-05 DIAGNOSIS — M5440 Lumbago with sciatica, unspecified side: Secondary | ICD-10-CM

## 2018-11-05 DIAGNOSIS — M5412 Radiculopathy, cervical region: Secondary | ICD-10-CM

## 2018-11-05 DIAGNOSIS — M2550 Pain in unspecified joint: Secondary | ICD-10-CM

## 2018-11-05 DIAGNOSIS — M79642 Pain in left hand: Secondary | ICD-10-CM

## 2018-11-05 DIAGNOSIS — M25562 Pain in left knee: Secondary | ICD-10-CM | POA: Diagnosis not present

## 2018-11-05 DIAGNOSIS — M5136 Other intervertebral disc degeneration, lumbar region: Secondary | ICD-10-CM | POA: Diagnosis not present

## 2018-11-05 DIAGNOSIS — G8929 Other chronic pain: Secondary | ICD-10-CM

## 2018-11-05 DIAGNOSIS — M79641 Pain in right hand: Secondary | ICD-10-CM

## 2018-11-05 DIAGNOSIS — M5416 Radiculopathy, lumbar region: Secondary | ICD-10-CM | POA: Diagnosis not present

## 2018-11-05 DIAGNOSIS — M25561 Pain in right knee: Secondary | ICD-10-CM | POA: Diagnosis not present

## 2018-11-05 LAB — SEDIMENTATION RATE: Sed Rate: 19 mm/hr — ABNORMAL HIGH (ref 0–15)

## 2018-11-05 LAB — C-REACTIVE PROTEIN: CRP: 0.2 mg/dL — AB (ref 0.5–20.0)

## 2018-11-05 MED ORDER — METHYLPREDNISOLONE ACETATE 40 MG/ML IJ SUSP
40.0000 mg | Freq: Once | INTRAMUSCULAR | Status: AC
  Administered 2018-11-05: 40 mg via INTRAMUSCULAR

## 2018-11-05 MED ORDER — OXYCODONE-ACETAMINOPHEN 5-325 MG PO TABS: 1.0000 | ORAL_TABLET | Freq: Every day | ORAL | 0 refills | Status: DC | PRN

## 2018-11-05 MED ORDER — TRAMADOL HCL 50 MG PO TABS: 50.0000 mg | ORAL_TABLET | Freq: Two times a day (BID) | ORAL | 0 refills | Status: DC | PRN

## 2018-11-05 MED ORDER — KETOROLAC TROMETHAMINE 60 MG/2ML IM SOLN
60.0000 mg | Freq: Once | INTRAMUSCULAR | Status: AC
  Administered 2018-11-05: 60 mg via INTRAMUSCULAR

## 2018-11-05 NOTE — Progress Notes (Signed)
Chief Complaint  Patient presents with  . Back Pain   Chronic pain  1. Flare today neck pain, low back pain worse 10/10 radiating down legs pain has been worse x 2 days. He c/o hand pain and reports at times numbness down arms to hands and trouble moving left hand and has to shake it to wake it up or to be able to move it. Nothing tried. GF is Conservator, museum/gallery and she is c/w all aches he has rheumatoid arthritis. Nothing tried at home   Review of Systems  Constitutional: Positive for weight loss.  Respiratory: Negative for shortness of breath.   Cardiovascular: Negative for chest pain.  Musculoskeletal: Positive for back pain, joint pain and neck pain.   Past Medical History:  Diagnosis Date  . Arthritis    hands, neck, back, knees   . Atrial fibrillation (HCC)   . Hyperlipidemia   . Hypertension   . Prediabetes    Past Surgical History:  Procedure Laterality Date  . NO PAST SURGERIES     Family History  Problem Relation Age of Onset  . Arthritis Mother   . Hyperlipidemia Mother   . Stroke Mother    Social History   Socioeconomic History  . Marital status: Single    Spouse name: Not on file  . Number of children: Not on file  . Years of education: Not on file  . Highest education level: Not on file  Occupational History  . Not on file  Social Needs  . Financial resource strain: Not on file  . Food insecurity:    Worry: Not on file    Inability: Not on file  . Transportation needs:    Medical: Not on file    Non-medical: Not on file  Tobacco Use  . Smoking status: Former Smoker    Last attempt to quit: 05/29/2016    Years since quitting: 2.4  . Smokeless tobacco: Never Used  Substance and Sexual Activity  . Alcohol use: Yes    Comment: socially  . Drug use: No  . Sexual activity: Yes    Birth control/protection: Condom  Lifestyle  . Physical activity:    Days per week: Not on file    Minutes per session: Not on file  . Stress: Not on file  Relationships  .  Social connections:    Talks on phone: Not on file    Gets together: Not on file    Attends religious service: Not on file    Active member of club or organization: Not on file    Attends meetings of clubs or organizations: Not on file    Relationship status: Not on file  . Intimate partner violence:    Fear of current or ex partner: Not on file    Emotionally abused: Not on file    Physically abused: Not on file    Forced sexual activity: Not on file  Other Topics Concern  . 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 Cholecalciferol 50000 units capsule Take 1 capsule (50,000 Units total) by mouth once a week.  . hydrochlorothiazide (HYDRODIURIL) 25 MG tablet Take 1 tablet (25 mg total) by mouth daily.  Marland Kitchen losartan (COZAAR) 100 MG tablet Take 1 tablet (100 mg total) by mouth  daily.  . sulfamethoxazole-trimethoprim (BACTRIM DS,SEPTRA DS) 800-160 MG tablet Take 1 tablet by mouth 2 (two) times daily.   No Known Allergies Recent Results (from the past 2160 hour(s))  Comprehensive metabolic panel     Status: Abnormal   Collection Time: 08/12/18  8:21 AM  Result Value Ref Range   Sodium 140 135 - 145 mEq/L   Potassium 4.3 3.5 - 5.1 mEq/L   Chloride 100 96 - 112 mEq/L   CO2 33 (H) 19 - 32 mEq/L   Glucose, Bld 93 70 - 99 mg/dL   BUN 18 6 - 23 mg/dL   Creatinine, Ser 1.611.14 0.40 - 1.50 mg/dL   Total Bilirubin 0.6 0.2 - 1.2 mg/dL   Alkaline Phosphatase 44 39 - 117 U/L   AST 35 0 - 37 U/L   ALT 42 0 - 53 U/L   Total Protein 7.8 6.0 - 8.3 g/dL   Albumin 4.6 3.5 - 5.2 g/dL   Calcium 9.9 8.4 - 09.610.5 mg/dL   GFR 04.5490.58 >09.81>60.00 mL/min  Vitamin D (25 hydroxy)     Status: Abnormal   Collection Time: 08/12/18  8:21 AM  Result Value Ref Range   VITD 10.69 (L) 30.00 - 100.00 ng/mL  Measles/Mumps/Rubella Immunity     Status: None   Collection Time:  08/12/18  8:21 AM  Result Value Ref Range   Rubeola IgG 165.00 AU/mL    Comment: AU/mL            Interpretation -----            -------------- <25.00           Negative 25.00-29.99      Equivocal >29.99           Positive . A positive result indicates that the patient has antibody to measles virus. It does not differentiate  between an active or past infection. The clinical  diagnosis must be interpreted in conjunction with  clinical signs and symptoms of the patient.    Mumps IgG 54.00 AU/mL    Comment:  AU/mL           Interpretation -------         ---------------- <9.00             Negative 9.00-10.99        Equivocal >10.99            Positive A positive result indicates that the patient has  antibody to mumps virus. It does not differentiate between an  active or past infection. The clinical diagnosis must be interpreted in conjunction with clinical signs and symptoms of the patient. .    Rubella 1.04 index    Comment:     Index            Interpretation     -----            --------------       <0.90            Not consistent with Immunity     0.90-0.99        Equivocal     > or = 1.00      Consistent with Immunity  . The presence of rubella IgG antibody suggests  immunization or past or current infection with rubella virus.   POCT HgB A1C     Status: None   Collection Time: 08/12/18  9:16 AM  Result Value Ref Range   Hemoglobin A1C 5.6 4.0 - 5.6 %  HbA1c POC (<> result, manual entry)     HbA1c, POC (prediabetic range)     HbA1c, POC (controlled diabetic range)     Objective  Body mass index is 43.7 kg/m. Wt Readings from Last 3 Encounters:  11/05/18 (!) 359 lb (162.8 kg)  10/25/18 (!) 364 lb 3.2 oz (165.2 kg)  08/12/18 (!) 364 lb (165.1 kg)   Temp Readings from Last 3 Encounters:  11/05/18 97.9 F (36.6 C) (Oral)  10/25/18 97.7 F (36.5 C) (Oral)  08/12/18 98 F (36.7 C) (Oral)   BP Readings from Last 3 Encounters:  11/05/18 138/82  10/25/18  (!) 144/90  08/12/18 (!) 142/80   Pulse Readings from Last 3 Encounters:  11/05/18 (!) 108  10/25/18 80  08/12/18 82    Physical Exam  Constitutional: He is oriented to person, place, and time. Vital signs are normal. He appears well-developed and well-nourished. He is cooperative.  HENT:  Head: Normocephalic and atraumatic.  Mouth/Throat: Oropharynx is clear and moist and mucous membranes are normal.  Eyes: Pupils are equal, round, and reactive to light. Conjunctivae are normal.  Cardiovascular: Regular rhythm and normal heart sounds. Tachycardia present.  Pulmonary/Chest: Effort normal and breath sounds normal.  Musculoskeletal:       Cervical back: He exhibits tenderness.       Lumbar back: He exhibits tenderness.  +Str8 leg test b/l   Neurological: He is alert and oriented to person, place, and time.  Abnormal gait 2/2 pain    Skin: Skin is warm, dry and intact.  Psychiatric: He has a normal mood and affect. His speech is normal and behavior is normal. Judgment and thought content normal. Cognition and memory are normal.  Nursing note and vitals reviewed.   Assessment   1. Cervicalgia ? Radiculopathy, lumbar radiculopathy, knee pain b/l +arthritis and hand pain  2. HM Plan  1. Xray neck GI in [place] and MRI low back  toradol 60 x 1 today  depomedrol 40 mg x 1  RA labs  F/u in 2-3 months  Pt on chronic pain contract for tramadol and percocet  Note for work given today  2.  Declines flu shot  Tdap put Will given hep B1/3 doses  pna 23 vaccine utd  Labs today  Former smoker  Provider: Dr. French Ana McLean-Scoc[facility]

## 2018-11-05 NOTE — Patient Instructions (Signed)
[facility] [address]  Xray neck and MRI low back  Back Pain, Adult Many adults have back pain from time to time. Common causes of back pain include:  A strained muscle or ligament.  Wear and tear (degeneration) of the spinal disks.  Arthritis.  A hit to the back.  Back pain can be short-lived (acute) or last a long time (chronic). A physical exam, lab tests, and imaging studies may be done to find the cause of your pain. Follow these instructions at home: Managing pain and stiffness  Take over-the-counter and prescription medicines only as told by your health care provider.  If directed, apply heat to the affected area as often as told by your health care provider. Use the heat source that your health care provider recommends, such as a moist heat pack or a heating pad. ? Place a towel between your skin and the heat source. ? Leave the heat on for 20-30 minutes. ? Remove the heat if your skin turns bright red. This is especially important if you are unable to feel pain, heat, or cold. You have a greater risk of getting burned.  If directed, apply ice to the injured area: ? Put ice in a plastic bag. ? Place a towel between your skin and the bag. ? Leave the ice on for 20 minutes, 2-3 times a day for the first 2-3 days. Activity  Do not stay in bed. Resting more than 1-2 days can delay your recovery.  Take short walks on even surfaces as soon as you are able. Try to increase the length of time you walk each day.  Do not sit, drive, or stand in one place for more than 30 minutes at a time. Sitting or standing for long periods of time can put stress on your back.  Use proper lifting techniques. When you bend and lift, use positions that put less stress on your back: ? Castella your knees. ? Keep the load close to your body. ? Avoid twisting.  Exercise regularly as told by your health care provider. Exercising will help your back heal faster. This also helps prevent back  injuries by keeping muscles strong and flexible.  Your health care provider may recommend that you see a physical therapist. This person can help you come up with a safe exercise program. Do any exercises as told by your physical therapist. Lifestyle  Maintain a healthy weight. Extra weight puts stress on your back and makes it difficult to have good posture.  Avoid activities or situations that make you feel anxious or stressed. Learn ways to manage anxiety and stress. One way to manage stress is through exercise. Stress and anxiety increase muscle tension and can make back pain worse. General instructions  Sleep on a firm mattress in a comfortable position. Try lying on your side with your knees slightly bent. If you lie on your back, put a pillow under your knees.  Follow your treatment plan as told by your health care provider. This may include: ? Cognitive or behavioral therapy. ? Acupuncture or massage therapy. ? Meditation or yoga. Contact a health care provider if:  You have pain that is not relieved with rest or medicine.  You have increasing pain going down into your legs or buttocks.  Your pain does not improve in 2 weeks.  You have pain at night.  You lose weight.  You have a fever or chills. Get help right away if:  You develop new bowel or  bladder control problems.  You have unusual weakness or numbness in your arms or legs.  You develop nausea or vomiting.  You develop abdominal pain.  You feel faint. Summary  Many adults have back pain from time to time. A physical exam, lab tests, and imaging studies may be done to find the cause of your pain.  Use proper lifting techniques. When you bend and lift, use positions that put less stress on your back.  Take over-the-counter and prescription medicines and apply heat or ice as directed by your health care provider. This information is not intended to replace advice given to you by your health care provider.  Make sure you discuss any questions you have with your health care provider. Document Released: 12/01/2005 Document Revised: 01/05/2017 Document Reviewed: 01/05/2017 Elsevier Interactive Patient Education  2018 [employer]  Cervical Radiculopathy Cervical radiculopathy happens when a nerve in the neck (cervical nerve) is pinched or bruised. This condition can develop because of an injury or as part of the normal aging process. Pressure on the cervical nerves can cause pain or numbness that runs from the neck all the way down into the arm and fingers. Usually, this condition gets better with rest. Treatment may be needed if the condition does not improve. What are the causes? This condition may be caused by:  Injury.  Slipped (herniated) disk.  Muscle tightness in the neck because of overuse.  Arthritis.  Breakdown or degeneration in the bones and joints of the spine (spondylosis) due to aging.  Bone spurs that may develop near the cervical nerves.  What are the signs or symptoms? Symptoms of this condition include:  Pain that runs from the neck to the arm and hand. The pain can be severe or irritating. It may be worse when the neck is moved.  Numbness or weakness in the affected arm and hand.  How is this diagnosed? This condition may be diagnosed based on symptoms, medical history, and a physical exam. You may also have tests, including:  X-rays.  CT scan.  MRI.  Electromyogram (EMG).  Nerve conduction tests.  How is this treated? In many cases, treatment is not needed for this condition. With rest, the condition usually gets better over time. If treatment is needed, options may include:  Wearing a soft neck collar for short periods of time.  Physical therapy to strengthen your neck muscles.  Medicines, such as NSAIDs, oral corticosteroids, or spinal injections.  Surgery. This may be needed if other treatments do not help. Various types of surgery may be done  depending on the cause of your problems.  Follow these instructions at home: Managing pain  Take over-the-counter and prescription medicines only as told by your health care provider.  If directed, apply ice to the affected area. ? Put ice in a plastic bag. ? Place a towel between your skin and the bag. ? Leave the ice on for 20 minutes, 2-3 times per day.  If ice does not help, you can try using heat. Take a warm shower or warm bath, or use a heat pack as told by your health care provider.  Try a gentle neck and shoulder massage to help relieve symptoms. Activity  Rest as needed. Follow instructions from your health care provider about any restrictions on activities.  Do stretching and strengthening exercises as told by your health care provider or physical therapist. General instructions  If you were given a soft collar, wear it as told by your health care  provider.  Use a flat pillow when you sleep.  Keep all follow-up visits as told by your health care provider. This is important. Contact a health care provider if:  Your condition does not improve with treatment. Get help right away if:  Your pain gets much worse and cannot be controlled with medicines.  You have weakness or numbness in your hand, arm, face, or leg.  You have a high fever.  You have a stiff, rigid neck.  You lose control of your bowels or your bladder (have incontinence).  You have trouble with walking, balance, or speaking. This information is not intended to replace advice given to you by your health care provider. Make sure you discuss any questions you have with your health care provider. Document Released: 08/26/2001 Document Revised: 05/08/2016 Document Reviewed: 01/25/2015 Elsevier Interactive Patient Education  [employer].

## 2018-11-05 NOTE — Telephone Encounter (Signed)
FYI, Pt states he will call back to schedule he needs to check his work calendar.

## 2018-11-05 NOTE — Progress Notes (Signed)
Pre visit review using our clinic review tool, if applicable. No additional management support is needed unless otherwise documented below in the visit note. 

## 2018-11-07 ENCOUNTER — Encounter: Payer: Self-pay | Admitting: Internal Medicine

## 2018-11-09 ENCOUNTER — Telehealth: Payer: Self-pay | Admitting: Internal Medicine

## 2018-11-09 LAB — CYCLIC CITRUL PEPTIDE ANTIBODY, IGG: Cyclic Citrullin Peptide Ab: <16 UNITS

## 2018-11-09 LAB — RHEUMATOID FACTOR: Rhuematoid fact SerPl-aCnc: <14 IU/mL (ref <14)

## 2018-11-09 LAB — ANA: Anti Nuclear Antibody(ANA): NEGATIVE

## 2018-11-09 NOTE — Telephone Encounter (Unsigned)
Copied from CRM [phone]. Topic: Quick Communication - Lab Results (Clinic Use ONLY) >> Nov 09, 2018  2:03 PM Davis Gourderrell, Charles Huang, CMA wrote: Called patient to inform them of 11/09/18 lab results. When patient returns call, triage nurse may disclose results.

## 2018-11-09 NOTE — Telephone Encounter (Unsigned)
Copied from CRM #[id number]. Topic: Quick Communication - Lab Results (Clinic Use ONLY) °>> Nov 09, 2018  2:03 PM Terrell, Charles Huang, CMA wrote: °Called patient to inform them of 11/09/18 lab results. When patient returns call, triage nurse may disclose results. °

## 2018-11-09 NOTE — Telephone Encounter (Signed)
Results given and documented in result note. 

## 2018-11-16 ENCOUNTER — Encounter: Payer: Self-pay | Admitting: Internal Medicine

## 2018-11-16 ENCOUNTER — Other Ambulatory Visit: Payer: Self-pay | Admitting: Internal Medicine

## 2018-11-16 DIAGNOSIS — M25561 Pain in right knee: Principal | ICD-10-CM

## 2018-11-16 DIAGNOSIS — M25562 Pain in left knee: Principal | ICD-10-CM

## 2018-11-16 DIAGNOSIS — M5412 Radiculopathy, cervical region: Secondary | ICD-10-CM

## 2018-11-16 DIAGNOSIS — M5440 Lumbago with sciatica, unspecified side: Secondary | ICD-10-CM

## 2018-11-16 DIAGNOSIS — G8929 Other chronic pain: Secondary | ICD-10-CM

## 2018-11-16 DIAGNOSIS — M5416 Radiculopathy, lumbar region: Secondary | ICD-10-CM

## 2018-11-16 MED ORDER — TRAMADOL HCL 50 MG PO TABS: 50.0000 mg | ORAL_TABLET | Freq: Two times a day (BID) | ORAL | 0 refills | Status: DC | PRN

## 2018-11-16 MED ORDER — OXYCODONE-ACETAMINOPHEN 5-325 MG PO TABS: 1.0000 | ORAL_TABLET | Freq: Every day | ORAL | 0 refills | Status: DC | PRN

## 2018-11-17 ENCOUNTER — Ambulatory Visit: Payer: BLUE CROSS/BLUE SHIELD | Admitting: Internal Medicine

## 2018-11-24 ENCOUNTER — Ambulatory Visit: Payer: BLUE CROSS/BLUE SHIELD | Admitting: Internal Medicine

## 2018-11-26 ENCOUNTER — Telehealth: Payer: Self-pay | Admitting: Internal Medicine

## 2018-11-26 ENCOUNTER — Encounter: Payer: Self-pay | Admitting: Internal Medicine

## 2018-11-26 ENCOUNTER — Other Ambulatory Visit: Payer: Self-pay | Admitting: Internal Medicine

## 2018-11-26 DIAGNOSIS — G8929 Other chronic pain: Secondary | ICD-10-CM

## 2018-11-26 DIAGNOSIS — M25561 Pain in right knee: Principal | ICD-10-CM

## 2018-11-26 DIAGNOSIS — M5416 Radiculopathy, lumbar region: Secondary | ICD-10-CM

## 2018-11-26 DIAGNOSIS — M25562 Pain in left knee: Principal | ICD-10-CM

## 2018-11-26 DIAGNOSIS — M5440 Lumbago with sciatica, unspecified side: Secondary | ICD-10-CM

## 2018-11-26 DIAGNOSIS — M5412 Radiculopathy, cervical region: Secondary | ICD-10-CM

## 2018-11-26 MED ORDER — OXYCODONE-ACETAMINOPHEN 5-325 MG PO TABS: 1.0000 | ORAL_TABLET | Freq: Every day | ORAL | 0 refills | Status: DC | PRN

## 2018-11-26 NOTE — Telephone Encounter (Signed)
Mychart sent to patient.

## 2018-11-26 NOTE — Telephone Encounter (Signed)
I just filled his pain meds on 11/16/18 this per pain contract was supposed to be for the month?  Why does he need more?

## 2018-11-29 ENCOUNTER — Encounter: Payer: Self-pay | Admitting: Internal Medicine

## 2018-12-17 ENCOUNTER — Other Ambulatory Visit: Payer: Self-pay | Admitting: Internal Medicine

## 2018-12-20 ENCOUNTER — Ambulatory Visit
Admission: RE | Admit: 2018-12-20 | Discharge: 2018-12-20 | Disposition: A | Discharge status: Home / Self Care | Payer: Self-pay | Source: Ambulatory Visit | Attending: Internal Medicine | Admitting: Internal Medicine

## 2018-12-20 ENCOUNTER — Other Ambulatory Visit: Payer: Self-pay | Admitting: Internal Medicine

## 2018-12-20 DIAGNOSIS — M5416 Radiculopathy, lumbar region: Secondary | ICD-10-CM

## 2018-12-20 DIAGNOSIS — M5136 Other intervertebral disc degeneration, lumbar region: Secondary | ICD-10-CM

## 2018-12-21 ENCOUNTER — Other Ambulatory Visit: Payer: Self-pay | Admitting: Internal Medicine

## 2018-12-21 ENCOUNTER — Encounter: Payer: Self-pay | Admitting: Internal Medicine

## 2018-12-21 DIAGNOSIS — G8929 Other chronic pain: Secondary | ICD-10-CM

## 2018-12-21 DIAGNOSIS — M5412 Radiculopathy, cervical region: Secondary | ICD-10-CM

## 2018-12-21 DIAGNOSIS — M25562 Pain in left knee: Principal | ICD-10-CM

## 2018-12-21 DIAGNOSIS — M5440 Lumbago with sciatica, unspecified side: Secondary | ICD-10-CM

## 2018-12-21 DIAGNOSIS — M5416 Radiculopathy, lumbar region: Secondary | ICD-10-CM

## 2018-12-21 DIAGNOSIS — M25561 Pain in right knee: Principal | ICD-10-CM

## 2018-12-21 MED ORDER — TRAMADOL HCL 50 MG PO TABS: 50.0000 mg | ORAL_TABLET | Freq: Two times a day (BID) | ORAL | 0 refills | Status: DC | PRN

## 2018-12-21 MED ORDER — OXYCODONE-ACETAMINOPHEN 5-325 MG PO TABS: 1.0000 | ORAL_TABLET | Freq: Every day | ORAL | 0 refills | Status: DC | PRN

## 2018-12-22 ENCOUNTER — Encounter: Payer: Self-pay | Admitting: Internal Medicine

## 2018-12-23 ENCOUNTER — Encounter: Payer: Self-pay | Admitting: *Deleted

## 2018-12-23 ENCOUNTER — Other Ambulatory Visit: Payer: Self-pay | Admitting: Internal Medicine

## 2018-12-23 DIAGNOSIS — M5416 Radiculopathy, lumbar region: Secondary | ICD-10-CM

## 2018-12-23 DIAGNOSIS — R937 Abnormal findings on diagnostic imaging of other parts of musculoskeletal system: Secondary | ICD-10-CM

## 2018-12-23 DIAGNOSIS — M25562 Pain in left knee: Secondary | ICD-10-CM

## 2018-12-23 DIAGNOSIS — M25561 Pain in right knee: Secondary | ICD-10-CM

## 2018-12-23 DIAGNOSIS — G8929 Other chronic pain: Secondary | ICD-10-CM

## 2018-12-23 DIAGNOSIS — M1990 Unspecified osteoarthritis, unspecified site: Secondary | ICD-10-CM

## 2019-01-17 ENCOUNTER — Encounter: Payer: Self-pay | Admitting: Internal Medicine

## 2019-02-15 ENCOUNTER — Ambulatory Visit: Payer: BLUE CROSS/BLUE SHIELD

## 2019-02-18 ENCOUNTER — Ambulatory Visit (INDEPENDENT_AMBULATORY_CARE_PROVIDER_SITE_OTHER): Payer: No Typology Code available for payment source

## 2019-02-18 ENCOUNTER — Encounter: Payer: Self-pay | Admitting: Internal Medicine

## 2019-02-18 ENCOUNTER — Ambulatory Visit
Admission: RE | Admit: 2019-02-18 | Discharge: 2019-02-18 | Disposition: A | Discharge status: Home / Self Care | Payer: PRIVATE HEALTH INSURANCE | Source: Ambulatory Visit | Attending: Internal Medicine | Admitting: Internal Medicine

## 2019-02-18 ENCOUNTER — Encounter: Payer: Self-pay | Admitting: *Deleted

## 2019-02-18 ENCOUNTER — Ambulatory Visit: Payer: No Typology Code available for payment source | Admitting: Internal Medicine

## 2019-02-18 VITALS — BP 156/96 | HR 87 | Temp 98.7°F | Ht 72.0 in | Wt 353.0 lb

## 2019-02-18 DIAGNOSIS — R1032 Left lower quadrant pain: Secondary | ICD-10-CM | POA: Insufficient documentation

## 2019-02-18 DIAGNOSIS — M5440 Lumbago with sciatica, unspecified side: Secondary | ICD-10-CM

## 2019-02-18 DIAGNOSIS — M25561 Pain in right knee: Secondary | ICD-10-CM

## 2019-02-18 DIAGNOSIS — M1990 Unspecified osteoarthritis, unspecified site: Secondary | ICD-10-CM

## 2019-02-18 DIAGNOSIS — M25552 Pain in left hip: Secondary | ICD-10-CM | POA: Diagnosis not present

## 2019-02-18 DIAGNOSIS — M25562 Pain in left knee: Secondary | ICD-10-CM

## 2019-02-18 DIAGNOSIS — G8929 Other chronic pain: Secondary | ICD-10-CM | POA: Insufficient documentation

## 2019-02-18 DIAGNOSIS — M5412 Radiculopathy, cervical region: Secondary | ICD-10-CM

## 2019-02-18 DIAGNOSIS — M5416 Radiculopathy, lumbar region: Secondary | ICD-10-CM | POA: Diagnosis not present

## 2019-02-18 MED ORDER — DICLOFENAC SODIUM 1 % TD GEL: 2.0000 g | Freq: Four times a day (QID) | TRANSDERMAL | 11 refills | Status: DC

## 2019-02-18 MED ORDER — OXYCODONE-ACETAMINOPHEN 5-325 MG PO TABS: 1.0000 | ORAL_TABLET | Freq: Two times a day (BID) | ORAL | 0 refills | Status: DC | PRN

## 2019-02-18 NOTE — Patient Instructions (Addendum)
1.Transient sliding-type, tiny hiatal hernia.  2.Small amount of proximal escape of contrast material in the distal esophagus without definite evidence of gastroesophageal reflux. 3.Advanced right and moderate left hip degenerative changes.  Result Narrative  DOUBLE CONTRAST UPPER GI SERIES, 05/22/2016 9:10 AM   INDICATION:R/O HIATAL [id number] Pre-op testing   COMPARISON: None.     [facility] walk in hours from 1-7 5 a week     Adductor Muscle Strain  An adductor muscle strain, also called a groin strain or pull, is an injury to the muscles or tendons on the upper, inner part of the thigh. These muscles are called the adductor muscles or groin muscles. They are responsible for moving the legs across the body or pulling the legs together. A muscle strain occurs when a muscle is overstretched and some muscle fibers are torn. An adductor muscle strain can range from mild to severe, depending on how many muscle fibers are affected and whether the muscle fibers are partially or completely torn. What are the causes? Adductor muscle strains usually occur during exercise or while participating in sports. The injury often happens when a sudden, violent force is placed on a muscle, stretching the muscle too far. A strain is more likely to happen when your muscles are not warmed up or if you are not properly conditioned. This injury may be caused by:  Stretching the adductor muscles too far or too suddenly, often during side-to-side motion with a sudden change in direction.  Putting repeated stress on the adductor muscles over a long period of time.  Performing vigorous activity without properly stretching the adductor muscles beforehand. What are the signs or symptoms? Symptoms of this condition include:  Pain and tenderness in the groin area. This begins as sharp pain and persists as a dull ache.  A popping or snapping feeling when the injury occurs (for severe  strains).  Swelling or bruising.  Muscle spasms.  Weakness in the leg.  Stiffness in the groin area with decreased ability to move the affected muscles. How is this diagnosed? This condition may be diagnosed based on:  Your symptoms and a description of how the injury occurred.  A physical exam.  Imaging tests, such as: ? X-rays. These are sometimes needed to rule out a broken bone or cartilage problems. ? An ultrasound, CT scan, or MRI. These may be done if your health care provider suspects a complete muscle tear or needs to check for other injuries. How is this treated? An adductor strain will often heal on its own. If needed, this condition may be treated with:  PRICE therapy. PRICE stands for protection of the injured area, rest, ice, pressure (compression), and elevation.  Medicines to help manage pain and swelling (anti-inflammatory medicines).  Crutches. You may be directed to use these for the first few days to minimize your pain. Depending on the severity of the muscle strain, recovery time may vary from a few weeks to several months. Severe injuries often require 4-6 weeks for recovery. In those cases, complete healing can take 4-5 months. Follow these instructions at home: PRICE Therapy   Protect the muscle from being injured again.  Rest. Do not use the strained muscle if it causes pain.  If directed, put ice on the injured area: ? Put ice in a plastic bag. ? Place a towel between your skin and the bag. ? Leave the ice on for 20 minutes, 2-3 times a day. Do this for the first 2 days after the  injury.  Apply compression by wrapping the injured area with an elastic bandage as told by your health care provider.  Raise (elevate) the injured area above the level of your heart while you are sitting or lying down. General instructions  Take over-the-counter and prescription medicines only as told by your health care provider.  Walk, stretch, and do exercises as  told by your health care provider. Only do these activities if you can do so without any pain.  Follow your treatment plan as told by your health care provider. This may include: ? Physical therapy. ? Massage. ? Local electrical stimulation (transcutaneous electrical nerve stimulation, TENS). How is this prevented?  Warm up and stretch before being active.  Cool down and stretch after being active.  Give your body time to rest between periods of activity.  Make sure to use equipment that fits you.  Be safe and responsible while being active to avoid slips and falls.  Maintain physical fitness, including: ? Proper conditioning in the adductor muscles. ? Overall strength, flexibility, and endurance. Contact a health care provider if:  You have increased pain or swelling in the affected area.  Your symptoms are not improving or they are getting worse. Summary  An adductor muscle strain, also called a groin strain or pull, is an injury to the muscles or tendons on the upper, inner part of the thigh.  A muscle strain occurs when a muscle is overstretched and some muscle fibers are torn.  Depending on the severity of the muscle strain, recovery time may vary from a few weeks to several months. This information is not intended to replace advice given to you by your health care provider. Make sure you discuss any questions you have with your health care provider. Document Released: 07/29/2004 Document Revised: 05/03/2018 Document Reviewed: 05/03/2018 Elsevier Interactive Patient Education  2019 [employer]  Hernia, Adult     A hernia is the bulging of an organ or tissue through a weak spot in the muscles of the abdomen (abdominal wall). Hernias develop most often near the belly button (navel) or the area where the leg meets the lower abdomen (groin). Common types of hernias include:  Incisional hernia. This type bulges through a scar from an abdominal surgery.  Umbilical  hernia. This type develops near the navel.  Inguinal hernia. This type develops in the groin or scrotum.  Femoral hernia. This type develops under the groin, in the upper thigh area.  Hiatal hernia. This type occurs when part of the stomach slides above the muscle that separates the abdomen from the chest (diaphragm). What are the causes? This condition may be caused by:  Heavy lifting.  Coughing over a long period of time.  Straining to have a bowel movement. Constipation can lead to straining.  An incision made during an abdominal surgery.  A physical problem that is present at birth (congenital defect).  Being overweight or obese.  Smoking.  Excess fluid in the abdomen.  Undescended testicles in males. What are the signs or symptoms? The main symptom is a skin-colored, rounded bulge in the area of the hernia. However, a bulge may not always be present. It may grow bigger or be more visible when you cough or strain (such as when lifting something heavy). A hernia that can be pushed back into the area (is reducible) rarely causes pain. A hernia that cannot be pushed back into the area (is incarcerated) may lose its blood supply (become strangulated). A hernia that is  incarcerated may cause:  Pain.  Fever.  Nausea and vomiting.  Swelling.  Constipation. How is this diagnosed? A hernia may be diagnosed based on:  Your symptoms and medical history.  A physical exam. Your health care provider may ask you to cough or move in certain ways to see if the hernia becomes visible.  Imaging tests, such as: ? X-rays. ? Ultrasound. ? CT scan. How is this treated? A hernia that is small and painless may not need to be treated. A hernia that is large or painful may be treated with surgery. Inguinal hernias may be treated with surgery to prevent incarceration or strangulation. Strangulated hernias are always treated with surgery because a lack of blood supply to the trapped organ  or tissue can cause it to die. Surgery to treat a hernia involves pushing the bulge back into place and repairing the weak area of the muscle or abdominal wall. Follow these instructions at home: Activity  Avoid straining.  Do not lift anything that is heavier than 10 lb (4.5 kg), or the limit that you are told, until your health care provider says that it is safe.  When lifting heavy objects, lift with your leg muscles, not your back muscles. Preventing constipation  Take actions to prevent constipation. Constipation leads to straining with bowel movements, which can make a hernia worse or cause a hernia repair to break down. Your health care provider may recommend that you: ? Drink enough fluid to keep your urine pale yellow. ? Eat foods that are high in fiber, such as fresh fruits and vegetables, whole grains, and beans. ? Limit foods that are high in fat and processed sugars, such as fried or sweet foods. ? Take an over-the-counter or prescription medicine for constipation. General instructions  When coughing, try to cough gently.  You may try to push the hernia back in place by very gently pressing on it while lying down. Do not try to force the bulge back in if it will not push in easily.  If you are overweight, work with your health care provider to lose weight safely.  Do not use any products that contain nicotine or tobacco, such as cigarettes and e-cigarettes. If you need help quitting, ask your health care provider.  If you are scheduled for hernia repair, watch your hernia for any changes in shape, size, or color. Tell your health care provider about any changes or new symptoms.  Take over-the-counter and prescription medicines only as told by your health care provider.  Keep all follow-up visits as told by your health care provider. This is important. Contact a health care provider if:  You develop new pain, swelling, or redness around your hernia.  You have signs of  constipation, such as: ? Fewer bowel movements in a week than normal. ? Difficulty having a bowel movement. ? Stools that are dry, hard, or larger than normal. Get help right away if:  You have a fever.  You have abdomen pain that gets worse.  You feel nauseous or you vomit.  You cannot push the hernia back in place by very gently pressing on it while lying down. Do not try to force the bulge back in if it will not push in easily.  The hernia: ? Changes in shape, size, or color. ? Feels hard or tender. These symptoms may represent a serious problem that is an emergency. Do not wait to see if the symptoms will go away. Get medical help right away. Call  your local emergency services (911 in the [place] Summary  A hernia is the bulging of an organ or tissue through a weak spot in the muscles of the abdomen (abdominal wall).  The main symptom is a skin-colored, rounded lump (bulge) in the hernia area. However, a bulge may not always be present. It may grow bigger or more visible when you cough or strain (such as when having a bowel movement).  A hernia that is small and painless may not need to be treated. A hernia that is large or painful may be treated with surgery.  Surgery to treat a hernia involves pushing the bulge back into place and repairing the weak part of the abdomen. This information is not intended to replace advice given to you by your health care provider. Make sure you discuss any questions you have with your health care provider. Document Released: 12/01/2005 Document Revised: 09/02/2017 Document Reviewed: 09/02/2017 Elsevier Interactive Patient Education  2019 [employer].

## 2019-02-18 NOTE — Progress Notes (Signed)
Chief Complaint  Patient presents with  . Groin Pain   Left groin pain since 02/16/2019  Pain is 10/10 he slipped on oil at work. Pain worse with walking and moving nothing tried. He did not fall   Chronic knee and back pain better for now   BP elevated today on hctz 25 mg qd losartan 100 mg qd    Review of Systems  Constitutional: Negative for weight loss.  HENT: Negative for hearing loss.   Eyes: Negative for blurred vision.  Respiratory: Negative for shortness of breath.   Cardiovascular: Negative for chest pain.  Gastrointestinal: Negative for abdominal pain.  Musculoskeletal: Positive for joint pain. Negative for back pain.  Skin: Negative for rash.  Neurological: Negative for headaches.  Psychiatric/Behavioral: Negative for depression.   Past Medical History:  Diagnosis Date  . Arthritis    hands, neck, back, knees   . Atrial fibrillation (HCC)   . Hyperlipidemia   . Hypertension   . Prediabetes    Past Surgical History:  Procedure Laterality Date  . NO PAST SURGERIES     Family History  Problem Relation Age of Onset  . Arthritis Mother   . Hyperlipidemia Mother   . Stroke Mother    Social History   Socioeconomic History  . Marital status: Significant Other    Spouse name: Not on file  . Number of children: Not on file  . Years of education: Not on file  . Highest education level: Not on file  Occupational History  . Not on file  Social Needs  . Financial resource strain: Not on file  . Food insecurity:    Worry: Not on file    Inability: Not on file  . Transportation needs:    Medical: Not on file    Non-medical: Not on file  Tobacco Use  . Smoking status: Former Smoker    Last attempt to quit: 05/29/2016    Years since quitting: 2.7  . Smokeless tobacco: Never Used  Substance and Sexual Activity  . Alcohol use: Yes    Comment: socially  . Drug use: No  . Sexual activity: Yes    Birth control/protection: Condom  Lifestyle  . Physical  activity:    Days per week: Not on file    Minutes per session: Not on file  . Stress: Not on file  Relationships  . Social connections:    Talks on phone: Not on file    Gets together: Not on file    Attends religious service: Not on file    Active member of club or organization: Not on file    Attends meetings of clubs or organizations: Not on file    Relationship status: Not on file  . Intimate partner violence:    Fear of current or ex partner: Not on file    Emotionally abused: Not on file    Physically abused: Not on file    Forced sexual activity: Not on file  Other Topics Concern  . 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 hydrochlorothiazide (HYDRODIURIL) 25 MG tablet Take 1 tablet (25 mg total) by mouth daily.  Marland Kitchen losartan (COZAAR) 100 MG tablet Take 1 tablet (100 mg total) by mouth daily.  Marland Kitchen oxyCODONE-acetaminophen (PERCOCET) 5-325 MG  tablet Take 1 tablet by mouth 2 (two) times daily as needed for severe pain. May take up to 2x per day if needed  . [DISCONTINUED] Cholecalciferol 50000 units capsule Take 1 capsule (50,000 Units total) by mouth once a week.  . [DISCONTINUED] oxyCODONE-acetaminophen (PERCOCET) 5-325 MG tablet Take 1 tablet by mouth daily as needed for severe pain. May take up to 2x per day if needed  . [DISCONTINUED] sulfamethoxazole-trimethoprim (BACTRIM DS,SEPTRA DS) 800-160 MG tablet Take 1 tablet by mouth 2 (two) times daily.  . [DISCONTINUED] traMADol (ULTRAM) 50 MG tablet Take 1 tablet (50 mg total) by mouth 2 (two) times daily as needed (chronic knee pain).   No Known Allergies No results found for this or any previous visit (from the past 2160 hour(s)). Objective  Body mass index is 47.88 kg/m. Wt Readings from Last 3 Encounters:  02/18/19 (!) 353 lb (160.1 kg)  11/05/18 (!) 359 lb (162.8 kg)   10/25/18 (!) 364 lb 3.2 oz (165.2 kg)   Temp Readings from Last 3 Encounters:  02/18/19 98.7 F (37.1 C) (Oral)  11/05/18 97.9 F (36.6 C) (Oral)  10/25/18 97.7 F (36.5 C) (Oral)   BP Readings from Last 3 Encounters:  02/18/19 (!) 156/96  11/05/18 138/82  10/25/18 (!) 144/90   Pulse Readings from Last 3 Encounters:  02/18/19 87  11/05/18 (!) 108  10/25/18 80    Physical Exam Vitals signs and nursing note reviewed.  Constitutional:      Appearance: Normal appearance. He is well-developed and well-groomed.  HENT:     Head: Normocephalic and atraumatic.     Nose: Nose normal.     Mouth/Throat:     Mouth: Mucous membranes are moist.     Pharynx: Oropharynx is clear.  Eyes:     Conjunctiva/sclera: Conjunctivae normal.     Pupils: Pupils are equal, round, and reactive to light.  Cardiovascular:     Rate and Rhythm: Normal rate and regular rhythm.     Heart sounds: No murmur.  Pulmonary:     Effort: Pulmonary effort is normal.     Breath sounds: Normal breath sounds.  Abdominal:     Comments: ?left inguinal hernia   Musculoskeletal:     Left hip: He exhibits bony tenderness.  Skin:    General: Skin is warm and dry.  Neurological:     General: No focal deficit present.     Mental Status: He is alert and oriented to person, place, and time. Mental status is at baseline.     Gait: Gait normal.  Psychiatric:        Attention and Perception: Attention and perception normal.        Mood and Affect: Mood and affect normal.        Speech: Speech normal.        Behavior: Behavior normal. Behavior is cooperative.        Thought Content: Thought content normal.        Cognition and Memory: Cognition and memory normal.        Judgment: Judgment normal.     Assessment   1. Left groin pain  2. Chronic knee and back pain controlled for now  3. HTN Plan   1. Xray left hip today  [place] left groin r/o hernia  Disc walk in [facility] if pain not better  Voltaren,  percocet, heat Note for work  rec stretches  2. Will need to establish with ortho in future  3.  Cont meds likely elevatd 2/2 pain   Provider: Dr. French Ana McLean-Scocuzza-[facility]

## 2019-02-18 NOTE — Progress Notes (Signed)
Pre visit review using our clinic review tool, if applicable. No additional management support is needed unless otherwise documented below in the visit note. 

## 2019-02-28 ENCOUNTER — Encounter: Payer: Self-pay | Admitting: Internal Medicine

## 2019-03-07 ENCOUNTER — Encounter: Payer: Self-pay | Admitting: Internal Medicine

## 2019-03-08 ENCOUNTER — Ambulatory Visit: Payer: No Typology Code available for payment source | Admitting: Internal Medicine

## 2019-03-08 ENCOUNTER — Other Ambulatory Visit: Payer: Self-pay

## 2019-03-08 ENCOUNTER — Emergency Department
Admission: EM | Admit: 2019-03-08 | Discharge: 2019-03-08 | Disposition: A | Discharge status: Home / Self Care | Payer: PRIVATE HEALTH INSURANCE | Attending: Emergency Medicine | Admitting: Emergency Medicine

## 2019-03-08 ENCOUNTER — Emergency Department: Payer: PRIVATE HEALTH INSURANCE

## 2019-03-08 ENCOUNTER — Encounter: Payer: Self-pay | Admitting: Emergency Medicine

## 2019-03-08 DIAGNOSIS — J069 Acute upper respiratory infection, unspecified: Secondary | ICD-10-CM

## 2019-03-08 DIAGNOSIS — Z79899 Other long term (current) drug therapy: Secondary | ICD-10-CM | POA: Insufficient documentation

## 2019-03-08 DIAGNOSIS — E785 Hyperlipidemia, unspecified: Secondary | ICD-10-CM | POA: Diagnosis not present

## 2019-03-08 DIAGNOSIS — R05 Cough: Secondary | ICD-10-CM | POA: Insufficient documentation

## 2019-03-08 DIAGNOSIS — Z87891 Personal history of nicotine dependence: Secondary | ICD-10-CM | POA: Diagnosis not present

## 2019-03-08 DIAGNOSIS — R059 Cough, unspecified: Secondary | ICD-10-CM

## 2019-03-08 DIAGNOSIS — I10 Essential (primary) hypertension: Secondary | ICD-10-CM | POA: Insufficient documentation

## 2019-03-08 DIAGNOSIS — R509 Fever, unspecified: Secondary | ICD-10-CM | POA: Diagnosis present

## 2019-03-08 NOTE — ED Triage Notes (Addendum)
Pt presents via pov from home with suspected covid 19 symptoms. Pt states that he has had fever of 99.8 today, took tylenol 2 hours ago. He reports chest pain when he tries to breathe deeply, and "coughing spells." Pt states he called his PCP at [facility] and was told to come to ED for testing. Pt states he has an evisit scheduled at 1130 but didn't want to wait.  NAD noted.

## 2019-03-08 NOTE — ED Notes (Signed)
Pt is being discharged to home. Aox4, VSS, pt does not c/o any pain at this time. AVS was given and explained to the pt and he verbalized understanding of all information.    

## 2019-03-08 NOTE — ED Triage Notes (Signed)
Says he has the symptoms of corona virus.  Says fever, cough, sob and his chest hurts.  His amulated in nad

## 2019-03-08 NOTE — ED Provider Notes (Signed)
[facility] Emergency Department Provider Note  ____________________________________________   I have reviewed the triage vital signs and the nursing notes. Where available I have reviewed prior notes and, if possible and indicated, outside hospital notes.    HISTORY  Chief Complaint Fever and Cough    HPI Charles Huang is a 43 y.o. male resents today because he is very anxious that he might have coronavirus he states.  He has had a cough for couple days, he states it hurts to cough.  He was to be evaluated at a visit but wanted to come in here to get "tested".  Unfortunately, testing does not exist at this time during this viral illness pandemic.  Patient has had cough for couple days, low-grade temps, rhinorrhea, slight sore throat.  Large community spread of the virus at this time.  No productive cough.  No exertional chest pain.  States he has done a lot to get himself in better shape and does not want to have coronavirus.  No personal family history of PE or DVT or recent travel.   Past Medical History:  Diagnosis Date  . Arthritis    hands, neck, back, knees   . Atrial fibrillation (HCC)   . Hyperlipidemia   . Hypertension   . Prediabetes     Patient Active Problem List   Diagnosis Date Noted  . Chronic pain of both knees 02/18/2019  . Chronic pain 08/12/2018  . Vitamin D deficiency 08/12/2018  . Low back pain 02/09/2018  . Paroxysmal A-fib (HCC) 05/07/2017  . Suspected sleep apnea 04/10/2017  . Essential hypertension 04/28/2016  . Morbid obesity (HCC) 04/28/2016  . Primary osteoarthritis of both knees 04/28/2016    Past Surgical History:  Procedure Laterality Date  . NO PAST SURGERIES      Prior to Admission medications   Medication Sig Start Date End Date Taking? Authorizing Provider  diclofenac sodium (VOLTAREN) 1 % GEL Apply 2-4 g topically 4 (four) times daily. 2 grams upper body and 4 grams lower body qid 02/18/19   McLean-Scocuzza,  Pasty Spillers, MD  hydrochlorothiazide (HYDRODIURIL) 25 MG tablet Take 1 tablet (25 mg total) by mouth daily. 10/11/18   McLean-Scocuzza, Pasty Spillers, MD  losartan (COZAAR) 100 MG tablet Take 1 tablet (100 mg total) by mouth daily. 10/11/18   McLean-Scocuzza, Pasty Spillers, MD  oxyCODONE-acetaminophen (PERCOCET) 5-325 MG tablet Take 1 tablet by mouth 2 (two) times daily as needed for severe pain. May take up to 2x per day if needed 02/18/19   McLean-Scocuzza, Pasty Spillers, MD    Allergies Patient has no known allergies.  Family History  Problem Relation Age of Onset  . Arthritis Mother   . Hyperlipidemia Mother   . Stroke Mother     Social History Social History   Tobacco Use  . Smoking status: Former Smoker    Last attempt to quit: 05/29/2016    Years since quitting: 2.7  . Smokeless tobacco: Never Used  Substance Use Topics  . Alcohol use: Yes    Comment: socially  . Drug use: No    Review of Systems Constitutional: Grade fevers, T-max 99.1 Eyes: No visual changes. ENT: mild sore throat. No stiff neck no neck pain Cardiovascular: Denies chest pain. Respiratory: Denies shortness of breath unless coughing Gastrointestinal:   no vomiting.  No diarrhea.  No constipation. Genitourinary: Negative for dysuria. Musculoskeletal: Negative lower extremity swelling Skin: Negative for rash. Neurological: Negative for severe headaches, focal weakness or numbness.   ____________________________________________  PHYSICAL EXAM:  VITAL SIGNS: ED Triage Vitals [03/08/19 1057]  Enc Vitals Group     BP (!) 162/140     Pulse Rate 93     Resp 18     Temp 98.2 F (36.8 C)     Temp Source Oral     SpO2 99 %     Weight (!) 339 lb (153.8 kg)     Height 6\' 4"  (1.93 m)     Head Circumference      Peak Flow      Pain Score 4     Pain Loc      Pain Edu?      Excl. in GC?     Constitutional: Alert and oriented. Well appearing and in no acute distress. Eyes: Conjunctivae are normal Head:  Atraumatic HEENT: No congestion/rhinnorhea. Mucous membranes are moist.  Oropharynx non-erythematous Neck:   Nontender with no meningismus, no masses, no stridor Cardiovascular: Normal rate, regular rhythm. Grossly normal heart sounds.  Good peripheral circulation. Respiratory: Normal respiratory effort.  No retractions. Lungs CTAB. Abdominal: Soft and nontender. No distention. No guarding no rebound Back:  There is no focal tenderness or step off.  there is no midline tenderness there are no lesions noted. there is no CVA tenderness Musculoskeletal: No lower extremity tenderness, no upper extremity tenderness. No joint effusions, no DVT signs strong distal pulses no edema Neurologic:  Normal speech and language. No gross focal neurologic deficits are appreciated.  Skin:  Skin is warm, dry and intact. No rash noted. Psychiatric: Mood and affect are normal. Speech and behavior are normal.  ____________________________________________   LABS (all labs ordered are listed, but only abnormal results are displayed)  Labs Reviewed - No data to display  Pertinent labs  results that were available during my care of the patient were reviewed by me and considered in my medical decision making (see chart for details). ____________________________________________  EKG  I personally interpreted any EKGs ordered by me or triage Sinus rhythm no acute ST elevation or depression no acute ischemic changes. ____________________________________________  RADIOLOGY  Pertinent labs & imaging results that were available during my care of the patient were reviewed by me and considered in my medical decision making (see chart for details). If possible, patient and/or family made aware of any abnormal findings.  No results found. ____________________________________________    PROCEDURES  Procedure(s) performed: None  Procedures  Critical Care performed:  None  ____________________________________________   INITIAL IMPRESSION / ASSESSMENT AND PLAN / ED COURSE  Pertinent labs & imaging results that were available during my care of the patient were reviewed by me and considered in my medical decision making (see chart for details).  Patient here out of concern for viral illness, very large community burden of this virus.  However he is well-appearing, sats 99%, we will get a chest x-ray as a baseline screening tool for him but I do not think any admission is indicated at this time .  We cannot test we cannot tell if he has coronavirus or not so we will advise quarantine pending outpatient testing availability.  Extensive return precautions and follow-up given understood does not meet any criteria for admission, chest x-ray is clear, most likely this is a viral illness, what kind of viral illnesses not something we can determine at this time.  Is a national problem.    ____________________________________________   FINAL CLINICAL IMPRESSION(S) / ED DIAGNOSES  Final diagnoses:  Cough      This  chart was dictated using voice recognition software.  Despite best efforts to proofread,  errors can occur which can change meaning.      Jeanmarie Plant, MD 03/08/19 1218

## 2019-03-08 NOTE — ED Notes (Signed)
Radiology to bedside. 

## 2019-03-08 NOTE — Discharge Instructions (Addendum)
Unfortunately, at this time, we cannot tell you whether or not you have coronavirus.  It certainly possible that you do.  For this reason we advise you to do several things.  The first thing we advise you to do is quarantining herself for the next 2 weeks or at least for 2 weeks from the start of symptoms.  Especially we advised that you stay away from anyone who could be at increased risk for this disease including especially elderly people.  If you have increased symptoms of cough or shortness of breath or you are really struggling to breathe or you have other concerns return to the emergency room.  Follow closely with your doctor.  If you have further questions please consider calling the National covid hotline.  Drink plenty of fluids, keep active to the extent that you can safely do so.

## 2019-03-16 ENCOUNTER — Encounter: Payer: Self-pay | Admitting: Internal Medicine

## 2019-03-26 ENCOUNTER — Encounter: Payer: Self-pay | Admitting: Internal Medicine

## 2019-03-28 ENCOUNTER — Encounter: Payer: Self-pay | Admitting: Internal Medicine

## 2019-03-28 ENCOUNTER — Other Ambulatory Visit: Payer: Self-pay | Admitting: Internal Medicine

## 2019-03-28 ENCOUNTER — Telehealth: Payer: Self-pay | Admitting: Internal Medicine

## 2019-03-28 DIAGNOSIS — M5440 Lumbago with sciatica, unspecified side: Secondary | ICD-10-CM

## 2019-03-28 DIAGNOSIS — M25552 Pain in left hip: Secondary | ICD-10-CM

## 2019-03-28 DIAGNOSIS — G8929 Other chronic pain: Secondary | ICD-10-CM

## 2019-03-28 DIAGNOSIS — M25562 Pain in left knee: Principal | ICD-10-CM

## 2019-03-28 DIAGNOSIS — M25561 Pain in right knee: Principal | ICD-10-CM

## 2019-03-28 DIAGNOSIS — M1990 Unspecified osteoarthritis, unspecified site: Secondary | ICD-10-CM

## 2019-03-28 DIAGNOSIS — M5416 Radiculopathy, lumbar region: Secondary | ICD-10-CM

## 2019-03-28 DIAGNOSIS — M5412 Radiculopathy, cervical region: Secondary | ICD-10-CM

## 2019-03-28 MED ORDER — CYCLOBENZAPRINE HCL 10 MG PO TABS: 10.0000 mg | ORAL_TABLET | Freq: Every evening | ORAL | 0 refills | Status: DC | PRN

## 2019-03-28 MED ORDER — OXYCODONE-ACETAMINOPHEN 5-325 MG PO TABS: 1.0000 | ORAL_TABLET | Freq: Two times a day (BID) | ORAL | 0 refills | Status: DC | PRN

## 2019-03-28 NOTE — Telephone Encounter (Signed)
Please refer pt again to  1. Dr. Yves Dill for chronic low back pain and  2. pain clinic for chronic pain in knees and back in [place] he lives here now   Thanks [employer]

## 2019-04-02 ENCOUNTER — Encounter: Payer: Self-pay | Admitting: Internal Medicine

## 2019-04-12 ENCOUNTER — Ambulatory Visit
Admission: RE | Admit: 2019-04-12 | Discharge: 2019-04-12 | Disposition: A | Discharge status: Home / Self Care | Payer: No Typology Code available for payment source | Source: Ambulatory Visit | Attending: Sports Medicine | Admitting: Sports Medicine

## 2019-04-12 ENCOUNTER — Other Ambulatory Visit: Payer: Self-pay

## 2019-04-12 ENCOUNTER — Other Ambulatory Visit: Payer: Self-pay | Admitting: Sports Medicine

## 2019-04-12 DIAGNOSIS — G8929 Other chronic pain: Secondary | ICD-10-CM

## 2019-04-12 DIAGNOSIS — M25561 Pain in right knee: Secondary | ICD-10-CM | POA: Insufficient documentation

## 2019-04-12 DIAGNOSIS — M25562 Pain in left knee: Principal | ICD-10-CM

## 2019-04-17 ENCOUNTER — Encounter: Payer: Self-pay | Admitting: Internal Medicine

## 2019-04-18 NOTE — Telephone Encounter (Signed)
Dr. Marvis Repress is aware of his appointment.

## 2019-04-18 NOTE — Progress Notes (Signed)
Patient's Name: Charles Huang  MRN: [id number]  Referring Provider: Orland Huang *  DOB: March 01, 1976  PCP: Charles Huang  DOS: 04/21/2019  Note by: Charles Santa, Huang  Service setting: Ambulatory outpatient  Specialty: Interventional Pain Management  Location: [facility] Virtual Visit  Visit type: Initial Patient Evaluation  Patient type: New Patient   Pain Management Virtual Encounter Note - Virtual Visit via [place] (real-time audio visits between healthcare provider and patient).  Patient's Phone No.:  [phone] (home); [phone] (mobile); (Preferred) [phone] Charles Huang_0 .[id number] Charles Huang, [address] [place] [address] Phone: [phone] Fax: [phone]   Pre-screening note:  Our staff contacted Charles Huang and offered him an "in person", "face-to-face" appointment versus a telephone encounter. He indicated preferring the telephone encounter, at this time.  Primary Reason(s) for Visit: Tele-Encounter for initial evaluation of one or more chronic problems (new to examiner) potentially causing chronic pain, and posing a threat to normal musculoskeletal function. (Level of risk: High) CC: No chief complaint on file.  I contacted Charles Huang on 04/21/2019 at 3:32 PM via video conference and clearly identified myself as Charles Santa, Huang. I verified that I was speaking with the correct person using two identifiers (Name and date of birth: 09/05/1976).  Advanced Informed Consent I sought verbal advanced consent from Charles Huang for virtual visit interactions. I informed Charles Huang of possible security and privacy concerns, risks, and limitations associated with providing "not-in-person" medical evaluation and management services. I also informed Charles Huang of the availability of "in-person" appointments. Finally, I informed  him that there would be a charge for the virtual visit and that he could be  personally, fully or partially, financially responsible for it. Charles Huang expressed understanding and agreed to proceed.   HPI  Charles Huang is a 43 y.o. year old, male patient, contacted today for an initial evaluation of his chronic pain. He has Essential hypertension; Morbid obesity ([place]); Primary osteoarthritis of both knees; Suspected sleep apnea; Paroxysmal A-fib ([place]); Low back pain; Chronic pain; Vitamin D deficiency; and Chronic pain of both knees on their problem list.  Pain Assessment: Location:    bilateral knees, L>R Radiating:  no Onset:  approx 6 years ago, no inciting event. Duration:  worse on the weekends when patient is working.  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  He states that he is a Furniture conservator/restorer.  Standing on his feet for 12 hours causes him severe pain. Timing:  Worse on the weekends given that is when he works.  12-hour shifts, Friday to Sunday Modifying factors:  Rest, tramadol, Percocet.  Onset and Duration: Gradual and Present longer than 3 months Cause of pain: Unknown Severity: Getting worse, NAS-11 at its worse: 10/10, NAS-11 at its best: 4/10, NAS-11 now: 6/10 and NAS-11 on the average: 6/10 Timing: Not influenced by the time of the day Aggravating Factors: Prolonged standing and Walking Alleviating Factors: Medications and Resting Associated Problems: Day-time cramps, Night-time cramps, Tingling, Pain that wakes patient up and Pain that does not allow patient to sleep Quality of Pain: Constant, Sharp, Stabbing, Throbbing and Tingling Previous Examinations or Tests: MRI scan, X-rays and Orthopedic evaluation  Previous Treatments: Narcotic medications, Physical Therapy and The patient  denies . 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  Patient states that his pain is only severe while he is on his feet and weightbearing during his work shifts Friday, Saturday, Sunday.  Patient is being referred by Charles Huang with orthopedics.  He is being considered for left knee osteotomy.    Patient states that he is interested in having his opioid medications managed by me in the perioperative and postoperative period.  I informed the patient of our clinic policy and how we perform chronic pain management.  Of note I informed the patient that it is in his best interest to refrain from any opioid analgesics prior to his surgery so that these medications can become more effective in the postoperative period.  We discussed opioid tolerance and how being on these medications can make them less effective in the postoperative period.  Patient did endorse understanding about this.  I also informed the patient that in regards to postoperative opioid prescribing, that will be done by the surgical team.  If his postoperative pain persist beyond 3 months, surgical team can refer to [place] at that time since that is the timeframe way acute postoperative pain can transition into chronic pain.  If patient is dealing with chronic persistent postoperative pain that is beyond the 3 months time.,  I will be happy to see him to evaluate him at that time.  I did discuss left knee genicular nerve block with the patient which could be helpful in managing his pain but he wanted to hold off at this time.  We had extensive discussion about non-opioid-based analgesics and I recommend patient start diclofenac 75 mg twice daily for knee pain as this has better absorption in intra-articular knee synovial fluid.  Also have him start gabapentin  as prescribed.  Patient endorsed understanding.  The patient was informed that my practice is divided into two sections: an interventional pain management section, as well as a completely separate and distinct medication management section. I explained that I have procedure days for my interventional therapies, and evaluation days for follow-ups and medication management. Because of the amount of documentation required during both, they are kept separated. This means that there is the possibility that he may be scheduled for a procedure on one day, and medication management the next. I have also informed him that because of staffing and facility limitations, I no longer take patients for medication management only. To illustrate the reasons for this, I gave the patient the example of surgeons, and how inappropriate it would be to refer a patient to his/her care, just to write for the post-surgical antibiotics on a surgery done by a different surgeon.   Because interventional pain management is my board-certified specialty, the patient was informed that joining my practice means that they are open to any and all interventional therapies. I made it clear that this does not mean that they will be forced to have any procedures done. What this means is that I believe interventional therapies to be essential part of the diagnosis and proper management of chronic pain conditions. Therefore, patients not interested in these interventional alternatives will be better served under the care of a different practitioner.  The patient was also made  aware of my Comprehensive Pain Management Safety Guidelines where by joining my practice, they limit all of their nerve blocks and joint injections to those done by our practice, for as long as we are retained to manage their care.   Historic Controlled Substance Pharmacotherapy Review   03/28/2019  1   03/28/2019  Oxycodone-Acetaminophen 5-325  10.00 5 Tr Mcl   [id number]   [place]  ([id number])   0  15.00 MME  Comm Ins   [place]    Medications: Bottles not available for inspection. Pharmacodynamics: Desired effects: Analgesia: The patient reports >50% benefit. Reported improvement in function: The patient reports medication allows him to accomplish basic ADLs. Clinically meaningful improvement in function (CMIF): Sustained CMIF goals met Perceived effectiveness: Described as relatively effective, allowing for increase in activities of daily living (ADL) Undesirable effects: Side-effects or Adverse reactions: None reported Historical Monitoring: The patient  reports no history of drug use. List of all UDS Test(s): No results found for: MDMA, COCAINSCRNUR, [place], [place], CANNABQUANT, [place], [place] List of other Serum/Urine Drug Screening Test(s):  No results found for: AMPHSCRSER, BARBSCRSER, BENZOSCRSER, COCAINSCRSER, COCAINSCRNUR, PCPSCRSER, PCPQUANT, THCSCRSER, THCU, CANNABQUANT, OPIATESCRSER, OXYSCRSER, PROPOXSCRSER, ETH Historical Background Evaluation: [place] PMP: PDMP not reviewed this encounter. Six (6) year initial data search conducted.             [place] Department of public safety, offender search: Editor, commissioning Information) Non-contributory Risk Assessment Profile: Aberrant behavior: None observed or detected today Risk factors for fatal opioid overdose: age 58-44 years old and sleep apnea Fatal overdose hazard ratio (HR): Calculation deferred Non-fatal overdose hazard ratio (HR): Calculation deferred Risk of opioid abuse or dependence: 0.7-3.0% with doses ? 36 MME/day and 6.1-26% with doses ? 120 MME/day. Substance use disorder (SUD) risk level: See below Personal History of Substance Abuse (SUD-Substance use disorder):  Alcohol: Negative  Illegal Drugs: Negative  Rx Drugs: Negative  ORT Risk Level calculation: Low Risk Opioid Risk Tool - 04/21/19 0927      Family History of Substance Abuse   Alcohol  Negative    Illegal Drugs  Negative    Rx Drugs  Negative       Personal History of Substance Abuse   Alcohol  Negative    Illegal Drugs  Negative    Rx Drugs  Negative      Age   Age between 72-45 years   Yes      History of Preadolescent Sexual Abuse   History of Preadolescent Sexual Abuse  Negative or Male      Psychological Disease   Psychological Disease  Negative    Depression  Negative      Total Score   Opioid Risk Tool Scoring  1    Opioid Risk Interpretation  Low Risk      ORT Scoring interpretation table:  Score <3 = Low Risk for SUD  Score between 4-7 = Moderate Risk for SUD  Score >8 = High Risk for Opioid Abuse   Pharmacologic Plan: Non-opioid analgesic therapy offered.            Initial impression: Pending review of available data and ordered tests.  Meds   Current Outpatient Medications:  .  Cholecalciferol (VITAMIN D-3) 125 MCG (5000 UT) TABS, Take 1 tablet by mouth daily., Disp: 90 tablet, Rfl: 3 .  cyclobenzaprine (FLEXERIL) 10 MG tablet, Take 1 tablet (10 mg total) by mouth at bedtime as needed for muscle spasms., Disp: 30 tablet, Rfl: 0 .  diclofenac sodium (VOLTAREN) 1 %  GEL, Apply 2-4 g topically 4 (four) times daily. 2 grams upper body and 4 grams lower body qid, Disp: 100 g, Rfl: 11 .  hydrochlorothiazide (HYDRODIURIL) 25 MG tablet, Take 1 tablet (25 mg total) by mouth daily., Disp: 90 tablet, Rfl: 3 .  oxyCODONE-acetaminophen (PERCOCET) 5-325 MG tablet, Take 1 tablet by mouth 2 (two) times daily as needed for severe pain. May take up to 2x per day if needed, Disp: 10 tablet, Rfl: 0 .  diclofenac (VOLTAREN) 75 MG EC tablet, Take 1 tablet (75 mg total) by mouth 2 (two) times daily after a meal., Disp: 60 tablet, Rfl: 1 .  gabapentin (NEURONTIN) 300 MG capsule, Take 1 capsule (300 mg total) by mouth at bedtime for 30 days, THEN 1 capsule (300 mg total) 2 (two) times daily for 30 days., Disp: 90 capsule, Rfl: 1 .  losartan (COZAAR) 100 MG tablet, Take 1 tablet (100 mg total) by mouth daily. (Patient not taking:  Reported on 04/21/2019), Disp: 90 tablet, Rfl: 3  ROS  Cardiovascular: High blood pressure Pulmonary or Respiratory: No reported pulmonary signs or symptoms such as wheezing and difficulty taking a deep full breath (Asthma), difficulty blowing air out (Emphysema), coughing up mucus (Bronchitis), persistent dry cough, or temporary stoppage of breathing during sleep Neurological: No reported neurological signs or symptoms such as seizures, abnormal skin sensations, urinary and/or fecal incontinence, being born with an abnormal open spine and/or a tethered spinal cord Review of Past Neurological Studies: No results found for this or any previous visit. Psychological-Psychiatric: No reported psychological or psychiatric signs or symptoms such as difficulty sleeping, anxiety, depression, delusions or hallucinations (schizophrenial), mood swings (bipolar disorders) or suicidal ideations or attempts Gastrointestinal: No reported gastrointestinal signs or symptoms such as vomiting or evacuating blood, reflux, heartburn, alternating episodes of diarrhea and constipation, inflamed or scarred liver, or pancreas or irrregular and/or infrequent bowel movements Genitourinary: No reported renal or genitourinary signs or symptoms such as difficulty voiding or producing urine, peeing blood, non-functioning kidney, kidney stones, difficulty emptying the bladder, difficulty controlling the flow of urine, or chronic kidney disease Hematological: No reported hematological signs or symptoms such as prolonged bleeding, low or poor functioning platelets, bruising or bleeding easily, hereditary bleeding problems, low energy levels due to low hemoglobin or being anemic Endocrine: No reported endocrine signs or symptoms such as high or low blood sugar, rapid heart rate due to high thyroid levels, obesity or weight gain due to slow thyroid or thyroid disease Rheumatologic: Joint aches and or swelling due to excess weight  (Osteoarthritis) Musculoskeletal: Negative for myasthenia gravis, muscular dystrophy, multiple sclerosis or malignant hyperthermia Work History: Working full time  Allergies  Charles Huang has No Known Allergies.  Laboratory Chemistry  Inflammation Markers (CRP: Acute Phase) (ESR: Chronic Phase) Lab Results  Component Value Date   CRP 0.2 (L) 11/05/2018   ESRSEDRATE 19 (H) 11/05/2018                         Rheumatology Markers Lab Results  Component Value Date   RF <14 11/05/2018   ANA NEGATIVE 11/05/2018                        Renal Function Markers Lab Results  Component Value Date   BUN 18 08/12/2018   CREATININE 1.14 08/12/2018   GFRAA >60 02/13/2017   GFRNONAA >60 02/13/2017  Hepatic Function Markers Lab Results  Component Value Date   AST 35 08/12/2018   ALT 42 08/12/2018   ALBUMIN 4.6 08/12/2018   ALKPHOS 44 08/12/2018   LIPASE 19 02/13/2017                        Electrolytes Lab Results  Component Value Date   NA 140 08/12/2018   K 4.3 08/12/2018   CL 100 08/12/2018   CALCIUM 9.9 08/12/2018                        Neuropathy Markers Lab Results  Component Value Date   HGBA1C 5.6 08/12/2018   HIV NON-REACTIVE 02/09/2018                        CNS Tests No results found for: COLORCSF, APPEARCSF, RBCCOUNTCSF, WBCCSF, POLYSCSF, LYMPHSCSF, EOSCSF, PROTEINCSF, GLUCCSF, JCVIRUS, CSFOLI, IGGCSF, LABACHR, ACETBL                      Bone Pathology Markers Lab Results  Component Value Date   VD25OH 10.69 (L) 08/12/2018                         Coagulation Parameters Lab Results  Component Value Date   PLT 240.0 02/09/2018                        Cardiovascular Markers Lab Results  Component Value Date   TROPONINI <0.03 05/30/2015   HGB 13.9 02/09/2018   HCT 42.5 02/09/2018                         ID Markers Lab Results  Component Value Date   HIV NON-REACTIVE 02/09/2018                        CA Markers No  results found for: CEA, CA125, LABCA2                      Endocrine Markers Lab Results  Component Value Date   TSH 1.03 02/09/2018   FREET4 0.60 02/09/2018                        Note: Lab results reviewed.  Imaging Review  Lumbosacral Imaging: Lumbar MR wo contrast:  Results for orders placed during the hospital encounter of 12/20/18  MR Lumbar Spine Wo Contrast   Narrative CLINICAL DATA:  Lumbar radiculopathy.  Lumbar disc degeneration  EXAM: MRI LUMBAR SPINE WITHOUT CONTRAST  TECHNIQUE: Multiplanar, multisequence MR imaging of the lumbar spine was performed. No intravenous contrast was administered.  COMPARISON:  Lumbar radiographs 02/09/2018  FINDINGS: Segmentation:  Normal  Alignment:  Slight retrolisthesis L3-4 otherwise normal alignment  Vertebrae:  Normal bone marrow.  Negative for fracture or mass.  Conus medullaris and cauda equina: Conus extends to the T12 level. Conus and cauda equina appear normal.  Paraspinal and other soft tissues: Negative for paraspinous mass or fluid collection. 3.5 cm right lower pole renal cyst.  Disc levels:  L1-2: Negative  L2-3: Mild disc degeneration and disc bulging. Small right paracentral disc protrusion. Negative for stenosis.  L3-4: Mild disc degeneration. Small central disc protrusion. Mild facet degeneration and mild spinal stenosis. Neural foramina patent bilaterally  L4-5:  Slight retrolisthesis. Mild disc and mild facet degeneration. Negative for stenosis  L5-S1: Small central disc protrusion and mild facet degeneration. Subarticular stenosis with possible impingement of the S1 nerve root bilaterally.  IMPRESSION: Small right paracentral disc protrusion L2-3  Small central disc protrusion L3-4  Small central disc protrusion and facet degeneration L5-S1. Subarticular stenosis bilaterally with possible impingement of the S1 nerve root bilaterally.   Electronically Signed   By: Franchot Gallo M.D.    On: 12/20/2018 15:00    Results for orders placed in visit on 02/09/18  DG Lumbar Spine Complete   Narrative CLINICAL DATA:  Three months of low back pain radiating into both legs. No known injury or previous back surgery.  EXAM: LUMBAR SPINE - COMPLETE 4+ VIEW  COMPARISON:  Lumbar spine series of December 10, 2014  FINDINGS: The lumbar vertebral bodies are preserved in height. The disc space heights are well maintained. There is no spondylolisthesis. There is no significant facet joint hypertrophy.  IMPRESSION: There is no acute or significant chronic bony abnormality of the lumbar spine.   Electronically Signed   By: David  Martinique M.D.   On: 02/10/2018 08:05          Hip-L DG 2-3 views:  Results for orders placed in visit on 02/18/19  DG Hip Unilat W OR W/O Pelvis 2-3 Views Left   Narrative CLINICAL DATA:  Chronic left hip pain.  EXAM: DG HIP (WITH OR WITHOUT PELVIS) 2-3V LEFT  COMPARISON:  None.  FINDINGS: Technically challenging radiograph. No evidence of fracture. Sclerotic changes of the bilateral femoral heads, with deformity of the right femoral head. Mild joint space narrowing. Sclerotic changes of the acetabular a.  IMPRESSION: Sclerotic changes of the bilateral femoral heads, with deformity of the right femoral head and mild joint space narrowing.  These findings may represent osteoarthritic changes, with possible posttraumatic changes on the right. If however the patient has risk factors for avascular necrosis of the hips, cross-sectional imaging of the pelvis may be obtained.   Electronically Signed   By: Fidela Salisbury M.D.   On: 02/18/2019 17:51    Knee-R DG 3 views:  Results for orders placed in visit on 02/09/18  DG Knee 3 Views Right   Narrative CLINICAL DATA:  Chronic bilateral knee pain for the past several years with no known injury.  EXAM: RIGHT KNEE - 3 VIEW  COMPARISON:  Right knee series of January 18, 2015.  FINDINGS: The bones are subjectively adequately mineralized. The joint spaces are reasonably well-maintained. There is beaking of the tibial spines. A spur arises from the periphery of the articular margin of the lateral femoral condyle. There is spurring of the articular margins of the patella. No acute or healing fracture is observed.  IMPRESSION: Mild osteoarthritic change of the right knee. No acute bony abnormality nor high-grade joint space loss.   Electronically Signed   By: David  Martinique M.D.   On: 02/10/2018 08:03    Knee-L DG 3 views:  Results for orders placed in visit on 02/09/18  DG Knee 3 Views Left   Narrative CLINICAL DATA:  Chronic bilateral knee pain for several years. No known injury or knee surgery.  EXAM: LEFT KNEE - 3 VIEW  COMPARISON:  Left knee series dated January 18, 2015  FINDINGS: The bones are subjectively adequately mineralized. There is beaking of the tibial spines. Spurs arise from the periphery of the medial femoral condyle and from the lateral tibial plateau. No acute fracture  is observed but the contour of the lateral tibial plateau is not well evaluated on this series. Spurs arise from the articular margins of the patella. The proximal fibula is intact.  IMPRESSION: There is mild osteoarthritic change involving all 3 joint compartments. There may be an old fracture of the lateral tibial plateau. No acute fracture or dislocation is observed.   Electronically Signed   By: David  Martinique M.D.   On: 02/10/2018 08:00     Complexity Note: Imaging results reviewed. Results shared with Charles Huang, using Layman's terms.                         PFSH  Drug: Charles Huang  reports no history of drug use. Alcohol:  reports current alcohol use. Tobacco:  reports that he quit smoking about 2 years ago. He has never used smokeless tobacco. Medical:  has a past medical history of Arthritis, Atrial fibrillation ([place]), Hyperlipidemia,  Hypertension, and Prediabetes. Family: family history includes Arthritis in his mother; Hyperlipidemia in his mother; Stroke in his mother.  Past Surgical History:  Procedure Laterality Date  . NO PAST SURGERIES     Active Ambulatory Problems    Diagnosis Date Noted  . Essential hypertension 04/28/2016  . Morbid obesity ([place]) 04/28/2016  . Primary osteoarthritis of both knees 04/28/2016  . Suspected sleep apnea 04/10/2017  . Paroxysmal A-fib ([place]) 05/07/2017  . Low back pain 02/09/2018  . Chronic pain 08/12/2018  . Vitamin D deficiency 08/12/2018  . Chronic pain of both knees 02/18/2019   Resolved Ambulatory Problems    Diagnosis Date Noted  . Mixed hyperlipidemia 04/28/2016  . Prediabetes   . Controlled type 2 diabetes mellitus without complication, without long-term current use of insulin ([place]) 02/09/2018   Past Medical History:  Diagnosis Date  . Arthritis   . Atrial fibrillation ([place])   . Hyperlipidemia   . Hypertension    Assessment  Primary Diagnosis & Pertinent Problem List: The primary encounter diagnosis was Primary osteoarthritis of left knee. Diagnoses of Chronic pain of both knees, Primary osteoarthritis of both knees, Morbid obesity ([place]), Suspected sleep apnea, and Chronic pain syndrome were also pertinent to this visit.  Visit Diagnosis (New problems to examiner): 1. Primary osteoarthritis of left knee   2. Chronic pain of both knees   3. Primary osteoarthritis of both knees   4. Morbid obesity ([place])   5. Suspected sleep apnea   6. Chronic pain syndrome    Plan of Care (Initial workup plan)  Note: Charles Huang was reminded that as per protocol, today's visit has been an evaluation only. We have not taken over the patient's controlled substance management.  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  Patient states that his pain is only  severe while he is on his feet and weightbearing during his work shifts Friday, Saturday, Sunday.  Patient is being referred by Charles Huang with orthopedics.  He is being considered for left knee osteotomy.    Patient states that he is interested in having his opioid medications managed by me in the perioperative and postoperative period.  I informed the patient of our clinic policy and how we perform chronic pain management.  Of note I informed the patient that it is in his best interest to refrain from any opioid analgesics prior to his surgery so that  these medications can become more effective in the postoperative period.  We discussed opioid tolerance and how being on these medications can make them less effective in the postoperative period.  Patient endorsed understanding.  I also informed the patient that in regards to postoperative opioid prescribing, that will be done by the surgical team.  If his postoperative pain persist beyond 3 months, surgical team can refer to [place] at that time since that is the timeframe when acute postoperative pain can transition into chronic pain.    If patient is dealing with chronic persistent postoperative pain that is beyond the 3 months time.,  I will be happy to see & evaluate him at that time.  I did discuss left knee genicular nerve block with the patient which could be helpful in managing his pain but he wanted to hold off at this time.    We had extensive discussion about non-opioid-based analgesics and I recommend patient start diclofenac 75 mg twice daily for knee pain as this has better absorption in intra-articular knee synovial fluid.  Also have him start gabapentin as prescribed below.  Patient endorsed understanding.  We also discussed how opioid medications can impact breathing and he has high risk of breathing complications in the context of his morbid obesity and obstructive sleep apnea.  We also had a discussion about weight management as the  patient's morbid obesity is likely contributing to his overall pain.   After my visit with the patient, I did contact Charles Huang and discussed treatment plan.  I informed him that I will not be managing the patient's postoperative opioid analgesics however he is happy to consult me for assistance.  If the patient's pain persists beyond 3 months from his surgery date and if he is continuing to require opioid medications at that time, then may be a good time for me to see him more on a regular basis and consider interventional therapies such as genicular nerve block and radiofrequency ablation before considering chronic opioid therapy as a part his pain management plan.  Dr. Posey Pronto was in agreement with this plan.  We also discussed techniques for perioperative pain management including multimodal analgesics on the day of surgery with gabapentin, celecoxib, acetaminophen as well as intraoperative IV ketamine infusion for opioid sparing effect and postoperative pain management.   Lab Orders     Compliance Drug Analysis, Ur Imaging Orders  No imaging studies ordered today    Referral Orders     Ambulatory referral to Psychology Procedure Orders    No procedure(s) ordered today   Pharmacotherapy (current): Medications ordered:  Meds ordered this encounter  Medications  . diclofenac (VOLTAREN) 75 MG EC tablet    Sig: Take 1 tablet (75 mg total) by mouth 2 (two) times daily after a meal.    Dispense:  60 tablet    Refill:  1  . gabapentin (NEURONTIN) 300 MG capsule    Sig: Take 1 capsule (300 mg total) by mouth at bedtime for 30 days, THEN 1 capsule (300 mg total) 2 (two) times daily for 30 days.    Dispense:  90 capsule    Refill:  1   Medications administered during this visit: Charles Huang had no medications administered during this visit.   Interventional management options: Charles Huang was informed that there is no guarantee that he would be a candidate for interventional  therapies. The decision will be based on the results of diagnostic studies, as well as Charles Huang risk profile.  Procedure(s) under consideration:  Diagnostic genicular nerve block Genicular nerve radiofrequency ablation    Future Appointments  Date Time Provider [place]  07/21/2019  2:00 PM Charles Huang [facility]    Total duration of non-face-to-face encounter: 45 minutes.  Primary Care Physician: Charles Huang Location: [facility] Note by: Charles Santa, Huang Date: 04/21/2019; Time: 3:32 PM

## 2019-04-18 NOTE — Telephone Encounter (Signed)
He has been scheduled for 5/7

## 2019-04-18 NOTE — Telephone Encounter (Signed)
Spoke to patient. Informed him that I will contact pain management to see where the hold up could be. Also noted that Dr. Landry Mellow made referral to them as well.

## 2019-04-18 NOTE — Telephone Encounter (Signed)
Pt is aware of appt on 5/7

## 2019-04-18 NOTE — Telephone Encounter (Signed)
Patient has been already made aware.

## 2019-04-19 ENCOUNTER — Other Ambulatory Visit: Payer: Self-pay | Admitting: Internal Medicine

## 2019-04-19 DIAGNOSIS — E559 Vitamin D deficiency, unspecified: Secondary | ICD-10-CM

## 2019-04-19 MED ORDER — VITAMIN D-3 125 MCG (5000 UT) PO TABS: 1.0000 | ORAL_TABLET | Freq: Every day | ORAL | 3 refills | Status: DC

## 2019-04-21 ENCOUNTER — Encounter: Payer: Self-pay | Admitting: Student in an Organized Health Care Education/Training Program

## 2019-04-21 ENCOUNTER — Ambulatory Visit
Payer: No Typology Code available for payment source | Attending: Student in an Organized Health Care Education/Training Program | Admitting: Student in an Organized Health Care Education/Training Program

## 2019-04-21 ENCOUNTER — Other Ambulatory Visit: Payer: Self-pay

## 2019-04-21 DIAGNOSIS — M25562 Pain in left knee: Secondary | ICD-10-CM

## 2019-04-21 DIAGNOSIS — M25561 Pain in right knee: Secondary | ICD-10-CM

## 2019-04-21 DIAGNOSIS — M170 Bilateral primary osteoarthritis of knee: Secondary | ICD-10-CM

## 2019-04-21 DIAGNOSIS — R29818 Other symptoms and signs involving the nervous system: Secondary | ICD-10-CM

## 2019-04-21 DIAGNOSIS — G8929 Other chronic pain: Secondary | ICD-10-CM

## 2019-04-21 DIAGNOSIS — G894 Chronic pain syndrome: Secondary | ICD-10-CM

## 2019-04-21 DIAGNOSIS — M1712 Unilateral primary osteoarthritis, left knee: Secondary | ICD-10-CM

## 2019-04-21 MED ORDER — DICLOFENAC SODIUM 75 MG PO TBEC: 75.0000 mg | DELAYED_RELEASE_TABLET | Freq: Two times a day (BID) | ORAL | 1 refills | Status: DC

## 2019-04-21 MED ORDER — GABAPENTIN 300 MG PO CAPS: ORAL_CAPSULE | ORAL | 1 refills | Status: DC

## 2019-04-25 ENCOUNTER — Other Ambulatory Visit: Payer: Self-pay | Admitting: Orthopedic Surgery

## 2019-04-25 DIAGNOSIS — G8929 Other chronic pain: Secondary | ICD-10-CM

## 2019-05-02 ENCOUNTER — Ambulatory Visit: Payer: No Typology Code available for payment source

## 2019-06-02 ENCOUNTER — Encounter: Payer: Self-pay | Admitting: Internal Medicine

## 2019-06-02 ENCOUNTER — Other Ambulatory Visit: Payer: Self-pay

## 2019-06-02 ENCOUNTER — Ambulatory Visit (INDEPENDENT_AMBULATORY_CARE_PROVIDER_SITE_OTHER): Payer: No Typology Code available for payment source | Admitting: Internal Medicine

## 2019-06-02 VITALS — Ht 76.0 in | Wt 348.0 lb

## 2019-06-02 DIAGNOSIS — G8929 Other chronic pain: Secondary | ICD-10-CM

## 2019-06-02 DIAGNOSIS — G894 Chronic pain syndrome: Secondary | ICD-10-CM

## 2019-06-02 DIAGNOSIS — I10 Essential (primary) hypertension: Secondary | ICD-10-CM | POA: Diagnosis not present

## 2019-06-02 DIAGNOSIS — M25562 Pain in left knee: Secondary | ICD-10-CM

## 2019-06-02 DIAGNOSIS — M25561 Pain in right knee: Secondary | ICD-10-CM | POA: Diagnosis not present

## 2019-06-02 DIAGNOSIS — Z1329 Encounter for screening for other suspected endocrine disorder: Secondary | ICD-10-CM

## 2019-06-02 DIAGNOSIS — R7303 Prediabetes: Secondary | ICD-10-CM

## 2019-06-02 DIAGNOSIS — Z13220 Encounter for screening for lipoid disorders: Secondary | ICD-10-CM

## 2019-06-02 DIAGNOSIS — M170 Bilateral primary osteoarthritis of knee: Secondary | ICD-10-CM

## 2019-06-02 DIAGNOSIS — Z1389 Encounter for screening for other disorder: Secondary | ICD-10-CM

## 2019-06-02 DIAGNOSIS — M5440 Lumbago with sciatica, unspecified side: Secondary | ICD-10-CM

## 2019-06-02 DIAGNOSIS — E559 Vitamin D deficiency, unspecified: Secondary | ICD-10-CM

## 2019-06-02 NOTE — Progress Notes (Signed)
Telephone Note  I connected with Charles Huang   on 06/02/19 at  9:15 AM EDT by telephoneand verified that I am speaking with the correct person using two identifiers.  Location patient: home Location provider:work or home office Persons participating in the virtual visit: patient, provider  I discussed the limitations of evaluation and management by telemedicine and the availability of in person appointments. The patient expressed understanding and agreed to proceed.   HPI: 1. Chronic pain 2/2 arthritis in back, knees he saw Dr. Patel [facility] ortho but he referred the pt to Dr. Annuziato Amendola but he missed appt 05/26/19 due to c/w COVID 19 exposure. His left knee pain is worse than his right and chronic and joints is misaligned. Diclofenac 75 mg bid is helping with pain he f/u with pain clinic and they wanted to try antiinflammatory meds 1st which are helping   2. Abnormal MRI 12/20/2018 lumbar he reports after 10 minutes on the toiliet hid left foot falls asleep and he has numbness in his feet:  FINDINGS: Segmentation:  Normal  Alignment:  Slight retrolisthesis L3-4 otherwise normal alignment  Vertebrae:  Normal bone marrow.  Negative for fracture or mass.  Conus medullaris and cauda equina: Conus extends to the T12 level. Conus and cauda equina appear normal.  Paraspinal and other soft tissues: Negative for paraspinous mass or fluid collection. 3.5 cm right lower pole renal cyst.  Disc levels:  L1-2: Negative  L2-3: Mild disc degeneration and disc bulging. Small right paracentral disc protrusion. Negative for stenosis.  L3-4: Mild disc degeneration. Small central disc protrusion. Mild facet degeneration and mild spinal stenosis. Neural foramina patent bilaterally  L4-5: Slight retrolisthesis. Mild disc and mild facet degeneration. Negative for stenosis  L5-S1: Small central disc protrusion and mild facet degeneration. Subarticular stenosis with possible impingement  of the S1 nerve root bilaterally.  IMPRESSION: Small right paracentral disc protrusion L2-3  Small central disc protrusion L3-4  Small central disc protrusion and facet degeneration L5-S1. Subarticular stenosis bilaterally with possible impingement of the S1 nerve root bilaterally.  3. He reports left hand falling asleep after lying on it at night  4. He wants labs checked with h/o prediabetes/DM2  5. Obesity weight is 348 was down to 335 lbs  6. HTN on losartan 100 hctz 25 mg qd has not been checking BP does not have BP cuff    ROS: See pertinent positives and negatives per HPI.  Past Medical History:  Diagnosis Date  . Arthritis    hands, neck, back, knees   . Atrial fibrillation (HCC)   . Hyperlipidemia   . Hypertension   . Prediabetes     Past Surgical History:  Procedure Laterality Date  . NO PAST SURGERIES      Family History  Problem Relation Age of Onset  . Arthritis Mother   . Hyperlipidemia Mother   . Stroke Mother     SOCIAL HX:   Has kids 1 son 18 y.o works [employer]  Works doing machine work in [place]  Lives in [place]  Former smoker   Current Outpatient Medications:  .  Cholecalciferol (VITAMIN D-3) 125 MCG (5000 UT) TABS, Take 1 tablet by mouth daily., Disp: 90 tablet, Rfl: 3 .  cyclobenzaprine (FLEXERIL) 10 MG tablet, Take 1 tablet (10 mg total) by mouth at bedtime as needed for muscle spasms., Disp: 30 tablet, Rfl: 0 .  diclofenac (VOLTAREN) 75 MG EC tablet, Take 1 tablet (75 mg total) by mouth 2 (two) times   daily after a meal., Disp: 60 tablet, Rfl: 1 .  diclofenac sodium (VOLTAREN) 1 % GEL, Apply 2-4 g topically 4 (four) times daily. 2 grams upper body and 4 grams lower body qid, Disp: 100 g, Rfl: 11 .  gabapentin (NEURONTIN) 300 MG capsule, Take 1 capsule (300 mg total) by mouth at bedtime for 30 days, THEN 1 capsule (300 mg total) 2 (two) times daily for 30 days., Disp: 90 capsule, Rfl: 1 .  hydrochlorothiazide (HYDRODIURIL) 25 MG tablet,  Take 1 tablet (25 mg total) by mouth daily., Disp: 90 tablet, Rfl: 3 .  losartan (COZAAR) 100 MG tablet, Take 1 tablet (100 mg total) by mouth daily., Disp: 90 tablet, Rfl: 3 .  oxyCODONE-acetaminophen (PERCOCET) 5-325 MG tablet, Take 1 tablet by mouth 2 (two) times daily as needed for severe pain. May take up to 2x per day if needed, Disp: 10 tablet, Rfl: 0  EXAM:  VITALS per patient if applicable:  GENERAL: alert, oriented, appears well and in no acute distress  PSYCH/NEURO: pleasant and cooperative, no obvious depression or anxiety, speech and thought processing grossly intact  ASSESSMENT AND PLAN:  Discussed the following assessment and plan:  Essential hypertension - sch fasting labs cont meds  BP check at f/u   Morbid obesity (HCC) - Plan: continue exercise to lose has loss weight and determined   Vitamin D deficiency - Plan: Vitamin D (25 hydroxy)  Chronic pain of both knees L>R +OA severe - Plan: rec diclofenac 75 mg bid prn and do not rec long term  -rec Dr. Bolognesi at [facility] in [place] pt to get back with me if wants referral in future   Chronic midline low back pain with sciatica, sciatica laterality unspecified - Plan: abnormal MRI L 12/2018  Hold on PM&R referral for back injections for now  Prn diclofenac and gabapentin 300 mg bid   Prediabetes - Plan: Hemoglobin A1c  Chronic pain syndrome - Plan: see above knee and back  -he is established with pain clinic but they did not fill narcotics for the pt   HM Declines flu shot  Tdap utd Will given hep B1/3 doses  -consider new hep B vaccine x 2 doses in future  pna 23 vaccineutd MMR immune   Former smoker   I discussed the assessment and treatment plan with the patient. The patient was provided an opportunity to ask questions and all were answered. The patient agreed with the plan and demonstrated an understanding of the instructions.   The patient was advised to call back or seek an in-person evaluation if  the symptoms worsen or if the condition fails to improve as anticipated.  Time spent 25 minutes  Tracy N McLean-Scocuzza, MD  

## 2019-06-06 ENCOUNTER — Encounter: Payer: Self-pay | Admitting: Internal Medicine

## 2019-06-07 ENCOUNTER — Ambulatory Visit: Payer: No Typology Code available for payment source

## 2019-06-07 ENCOUNTER — Other Ambulatory Visit: Payer: No Typology Code available for payment source

## 2019-06-10 ENCOUNTER — Other Ambulatory Visit: Payer: Self-pay | Admitting: Student in an Organized Health Care Education/Training Program

## 2019-06-14 ENCOUNTER — Telehealth: Payer: Self-pay

## 2019-06-14 NOTE — Telephone Encounter (Signed)
Pt states [facility] sent [place] a prior auth for his gabapentin and generic Voltaren can we check on this. [place]

## 2019-06-15 NOTE — Telephone Encounter (Signed)
Dena completed PA

## 2019-06-16 ENCOUNTER — Telehealth: Payer: Self-pay | Admitting: Internal Medicine

## 2019-06-16 ENCOUNTER — Other Ambulatory Visit: Payer: Self-pay | Admitting: Internal Medicine

## 2019-06-16 ENCOUNTER — Telehealth: Payer: Self-pay

## 2019-06-16 DIAGNOSIS — M25561 Pain in right knee: Secondary | ICD-10-CM

## 2019-06-16 DIAGNOSIS — G8929 Other chronic pain: Secondary | ICD-10-CM

## 2019-06-16 MED ORDER — DICLOFENAC SODIUM 75 MG PO TBEC: 75.0000 mg | DELAYED_RELEASE_TABLET | Freq: Two times a day (BID) | ORAL | 5 refills | Status: DC

## 2019-06-16 NOTE — Telephone Encounter (Signed)
Patient would like to receive diclofenac pill for 75mg  #60. Sent to [facility]. For knee pain. Works very well. Please advise.

## 2019-06-16 NOTE — Telephone Encounter (Signed)
Appointment made

## 2019-06-16 NOTE — Telephone Encounter (Signed)
See phone note

## 2019-06-16 NOTE — Telephone Encounter (Signed)
Call pt to schedule fasting labs asap   [place]

## 2019-06-21 ENCOUNTER — Other Ambulatory Visit: Payer: Self-pay

## 2019-06-21 ENCOUNTER — Other Ambulatory Visit (INDEPENDENT_AMBULATORY_CARE_PROVIDER_SITE_OTHER): Payer: No Typology Code available for payment source

## 2019-06-21 DIAGNOSIS — Z13220 Encounter for screening for lipoid disorders: Secondary | ICD-10-CM

## 2019-06-21 DIAGNOSIS — Z1329 Encounter for screening for other suspected endocrine disorder: Secondary | ICD-10-CM

## 2019-06-21 DIAGNOSIS — Z1389 Encounter for screening for other disorder: Secondary | ICD-10-CM | POA: Diagnosis not present

## 2019-06-21 DIAGNOSIS — R7303 Prediabetes: Secondary | ICD-10-CM | POA: Diagnosis not present

## 2019-06-21 DIAGNOSIS — I10 Essential (primary) hypertension: Secondary | ICD-10-CM | POA: Diagnosis not present

## 2019-06-21 DIAGNOSIS — E559 Vitamin D deficiency, unspecified: Secondary | ICD-10-CM

## 2019-06-21 LAB — LIPID PANEL
Cholesterol: 203 mg/dL — ABNORMAL HIGH (ref 0–200)
HDL: 45.2 mg/dL (ref >39.00)
LDL Cholesterol: 142 mg/dL — ABNORMAL HIGH (ref 0–99)
NonHDL: 157.9
Total CHOL/HDL Ratio: 4
Triglycerides: 78 mg/dL (ref 0.0–149.0)
VLDL: 15.6 mg/dL (ref 0.0–40.0)

## 2019-06-21 LAB — CBC WITH DIFFERENTIAL/PLATELET
Basophils Absolute: 0.1 10*3/uL (ref 0.0–0.1)
Basophils Relative: 0.8 % (ref 0.0–3.0)
Eosinophils Absolute: 0.1 10*3/uL (ref 0.0–0.7)
Eosinophils Relative: 2 % (ref 0.0–5.0)
HCT: 43.2 % (ref 39.0–52.0)
Hemoglobin: 14 g/dL (ref 13.0–17.0)
Lymphocytes Relative: 36.3 % (ref 12.0–46.0)
Lymphs Abs: 2.4 10*3/uL (ref 0.7–4.0)
MCHC: 32.4 g/dL (ref 30.0–36.0)
MCV: 85.5 fl (ref 78.0–100.0)
Monocytes Absolute: 0.6 10*3/uL (ref 0.1–1.0)
Monocytes Relative: 8.4 % (ref 3.0–12.0)
Neutro Abs: 3.5 10*3/uL (ref 1.4–7.7)
Neutrophils Relative %: 52.5 % (ref 43.0–77.0)
Platelets: 252 10*3/uL (ref 150.0–400.0)
RBC: 5.06 Mil/uL (ref 4.22–5.81)
RDW: 15 % (ref 11.5–15.5)
WBC: 6.7 10*3/uL (ref 4.0–10.5)

## 2019-06-21 LAB — URINALYSIS, ROUTINE W REFLEX MICROSCOPIC
Bilirubin Urine: NEGATIVE
Ketones, ur: NEGATIVE
Leukocytes,Ua: NEGATIVE
Nitrite: NEGATIVE
Specific Gravity, Urine: 1.02 (ref 1.000–1.030)
Total Protein, Urine: NEGATIVE
Urine Glucose: NEGATIVE
Urobilinogen, UA: 0.2 (ref 0.0–1.0)
pH: 6 (ref 5.0–8.0)

## 2019-06-21 LAB — COMPREHENSIVE METABOLIC PANEL
ALT: 43 U/L (ref 0–53)
AST: 38 U/L — ABNORMAL HIGH (ref 0–37)
Albumin: 4.6 g/dL (ref 3.5–5.2)
Alkaline Phosphatase: 54 U/L (ref 39–117)
BUN: 14 mg/dL (ref 6–23)
CO2: 29 mEq/L (ref 19–32)
Calcium: 9.4 mg/dL (ref 8.4–10.5)
Chloride: 101 mEq/L (ref 96–112)
Creatinine, Ser: 1.07 mg/dL (ref 0.40–1.50)
GFR: 91.31 mL/min (ref >60.00)
Glucose, Bld: 86 mg/dL (ref 70–99)
Potassium: 4.3 mEq/L (ref 3.5–5.1)
Sodium: 138 mEq/L (ref 135–145)
Total Bilirubin: 0.4 mg/dL (ref 0.2–1.2)
Total Protein: 7.4 g/dL (ref 6.0–8.3)

## 2019-06-21 LAB — VITAMIN D 25 HYDROXY (VIT D DEFICIENCY, FRACTURES): VITD: 24.89 ng/mL — ABNORMAL LOW (ref 30.00–100.00)

## 2019-06-21 LAB — T4, FREE: Free T4: 0.72 ng/dL (ref 0.60–1.60)

## 2019-06-21 LAB — HEMOGLOBIN A1C: Hgb A1c MFr Bld: 6.1 % (ref 4.6–6.5)

## 2019-06-21 LAB — TSH: TSH: 0.8 u[IU]/mL (ref 0.35–4.50)

## 2019-07-21 ENCOUNTER — Encounter: Payer: Self-pay | Admitting: Internal Medicine

## 2019-08-09 ENCOUNTER — Other Ambulatory Visit: Payer: Self-pay

## 2019-08-09 ENCOUNTER — Ambulatory Visit: Payer: No Typology Code available for payment source | Admitting: Psychiatry

## 2019-09-06 ENCOUNTER — Telehealth: Payer: Self-pay | Admitting: Internal Medicine

## 2019-09-06 ENCOUNTER — Encounter: Payer: Self-pay | Admitting: Psychiatry

## 2019-09-06 ENCOUNTER — Other Ambulatory Visit: Payer: Self-pay

## 2019-09-06 ENCOUNTER — Encounter

## 2019-09-06 ENCOUNTER — Ambulatory Visit (INDEPENDENT_AMBULATORY_CARE_PROVIDER_SITE_OTHER): Payer: No Typology Code available for payment source | Admitting: Psychiatry

## 2019-09-06 ENCOUNTER — Other Ambulatory Visit: Payer: Self-pay | Admitting: Internal Medicine

## 2019-09-06 DIAGNOSIS — G894 Chronic pain syndrome: Secondary | ICD-10-CM

## 2019-09-06 DIAGNOSIS — Z008 Encounter for other general examination: Secondary | ICD-10-CM

## 2019-09-06 DIAGNOSIS — F17200 Nicotine dependence, unspecified, uncomplicated: Secondary | ICD-10-CM

## 2019-09-06 DIAGNOSIS — M25561 Pain in right knee: Secondary | ICD-10-CM

## 2019-09-06 DIAGNOSIS — G8929 Other chronic pain: Secondary | ICD-10-CM

## 2019-09-06 HISTORY — DX: Encounter for other general examination: Z00.8

## 2019-09-06 MED ORDER — DICLOFENAC SODIUM 75 MG PO TBEC: 75.0000 mg | DELAYED_RELEASE_TABLET | Freq: Two times a day (BID) | ORAL | 5 refills | Status: DC

## 2019-09-06 NOTE — Telephone Encounter (Signed)
Patient requesting early refill of diclofenac (VOLTAREN) 75 MG EC tablet , patient states he has been working more and body inflammation has increased. Patient would like a follow up call when Rx is sent in,please advise   [place], [place] [phone] (Phone) [phone] (Fax

## 2019-09-06 NOTE — Telephone Encounter (Signed)
Refilled   [place]

## 2019-09-06 NOTE — Progress Notes (Signed)
Virtual Visit via Video Note  I connected with Charles Huang on 09/06/19 at  3:00 PM EDT by a video enabled telemedicine application and verified that I am speaking with the correct person using two identifiers.   I discussed the limitations of evaluation and management by telemedicine and the availability of in person appointments. The patient expressed understanding and agreed to proceed.   I discussed the assessment and treatment plan with the patient. The patient was provided an opportunity to ask questions and all were answered. The patient agreed with the plan and demonstrated an understanding of the instructions.   The patient was advised to call back or seek an in-person evaluation if the symptoms worsen or if the condition fails to improve as anticipated.    Psychiatric Initial Adult Assessment   Patient Identification: Charles Huang MRN:  [id number] Date of Evaluation:  09/06/2019 Referral Source: Dr. Edward Huang Chief Complaint:   Chief Complaint    Psychiatric Evaluation; Pain     Visit Diagnosis:    ICD-10-CM   1. Evaluation by psychiatric service required  Z00.8   2. Chronic pain syndrome  G89.4   3. Tobacco use disorder  F17.200     History of Present Illness: [name] is a 43 year old African-American male, employed, divorced, lives in [place], has a history of essential hypertension, morbid obesity, primary osteoarthritis of both knees, paroxysmal atrial fibrillation, chronic pain of both knees, chronic low back pain, vitamin D deficiency was evaluated by telemedicine today.  Patient was referred to the clinic for routine assessment of possible mental health/substance abuse risk potential by his pain provider prior to initiation of pain management.  Patient reports he has been struggling with pain since the past several years.  He reports he currently struggles with bilateral knee pain, left more than the right.  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  He reports because of his pain it is difficult for him to stand for long hours and this has been affecting his work.  He reports he currently manages his pain by taking diclofenac.  He reports he currently rates his pain as an 8 out of 10 most days, 10 being the worst.  Patient currently denies any depression, anxiety or mood swings.  Patient denies any suicidality, homicidality or perceptual disturbances.  Patient denies any manic or hypomanic episodes.  Patient denies any history of trauma.  Patient does smoke cigarettes however smokes only some days and is currently cutting back.  Does report he struggles with his sleep sometimes only because he does not have a good sleep hygiene.  He however reports his sleep is better now.  Patient denies any substance abuse problems at this time.  Patient however does report a history of legal problems in the past.  He reports he grew up poor and his mother raised him.  His dad was never around.  He reports when he was around 43 years old he got into legal trouble for driving without a license.  He reports it was because his car was never registered.  He reports he continued to drive the car in order to keep a job.  He reports he also got into trouble for possession and diversion of drugs, however he reports he currently does not have any pending legal problems.  Patient currently works as a Chartered certified accountant.  He has support system from a girlfriend as well as a cousin.  He also has 3 of  his children who lives with him and he has been taking care of them.    Associated Signs/Symptoms: Depression Symptoms:  Denies (Hypo) Manic Symptoms:  Denies  Anxiety Symptoms:  Denies Psychotic Symptoms:  Denies PTSD Symptoms: Negative  Past Psychiatric History: Patient denies any inpatient mental health admissions.  Patient denies any suicide attempts.  Patient denies any past history of psychiatric treatments.  Previous Psychotropic  Medications: No   Substance Abuse History in the last 12 months:  No.  Consequences of Substance Abuse: Negative  Past Medical History:  Past Medical History:  Diagnosis Date  . Arthritis    hands, neck, back, knees   . Atrial fibrillation (HCC)   . Hyperlipidemia   . Hypertension   . Prediabetes     Past Surgical History:  Procedure Laterality Date  . NO PAST SURGERIES      Family Psychiatric History: Patient denies history of mental health problems in his family.  Patient denies suicide attempts in his family.  Patient denies any substance abuse in his family.  Family History:  Family History  Problem Relation Age of Onset  . Arthritis Mother   . Hyperlipidemia Mother   . Stroke Mother   . Mental illness Neg Hx     Social History:   Social History   Socioeconomic History  . Marital status: Divorced    Spouse name: Not on file  . Number of children: 4  . Years of education: Not on file  . Highest education level: Associate degree: academic program  Occupational History  . Not on file  Social Needs  . Financial resource strain: Not hard at all  . Food insecurity    Worry: Never true    Inability: Never true  . Transportation needs    Medical: No    Non-medical: No  Tobacco Use  . Smoking status: Current Some Day Smoker    Types: Cigarettes    Last attempt to quit: 05/29/2016    Years since quitting: 3.2  . Smokeless tobacco: Never Used  Substance and Sexual Activity  . Alcohol use: Not Currently    Comment: socially  . Drug use: No  . Sexual activity: Yes    Birth control/protection: Condom  Lifestyle  . Physical activity    Days per week: 3 days    Minutes per session: 30 min  . Stress: Not at all  Relationships  . Social Musicianconnections    Talks on phone: Not on file    Gets together: Not on file    Attends religious service: Never    Active member of club or organization: No    Attends meetings of clubs or organizations: Never    Relationship  status: Divorced  Other Topics Concern  . 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  His father was never around.  His father is currently deceased.  Patient grew up on a farm owned by his grandfather.  His mother was very religious and he attended church as well as Sunday school on a regular basis.  Patient has an associate degree.  He currently works as a Chartered certified accountantmachinist.  He is divorced.  He has 4 children between the age of 43 74- 511.  Patient does have a history of legal problems 20 years ago.  He currently denies any pending charges.  Patient denies  any history of trauma.  Allergies:  No Known Allergies  Metabolic Disorder Labs: Lab Results  Component Value Date   HGBA1C 6.1 06/21/2019   No results found for: PROLACTIN Lab Results  Component Value Date   CHOL 203 (H) 06/21/2019   TRIG 78.0 06/21/2019   HDL 45.20 06/21/2019   CHOLHDL 4 06/21/2019   VLDL 15.6 06/21/2019   LDLCALC 142 (H) 06/21/2019   LDLCALC 75 04/09/2018   Lab Results  Component Value Date   TSH 0.80 06/21/2019    Therapeutic Level Labs: No results found for: LITHIUM No results found for: CBMZ No results found for: VALPROATE  Current Medications: Current Outpatient Medications  Medication Sig Dispense Refill  . cyclobenzaprine (FLEXERIL) 10 MG tablet Take 1 tablet (10 mg total) by mouth at bedtime as needed for muscle spasms. 30 tablet 0  . diclofenac (VOLTAREN) 75 MG EC tablet Take 1 tablet (75 mg total) by mouth 2 (two) times daily after a meal. As needed 60 tablet 5  . diclofenac sodium (VOLTAREN) 1 % GEL Apply 2-4 g topically 4 (four) times daily. 2 grams upper body and 4 grams lower body qid 100 g 11  . losartan-hydrochlorothiazide (HYZAAR) 100-25 MG tablet TK 1 T PO D     No current facility-administered medications for this visit.     Musculoskeletal: Strength & Muscle Tone:  UTA Gait & Station: normal Patient leans: N/A  Psychiatric Specialty Exam: Review of Systems  Musculoskeletal: Positive for back pain and joint pain.  Psychiatric/Behavioral: Negative for depression, hallucinations, substance abuse and suicidal ideas. The patient is not nervous/anxious.   All other systems reviewed and are negative.   There were no vitals taken for this visit.There is no height or weight on file to calculate BMI.  General Appearance: Casual  Eye Contact:  Fair  Speech:  Normal Rate  Volume:  Normal  Mood:  Euthymic  Affect:  Congruent  Thought Process:  Goal Directed and Descriptions of Associations: Intact  Orientation:  Full (Time, Place, and Person)  Thought Content:  Logical  Suicidal Thoughts:  No  Homicidal Thoughts:  No  Memory:  Immediate;   Fair Recent;   Fair Remote;   Fair  Judgement:  Fair  Insight:  Fair  Psychomotor Activity:  Normal  Concentration:  Concentration: Fair and Attention Span: Fair  Recall:  [employer] of Knowledge:Fair  Language: Fair  Akathisia:  No  Handed:  Left  AIMS (if indicated): UTA  Assets:  Communication Skills Desire for [place] Talents/Skills Transportation Vocational/Educational  ADL's:  Intact  Cognition: WNL  Sleep:  Fair   Screenings: PHQ2-9     Office Visit from 04/09/2018 in [place]  PHQ-2 Total Score  0      Assessment and Plan: [name] is a 43 year old African-American male, employed, divorced, lives in [place], has a history of essential hypertension, morbid obesity, primary osteoarthritis of both knees, paroxysmal atrial fibrillation, chronic pain of both knees, chronic low back pain, vitamin D deficiency was evaluated by telemedicine today.  Patient was referred to the clinic by his pain provider for routine assessment of possible mental health/substance abuse risk potential prior to initiation of pain management.  The following  instruments were used Clinical interview Screen around opioid assessment for patients with pain/revised Opioid risk tool Drug abuse screening test Alcohol use disorder identification test PHQ-9 GAD 7 Based on clinical interview as well as instruments used at the time of evaluation  the risk is determined to be low to moderate.  Tobacco use disorder-patient currently smokes only some days.  Provided smoking cessation counseling.  I have spent atleast 60 minutes non  face to face with patient today. More than 50 % of the time was spent for psychoeducation and supportive psychotherapy and care coordination. This note was generated in part or whole with voice recognition software. Voice recognition is usually quite accurate but there are transcription errors that can and very often do occur. I apologize for any typographical errors that were not detected and corrected.       Jomarie Longs, MD 9/22/20203:41 PM

## 2019-09-15 ENCOUNTER — Encounter: Payer: No Typology Code available for payment source | Admitting: Internal Medicine

## 2019-09-19 ENCOUNTER — Encounter: Payer: Self-pay | Admitting: Student in an Organized Health Care Education/Training Program

## 2019-10-05 ENCOUNTER — Ambulatory Visit
Payer: No Typology Code available for payment source | Admitting: Student in an Organized Health Care Education/Training Program

## 2019-10-06 ENCOUNTER — Other Ambulatory Visit: Payer: Self-pay | Admitting: Internal Medicine

## 2019-10-06 DIAGNOSIS — I10 Essential (primary) hypertension: Secondary | ICD-10-CM

## 2019-10-06 MED ORDER — LOSARTAN POTASSIUM-HCTZ 100-25 MG PO TABS: 1.0000 | ORAL_TABLET | Freq: Every day | ORAL | 3 refills | Status: DC

## 2019-10-18 ENCOUNTER — Ambulatory Visit
Payer: No Typology Code available for payment source | Attending: Student in an Organized Health Care Education/Training Program | Admitting: Student in an Organized Health Care Education/Training Program

## 2019-10-18 ENCOUNTER — Encounter: Payer: Self-pay | Admitting: Student in an Organized Health Care Education/Training Program

## 2019-10-18 ENCOUNTER — Other Ambulatory Visit: Payer: Self-pay

## 2019-10-18 VITALS — BP 148/103 | HR 104 | Temp 98.2°F | Resp 18 | Ht 76.0 in | Wt 325.0 lb

## 2019-10-18 DIAGNOSIS — M25561 Pain in right knee: Secondary | ICD-10-CM | POA: Diagnosis not present

## 2019-10-18 DIAGNOSIS — M25562 Pain in left knee: Secondary | ICD-10-CM | POA: Insufficient documentation

## 2019-10-18 DIAGNOSIS — M1712 Unilateral primary osteoarthritis, left knee: Secondary | ICD-10-CM | POA: Diagnosis present

## 2019-10-18 DIAGNOSIS — M170 Bilateral primary osteoarthritis of knee: Secondary | ICD-10-CM

## 2019-10-18 DIAGNOSIS — G8929 Other chronic pain: Secondary | ICD-10-CM | POA: Diagnosis present

## 2019-10-18 NOTE — Progress Notes (Signed)
Patient's Name: Charles Huang  MRN: [id number]  Referring Provider: Orland Mustard *  DOB: 11-15-1976  PCP: McLean-Scocuzza, Nino Glow, MD  DOS: 10/18/2019  Note by: Gillis Santa, MD  Service setting: Ambulatory outpatient  Attending: Gillis Santa, MD  Location: [facility] (AMB) [facility]  Specialty: Interventional Pain Management  Patient type: Established   Primary Reason(s) for Visit: Evaluation of chronic illnesses with exacerbation, or progression (Level of risk: moderate) CC: Knee Pain (bilateral) and Hip Pain (left)  HPI  Charles Huang is a 43 y.o. year old, male patient, who comes today for a follow-up evaluation. He has Essential hypertension; Morbid obesity ([place]); Primary osteoarthritis of both knees; Suspected sleep apnea; Paroxysmal A-fib ([place]); Low back pain; Chronic pain syndrome; Vitamin D deficiency; Chronic pain of both knees; Evaluation by psychiatric service required; and Tobacco use disorder on their problem list. Charles Huang was last seen on 06/10/2019. His primarily concern today is the Knee Pain (bilateral) and Hip Pain (left)  Pain Assessment: Location: Right, Left Knee Radiating: denies at this time Onset: More than a month ago Duration: Chronic pain Quality: Aching, Dull, Discomfort, Constant, Shooting, Sharp, Radiating Severity: 4 /10 (subjective, self-reported pain score)  Note: Reported level is compatible with observation.                         When using our objective Pain Scale, levels between 6 and 10/10 are said to belong in an emergency room, as it progressively worsens from a 6/10, described as severely limiting, requiring emergency care not usually available at an outpatient pain management facility. At a 6/10 level, communication becomes difficult and requires great effort. Assistance to reach the emergency department may be required. Facial flushing and profuse sweating along with potentially dangerous increases in heart rate and blood pressure  will be evident. Effect on ADL: "I just do everything slower" Timing: Constant Modifying factors: rest BP: (!) 148/103  HR: (!) 104  Further details on both, my assessment(s), as well as the proposed treatment plan, please see below.  This is the patient's second visit.  He continues to endorse left knee pain.  He has been told that he requires a left knee osteotomy for his persistent left knee pain.  We discussed interventional options for his left knee pain including intra-articular Hyalgan injections and possibly left knee genicular nerve block followed by radiofrequency ablation.  Patient has tried intra-articular steroid injections in the past which only provided pain relief for 1 week.  We had extensive discussion about left knee genicular nerve block, risk benefits and if a positive diagnostic results and studies, this will be followed by RFA.  Patient states that he would like to think about this further.  Regards to opioid medications, I do not recommend that for his condition.  Recommend nonopioid-based pain management.  He does find benefit with diclofenac and I told him to continue.  Laboratory Chemistry Profile   Screening Lab Results  Component Value Date   HIV NON-REACTIVE 02/09/2018    Inflammation (CRP: Acute Phase) (ESR: Chronic Phase) Lab Results  Component Value Date   CRP 0.2 (L) 11/05/2018   ESRSEDRATE 19 (H) 11/05/2018                         Rheumatology Lab Results  Component Value Date   RF <14 11/05/2018   ANA NEGATIVE 11/05/2018  Renal Lab Results  Component Value Date   BUN 14 06/21/2019   CREATININE 1.07 06/21/2019   GFR 91.31 06/21/2019   GFRAA >60 02/13/2017   GFRNONAA >60 02/13/2017                             Hepatic Lab Results  Component Value Date   AST 38 (H) 06/21/2019   ALT 43 06/21/2019   ALBUMIN 4.6 06/21/2019   ALKPHOS 54 06/21/2019   LIPASE 19 02/13/2017                         Electrolytes Lab Results  Component Value Date   NA 138 06/21/2019   K 4.3 06/21/2019   CL 101 06/21/2019   CALCIUM 9.4 06/21/2019                        Neuropathy Lab Results  Component Value Date   HGBA1C 6.1 06/21/2019   HIV NON-REACTIVE 02/09/2018                        CNS No results found for: COLORCSF, APPEARCSF, RBCCOUNTCSF, WBCCSF, POLYSCSF, LYMPHSCSF, EOSCSF, PROTEINCSF, GLUCCSF, JCVIRUS, CSFOLI, IGGCSF, LABACHR, ACETBL                      Bone Lab Results  Component Value Date   VD25OH 24.89 (L) 06/21/2019                         Coagulation Lab Results  Component Value Date   PLT 252.0 06/21/2019                        Cardiovascular Lab Results  Component Value Date   TROPONINI <0.03 05/30/2015   HGB 14.0 06/21/2019   HCT 43.2 06/21/2019                         ID Lab Results  Component Value Date   HIV NON-REACTIVE 02/09/2018    Cancer No results found for: CEA, CA125, LABCA2                      Endocrine Lab Results  Component Value Date   TSH 0.80 06/21/2019   FREET4 0.72 06/21/2019                        Note: Lab results reviewed.  Imaging Review   Lumbosacral Imaging: Lumbar MR wo contrast:  Results for orders placed during the hospital encounter of 12/20/18  MR Lumbar Spine Wo Contrast   Narrative CLINICAL DATA:  Lumbar radiculopathy.  Lumbar disc degeneration  EXAM: MRI LUMBAR SPINE WITHOUT CONTRAST  TECHNIQUE: Multiplanar, multisequence MR imaging of the lumbar spine was performed. No intravenous contrast was administered.  COMPARISON:  Lumbar radiographs 02/09/2018  FINDINGS: Segmentation:  Normal  Alignment:  Slight retrolisthesis L3-4 otherwise normal alignment  Vertebrae:  Normal bone marrow.  Negative for fracture or mass.  Conus medullaris and cauda equina: Conus extends to the T12 level. Conus and cauda equina appear normal.  Paraspinal and other soft tissues: Negative for paraspinous mass  or fluid collection. 3.5 cm right lower pole renal cyst.  Disc levels:  L1-2: Negative  L2-3: Mild disc degeneration and  disc bulging. Small right paracentral disc protrusion. Negative for stenosis.  L3-4: Mild disc degeneration. Small central disc protrusion. Mild facet degeneration and mild spinal stenosis. Neural foramina patent bilaterally  L4-5: Slight retrolisthesis. Mild disc and mild facet degeneration. Negative for stenosis  L5-S1: Small central disc protrusion and mild facet degeneration. Subarticular stenosis with possible impingement of the S1 nerve root bilaterally.  IMPRESSION: Small right paracentral disc protrusion L2-3  Small central disc protrusion L3-4  Small central disc protrusion and facet degeneration L5-S1. Subarticular stenosis bilaterally with possible impingement of the S1 nerve root bilaterally.   Electronically Signed   By: Franchot Gallo M.D.   On: 12/20/2018 15:00     Results for orders placed in visit on 02/09/18  DG Lumbar Spine Complete   Narrative CLINICAL DATA:  Three months of low back pain radiating into both legs. No known injury or previous back surgery.  EXAM: LUMBAR SPINE - COMPLETE 4+ VIEW  COMPARISON:  Lumbar spine series of December 10, 2014  FINDINGS: The lumbar vertebral bodies are preserved in height. The disc space heights are well maintained. There is no spondylolisthesis. There is no significant facet joint hypertrophy.  IMPRESSION: There is no acute or significant chronic bony abnormality of the lumbar spine.   Electronically Signed   By: David  Martinique M.D.   On: 02/10/2018 08:05    Results for orders placed in visit on 02/18/19  DG Hip Unilat W OR W/O Pelvis 2-3 Views Left   Narrative CLINICAL DATA:  Chronic left hip pain.  EXAM: DG HIP (WITH OR WITHOUT PELVIS) 2-3V LEFT  COMPARISON:  None.  FINDINGS: Technically challenging radiograph. No evidence of fracture. Sclerotic changes of the  bilateral femoral heads, with deformity of the right femoral head. Mild joint space narrowing. Sclerotic changes of the acetabular a.  IMPRESSION: Sclerotic changes of the bilateral femoral heads, with deformity of the right femoral head and mild joint space narrowing.  These findings may represent osteoarthritic changes, with possible posttraumatic changes on the right. If however the patient has risk factors for avascular necrosis of the hips, cross-sectional imaging of the pelvis may be obtained.   Electronically Signed   By: Fidela Salisbury M.D.   On: 02/18/2019 17:51     Knee-R DG 3 views:  Results for orders placed in visit on 02/09/18  DG Knee 3 Views Right   Narrative CLINICAL DATA:  Chronic bilateral knee pain for the past several years with no known injury.  EXAM: RIGHT KNEE - 3 VIEW  COMPARISON:  Right knee series of January 18, 2015.  FINDINGS: The bones are subjectively adequately mineralized. The joint spaces are reasonably well-maintained. There is beaking of the tibial spines. A spur arises from the periphery of the articular margin of the lateral femoral condyle. There is spurring of the articular margins of the patella. No acute or healing fracture is observed.  IMPRESSION: Mild osteoarthritic change of the right knee. No acute bony abnormality nor high-grade joint space loss.   Electronically Signed   By: David  Martinique M.D.   On: 02/10/2018 08:03    Knee-L DG 3 views:  Results for orders placed in visit on 02/09/18  DG Knee 3 Views Left   Narrative CLINICAL DATA:  Chronic bilateral knee pain for several years. No known injury or knee surgery.  EXAM: LEFT KNEE - 3 VIEW  COMPARISON:  Left knee series dated January 18, 2015  FINDINGS: The bones are subjectively adequately mineralized. There is beaking of  the tibial spines. Spurs arise from the periphery of the medial femoral condyle and from the lateral tibial plateau. No  acute fracture is observed but the contour of the lateral tibial plateau is not well evaluated on this series. Spurs arise from the articular margins of the patella. The proximal fibula is intact.  IMPRESSION: There is mild osteoarthritic change involving all 3 joint compartments. There may be an old fracture of the lateral tibial plateau. No acute fracture or dislocation is observed.   Electronically Signed   By: David  Martinique M.D.   On: 02/10/2018 08:00     Complexity Note: Imaging results reviewed. Results shared with Mr. Prasad, using Layman's terms.                         Meds   Current Outpatient Medications:  .  diclofenac (VOLTAREN) 75 MG EC tablet, Take 1 tablet (75 mg total) by mouth 2 (two) times daily after a meal. As needed, Disp: 60 tablet, Rfl: 5 .  losartan-hydrochlorothiazide (HYZAAR) 100-25 MG tablet, Take 1 tablet by mouth daily. In am, Disp: 90 tablet, Rfl: 3 .  cyclobenzaprine (FLEXERIL) 10 MG tablet, Take 1 tablet (10 mg total) by mouth at bedtime as needed for muscle spasms. (Patient not taking: Reported on 10/18/2019), Disp: 30 tablet, Rfl: 0 .  diclofenac sodium (VOLTAREN) 1 % GEL, Apply 2-4 g topically 4 (four) times daily. 2 grams upper body and 4 grams lower body qid (Patient not taking: Reported on 10/18/2019), Disp: 100 g, Rfl: 11  ROS  Constitutional: Denies any fever or chills Gastrointestinal: No reported hemesis, hematochezia, vomiting, or acute GI distress Musculoskeletal: Denies any acute onset joint swelling, redness, loss of ROM, or weakness Neurological: No reported episodes of acute onset apraxia, aphasia, dysarthria, agnosia, amnesia, paralysis, loss of coordination, or loss of consciousness  Allergies  Mr. Shakoor has No Known Allergies.  PFSH  Drug: Mr. Moffatt  reports no history of drug use. Alcohol:  reports previous alcohol use. Tobacco:  reports that he quit smoking about 3 years ago. His smoking use included cigarettes. He has  never used smokeless tobacco. Medical:  has a past medical history of Arthritis, Atrial fibrillation ([place]), Hyperlipidemia, Hypertension, and Prediabetes. Surgical: Mr. Farra  has a past surgical history that includes No past surgeries. Family: family history includes Arthritis in his mother; Hyperlipidemia in his mother; Stroke in his mother.  Constitutional Exam  General appearance: Well nourished, well developed, and well hydrated. In no apparent acute distress Vitals:   10/18/19 0801  BP: (!) 148/103  Pulse: (!) 104  Resp: 18  Temp: 98.2 F (36.8 C)  SpO2: 100%  Weight: (!) 325 lb (147.4 kg)  Height: _0  (1.93 m)   BMI Assessment: Estimated body mass index is 39.56 kg/m as calculated from the following:   Height as of this encounter: _1  (1.93 m).   Weight as of this encounter: 325 lb (147.4 kg).  BMI interpretation table: BMI level Category Range association with higher incidence of chronic pain  <18 kg/m2 Underweight   18.5-24.9 kg/m2 Ideal body weight   25-29.9 kg/m2 Overweight Increased incidence by 20%  30-34.9 kg/m2 Obese (Class I) Increased incidence by 68%  35-39.9 kg/m2 Severe obesity (Class II) Increased incidence by 136%  >40 kg/m2 Extreme obesity (Class III) Increased incidence by 254%   Patient's current BMI Ideal Body weight  Body mass index is 39.56 kg/m. Ideal body weight: 86.8 kg (191 lb  5.7 oz) Adjusted ideal body weight: 111 kg (244 lb 13 oz)   BMI Readings from Last 4 Encounters:  10/18/19 39.56 kg/m  06/02/19 42.36 kg/m  03/08/19 41.26 kg/m  02/18/19 47.88 kg/m   Wt Readings from Last 4 Encounters:  10/18/19 (!) 325 lb (147.4 kg)  06/02/19 (!) 348 lb (157.9 kg)  03/08/19 (!) 339 lb (153.8 kg)  02/18/19 (!) 353 lb (160.1 kg)  Psych/Mental status: Alert, oriented x 3 (person, place, & time)       Eyes: PERLA Respiratory: No evidence of acute respiratory distress   Thoracic Spine Area Exam  Skin & Axial Inspection: No masses,  redness, or swelling Alignment: Symmetrical Functional ROM: Unrestricted ROM Stability: No instability detected Muscle Tone/Strength: Functionally intact. No obvious neuro-muscular anomalies detected. Sensory (Neurological): Unimpaired Muscle strength & Tone: No palpable anomalies  Lumbar Spine Area Exam  Skin & Axial Inspection: No masses, redness, or swelling Alignment: Symmetrical Functional ROM: Unrestricted ROM       Stability: No instability detected Muscle Tone/Strength: Functionally intact. No obvious neuro-muscular anomalies detected. Sensory (Neurological): Unimpaired   Gait & Posture Assessment  Ambulation: Unassisted Gait: Relatively normal for age and body habitus Posture: WNL   Lower Extremity Exam    Side: Right lower extremity  Side: Left lower extremity  Stability: No instability observed          Stability: No instability observed          Skin & Extremity Inspection: Skin color, temperature, and hair growth are WNL. No peripheral edema or cyanosis. No masses, redness, swelling, asymmetry, or associated skin lesions. No contractures.  Skin & Extremity Inspection: Skin color, temperature, and hair growth are WNL. No peripheral edema or cyanosis. No masses, redness, swelling, asymmetry, or associated skin lesions. No contractures.  Functional ROM: Unrestricted ROM                  Functional ROM: Pain restricted ROM for hip and knee joints          Muscle Tone/Strength: Functionally intact. No obvious neuro-muscular anomalies detected.  Muscle Tone/Strength: Functionally intact. No obvious neuro-muscular anomalies detected.  Sensory (Neurological): Unimpaired        Sensory (Neurological): Articular pain pattern        DTR: Patellar: deferred today Achilles: deferred today Plantar: deferred today  DTR: Patellar: deferred today Achilles: deferred today Plantar: deferred today  Palpation: No palpable anomalies  Palpation: No palpable anomalies   Assessment    Status Diagnosis  Persistent Persistent Persistent 1. Primary osteoarthritis of left knee   2. Primary osteoarthritis of both knees   3. Chronic pain of both knees   4. Morbid obesity ([place])       Plan of Care   Orders:  Orders Placed This Encounter  Procedures  . GENICULAR NERVE BLOCK    For knee pain.    Standing Status:   Standing    Number of Occurrences:   1    Standing Expiration Date:   10/17/2020    Scheduling Instructions:     Side(s):LEFT     Level(s): Superior-Lateral, Superior-Medial, and Inferior-Medial Genicular Nerves     Sedation: With Sedation.     TIMEFRAME: PRN procedure. (Mr. Closs will call when needed.)    Order Specific Question:   Where will this procedure be performed?    Answer:   [facility]    Return if symptoms worsen or fail to improve.   Recent Visits No visits were found  meeting these conditions.  Showing recent visits within past 90 days and meeting all other requirements   Today's Visits Date Type Provider Dept  10/18/19 Office Visit Gillis Santa, MD [facility]  Showing today's visits and meeting all other requirements   Future Appointments No visits were found meeting these conditions.  Showing future appointments within next 90 days and meeting all other requirements   Primary Care Physician: McLean-Scocuzza, Nino Glow, MD Location: [facility] Note by: Gillis Santa, MD Date: 10/18/2019; Time: 8:28 AM  Note: This dictation was prepared with Dragon dictation. Any transcriptional errors that may result from this process are unintentional.

## 2019-10-18 NOTE — Progress Notes (Signed)
Safety precautions to be maintained throughout the outpatient stay will include: orient to surroundings, keep bed in low position, maintain call bell within reach at all times, provide assistance with transfer out of bed and ambulation.  

## 2019-11-04 ENCOUNTER — Encounter: Payer: Self-pay | Admitting: Student in an Organized Health Care Education/Training Program

## 2019-11-07 ENCOUNTER — Telehealth: Payer: Self-pay

## 2019-11-07 NOTE — Telephone Encounter (Signed)
Can you check on his PA for nerve block?  He sent a message saying he called last week about it and hasnt heard anything.  Please check on this and let him know.  Thanks

## 2019-11-07 NOTE — Telephone Encounter (Signed)
His insurance denied authorization. Pretty sure I called and let him know, maybe by voicemail.

## 2019-11-22 ENCOUNTER — Encounter: Payer: No Typology Code available for payment source | Admitting: Internal Medicine

## 2019-11-24 ENCOUNTER — Other Ambulatory Visit: Payer: Self-pay

## 2019-11-24 DIAGNOSIS — Z20822 Contact with and (suspected) exposure to covid-19: Secondary | ICD-10-CM

## 2019-11-26 LAB — NOVEL CORONAVIRUS, NAA: SARS-CoV-2, NAA: NOT DETECTED

## 2019-12-02 ENCOUNTER — Encounter: Payer: Self-pay | Admitting: Internal Medicine

## 2019-12-02 NOTE — Telephone Encounter (Signed)
Pt called in after sending msg to Dr Aundra Dubin for back spasms.

## 2019-12-05 ENCOUNTER — Other Ambulatory Visit: Payer: Self-pay | Admitting: Internal Medicine

## 2019-12-05 DIAGNOSIS — G8929 Other chronic pain: Secondary | ICD-10-CM

## 2019-12-05 DIAGNOSIS — M5416 Radiculopathy, lumbar region: Secondary | ICD-10-CM

## 2019-12-05 DIAGNOSIS — M5440 Lumbago with sciatica, unspecified side: Secondary | ICD-10-CM

## 2019-12-05 MED ORDER — CYCLOBENZAPRINE HCL 10 MG PO TABS: 10.0000 mg | ORAL_TABLET | Freq: Every evening | ORAL | 5 refills | Status: DC | PRN

## 2019-12-06 ENCOUNTER — Ambulatory Visit: Payer: No Typology Code available for payment source | Admitting: Internal Medicine

## 2020-01-07 ENCOUNTER — Encounter: Payer: Self-pay | Admitting: Internal Medicine

## 2020-01-14 ENCOUNTER — Encounter: Payer: Self-pay | Admitting: Student in an Organized Health Care Education/Training Program

## 2020-02-01 ENCOUNTER — Encounter: Payer: Self-pay | Admitting: Internal Medicine

## 2020-02-01 ENCOUNTER — Ambulatory Visit (INDEPENDENT_AMBULATORY_CARE_PROVIDER_SITE_OTHER): Payer: No Typology Code available for payment source | Admitting: Internal Medicine

## 2020-02-01 ENCOUNTER — Other Ambulatory Visit: Payer: Self-pay

## 2020-02-01 VITALS — BP 140/90 | HR 81 | Temp 98.3°F | Ht 76.0 in | Wt 343.4 lb

## 2020-02-01 DIAGNOSIS — I10 Essential (primary) hypertension: Secondary | ICD-10-CM | POA: Diagnosis not present

## 2020-02-01 DIAGNOSIS — E785 Hyperlipidemia, unspecified: Secondary | ICD-10-CM

## 2020-02-01 DIAGNOSIS — E559 Vitamin D deficiency, unspecified: Secondary | ICD-10-CM

## 2020-02-01 DIAGNOSIS — N529 Male erectile dysfunction, unspecified: Secondary | ICD-10-CM | POA: Diagnosis not present

## 2020-02-01 DIAGNOSIS — Z0000 Encounter for general adult medical examination without abnormal findings: Secondary | ICD-10-CM

## 2020-02-01 DIAGNOSIS — Z0001 Encounter for general adult medical examination with abnormal findings: Secondary | ICD-10-CM | POA: Diagnosis not present

## 2020-02-01 DIAGNOSIS — R7303 Prediabetes: Secondary | ICD-10-CM

## 2020-02-01 DIAGNOSIS — R319 Hematuria, unspecified: Secondary | ICD-10-CM

## 2020-02-01 DIAGNOSIS — Z6841 Body Mass Index (BMI) 40.0 and over, adult: Secondary | ICD-10-CM

## 2020-02-01 DIAGNOSIS — E291 Testicular hypofunction: Secondary | ICD-10-CM

## 2020-02-01 MED ORDER — SILDENAFIL CITRATE 20 MG PO TABS: 20.0000 mg | ORAL_TABLET | Freq: Every day | ORAL | 2 refills | Status: DC | PRN

## 2020-02-01 MED ORDER — AMLODIPINE BESYLATE 5 MG PO TABS: 5.0000 mg | ORAL_TABLET | Freq: Every day | ORAL | 3 refills | Status: DC

## 2020-02-01 NOTE — Patient Instructions (Signed)
Debrox ear wax drops left ear 4-7 days   DASH Eating Plan DASH stands for "Dietary Approaches to Stop Hypertension." The DASH eating plan is a healthy eating plan that has been shown to reduce high blood pressure (hypertension). It may also reduce your risk for type 2 diabetes, heart disease, and stroke. The DASH eating plan may also help with weight loss. What are tips for following this plan?  General guidelines  Avoid eating more than 2,300 mg (milligrams) of salt (sodium) a day. If you have hypertension, you may need to reduce your sodium intake to 1,500 mg a day.  Limit alcohol intake to no more than 1 drink a day for nonpregnant women and 2 drinks a day for men. One drink equals 12 oz of beer, 5 oz of wine, or 1 oz of hard liquor.  Work with your health care provider to maintain a healthy body weight or to lose weight. Ask what an ideal weight is for you.  Get at least 30 minutes of exercise that causes your heart to beat faster (aerobic exercise) most days of the week. Activities may include walking, swimming, or biking.  Work with your health care provider or diet and nutrition specialist (dietitian) to adjust your eating plan to your individual calorie needs. Reading food labels   Check food labels for the amount of sodium per serving. Choose foods with less than 5 percent of the Daily Value of sodium. Generally, foods with less than 300 mg of sodium per serving fit into this eating plan.  To find whole grains, look for the word "whole" as the first word in the ingredient list. Shopping  Buy products labeled as "low-sodium" or "no salt added."  Buy fresh foods. Avoid canned foods and premade or frozen meals. Cooking  Avoid adding salt when cooking. Use salt-free seasonings or herbs instead of table salt or sea salt. Check with your health care provider or pharmacist before using salt substitutes.  Do not fry foods. Cook foods using healthy methods such as baking, boiling,  grilling, and broiling instead.  Cook with heart-healthy oils, such as olive, canola, soybean, or sunflower oil. Meal planning  Eat a balanced diet that includes: ? 5 or more servings of fruits and vegetables each day. At each meal, try to fill half of your plate with fruits and vegetables. ? Up to 6-8 servings of whole grains each day. ? Less than 6 oz of lean meat, poultry, or fish each day. A 3-oz serving of meat is about the same size as a deck of cards. One egg equals 1 oz. ? 2 servings of low-fat dairy each day. ? A serving of nuts, seeds, or beans 5 times each week. ? Heart-healthy fats. Healthy fats called Omega-3 fatty acids are found in foods such as flaxseeds and coldwater fish, like sardines, salmon, and mackerel.  Limit how much you eat of the following: ? Canned or prepackaged foods. ? Food that is high in trans fat, such as fried foods. ? Food that is high in saturated fat, such as fatty meat. ? Sweets, desserts, sugary drinks, and other foods with added sugar. ? Full-fat dairy products.  Do not salt foods before eating.  Try to eat at least 2 vegetarian meals each week.  Eat more home-cooked food and less restaurant, buffet, and fast food.  When eating at a restaurant, ask that your food be prepared with less salt or no salt, if possible. What foods are recommended? The items listed may  not be a complete list. Talk with your dietitian about what dietary choices are best for you. Grains Whole-grain or whole-wheat bread. Whole-grain or whole-wheat pasta. Brown rice. Charles Huang. Bulgur. Whole-grain and low-sodium cereals. Pita bread. Low-fat, low-sodium crackers. Whole-wheat flour tortillas. Vegetables Fresh or frozen vegetables (raw, steamed, roasted, or grilled). Low-sodium or reduced-sodium tomato and vegetable juice. Low-sodium or reduced-sodium tomato sauce and tomato paste. Low-sodium or reduced-sodium canned vegetables. Fruits All fresh, dried, or frozen  fruit. Canned fruit in natural juice (without added sugar). Meat and other protein foods Skinless chicken or [place]. Ground chicken or [place]. Pork with fat trimmed off. Fish and seafood. Egg whites. Dried beans, peas, or lentils. Unsalted nuts, nut butters, and seeds. Unsalted canned beans. Lean cuts of beef with fat trimmed off. Low-sodium, lean deli meat. Dairy Low-fat (1%) or fat-free (skim) milk. Fat-free, low-fat, or reduced-fat cheeses. Nonfat, low-sodium ricotta or cottage cheese. Low-fat or nonfat yogurt. Low-fat, low-sodium cheese. Fats and oils Soft margarine without trans fats. Vegetable oil. Low-fat, reduced-fat, or light mayonnaise and salad dressings (reduced-sodium). Canola, safflower, olive, soybean, and sunflower oils. Avocado. Seasoning and other foods Herbs. Spices. Seasoning mixes without salt. Unsalted popcorn and pretzels. Fat-free sweets. What foods are not recommended? The items listed may not be a complete list. Talk with your dietitian about what dietary choices are best for you. Grains Baked goods made with fat, such as croissants, muffins, or some breads. Dry pasta or rice meal packs. Vegetables Creamed or fried vegetables. Vegetables in a cheese sauce. Regular canned vegetables (not low-sodium or reduced-sodium). Regular canned tomato sauce and paste (not low-sodium or reduced-sodium). Regular tomato and vegetable juice (not low-sodium or reduced-sodium). Charles Huang. Olives. Fruits Canned fruit in a light or heavy syrup. Fried fruit. Fruit in cream or butter sauce. Meat and other protein foods Fatty cuts of meat. Ribs. Fried meat. Charles Huang. Sausage. Bologna and other processed lunch meats. Salami. Fatback. Hotdogs. Bratwurst. Salted nuts and seeds. Canned beans with added salt. Canned or smoked fish. Whole eggs or egg yolks. Chicken or [place] with skin. Dairy Whole or 2% milk, cream, and half-and-half. Whole or full-fat cream cheese. Whole-fat or sweetened yogurt. Full-fat  cheese. Nondairy creamers. Whipped toppings. Processed cheese and cheese spreads. Fats and oils Butter. Stick margarine. Lard. Shortening. Ghee. Bacon fat. Tropical oils, such as coconut, palm kernel, or palm oil. Seasoning and other foods Salted popcorn and pretzels. Onion salt, garlic salt, seasoned salt, table salt, and sea salt. Worcestershire sauce. Tartar sauce. Barbecue sauce. Teriyaki sauce. Soy sauce, including reduced-sodium. Steak sauce. Canned and packaged gravies. Fish sauce. Oyster sauce. Cocktail sauce. Horseradish that you find on the shelf. Ketchup. Mustard. Meat flavorings and tenderizers. Bouillon cubes. Hot sauce and Tabasco sauce. Premade or packaged marinades. Premade or packaged taco seasonings. Relishes. Regular salad dressings. Where to find more information:  National Heart, Lung, and [place]: https://wilson-eaton.com/  American Heart Association: www.heart.org Summary  The DASH eating plan is a healthy eating plan that has been shown to reduce high blood pressure (hypertension). It may also reduce your risk for type 2 diabetes, heart disease, and stroke.  With the DASH eating plan, you should limit salt (sodium) intake to 2,300 mg a day. If you have hypertension, you may need to reduce your sodium intake to 1,500 mg a day.  When on the DASH eating plan, aim to eat more fresh fruits and vegetables, whole grains, lean proteins, low-fat dairy, and heart-healthy fats.  Work with your health care provider or diet and  nutrition specialist (dietitian) to adjust your eating plan to your individual calorie needs. This information is not intended to replace advice given to you by your health care provider. Make sure you discuss any questions you have with your health care provider. Document Revised: 11/13/2017 Document Reviewed: 11/24/2016 Elsevier Patient Education  2020 Elsevier Inc.  Low-Sodium Eating Plan Sodium, which is an element that makes up salt, helps you maintain  a healthy balance of fluids in your body. Too much sodium can increase your blood pressure and cause fluid and waste to be held in your body. Your health care provider or dietitian may recommend following this plan if you have high blood pressure (hypertension), kidney disease, liver disease, or heart failure. Eating less sodium can help lower your blood pressure, reduce swelling, and protect your heart, liver, and kidneys. What are tips for following this plan? General guidelines  Most people on this plan should limit their sodium intake to 1,500-2,000 mg (milligrams) of sodium each day. Reading food labels   The Nutrition Facts label lists the amount of sodium in one serving of the food. If you eat more than one serving, you must multiply the listed amount of sodium by the number of servings.  Choose foods with less than 140 mg of sodium per serving.  Avoid foods with 300 mg of sodium or more per serving. Shopping  Look for lower-sodium products, often labeled as "low-sodium" or "no salt added."  Always check the sodium content even if foods are labeled as "unsalted" or "no salt added".  Buy fresh foods. ? Avoid canned foods and premade or frozen meals. ? Avoid canned, cured, or processed meats  Buy breads that have less than 80 mg of sodium per slice. Cooking  Eat more home-cooked food and less restaurant, buffet, and fast food.  Avoid adding salt when cooking. Use salt-free seasonings or herbs instead of table salt or sea salt. Check with your health care provider or pharmacist before using salt substitutes.  Cook with plant-based oils, such as canola, sunflower, or olive oil. Meal planning  When eating at a restaurant, ask that your food be prepared with less salt or no salt, if possible.  Avoid foods that contain MSG (monosodium glutamate). MSG is sometimes added to [place] food, bouillon, and some canned foods. What foods are recommended? The items listed may not be a  complete list. Talk with your dietitian about what dietary choices are best for you. Grains Low-sodium cereals, including oats, puffed wheat and rice, and shredded wheat. Low-sodium crackers. Unsalted rice. Unsalted pasta. Low-sodium bread. Whole-grain breads and whole-grain pasta. Vegetables Fresh or frozen vegetables. "No salt added" canned vegetables. "No salt added" tomato sauce and paste. Low-sodium or reduced-sodium tomato and vegetable juice. Fruits Fresh, frozen, or canned fruit. Fruit juice. Meats and other protein foods Fresh or frozen (no salt added) meat, poultry, seafood, and fish. Low-sodium canned tuna and salmon. Unsalted nuts. Dried peas, beans, and lentils without added salt. Unsalted canned beans. Eggs. Unsalted nut butters. Dairy Milk. Soy milk. Cheese that is naturally low in sodium, such as ricotta cheese, fresh mozzarella, or Swiss cheese Low-sodium or reduced-sodium cheese. Cream cheese. Yogurt. Fats and oils Unsalted butter. Unsalted margarine with no trans fat. Vegetable oils such as canola or olive oils. Seasonings and other foods Fresh and dried herbs and spices. Salt-free seasonings. Low-sodium mustard and ketchup. Sodium-free salad dressing. Sodium-free light mayonnaise. Fresh or refrigerated horseradish. Lemon juice. Vinegar. Homemade, reduced-sodium, or low-sodium soups. Unsalted popcorn and pretzels.  Low-salt or salt-free chips. What foods are not recommended? The items listed may not be a complete list. Talk with your dietitian about what dietary choices are best for you. Grains Instant hot cereals. Bread stuffing, pancake, and biscuit mixes. Croutons. Seasoned rice or pasta mixes. Noodle soup cups. Boxed or frozen macaroni and cheese. Regular salted crackers. Self-rising flour. Vegetables Sauerkraut, pickled vegetables, and relishes. Olives. [place] fries. Onion rings. Regular canned vegetables (not low-sodium or reduced-sodium). Regular canned tomato sauce and  paste (not low-sodium or reduced-sodium). Regular tomato and vegetable juice (not low-sodium or reduced-sodium). Frozen vegetables in sauces. Meats and other protein foods Meat or fish that is salted, canned, smoked, spiced, or pickled. Bacon, ham, sausage, hotdogs, corned beef, chipped beef, packaged lunch meats, salt pork, jerky, pickled herring, anchovies, regular canned tuna, sardines, salted nuts. Dairy Processed cheese and cheese spreads. Cheese curds. Blue cheese. Feta cheese. String cheese. Regular cottage cheese. Buttermilk. Canned milk. Fats and oils Salted butter. Regular margarine. Ghee. Bacon fat. Seasonings and other foods Onion salt, garlic salt, seasoned salt, table salt, and sea salt. Canned and packaged gravies. Worcestershire sauce. Tartar sauce. Barbecue sauce. Teriyaki sauce. Soy sauce, including reduced-sodium. Steak sauce. Fish sauce. Oyster sauce. Cocktail sauce. Horseradish that you find on the shelf. Regular ketchup and mustard. Meat flavorings and tenderizers. Bouillon cubes. Hot sauce and Tabasco sauce. Premade or packaged marinades. Premade or packaged taco seasonings. Relishes. Regular salad dressings. Salsa. Potato and tortilla chips. Corn chips and puffs. Salted popcorn and pretzels. Canned or dried soups. Pizza. Frozen entrees and pot pies. Summary  Eating less sodium can help lower your blood pressure, reduce swelling, and protect your heart, liver, and kidneys.  Most people on this plan should limit their sodium intake to 1,500-2,000 mg (milligrams) of sodium each day.  Canned, boxed, and frozen foods are high in sodium. Restaurant foods, fast foods, and pizza are also very high in sodium. You also get sodium by adding salt to food.  Try to cook at home, eat more fresh fruits and vegetables, and eat less fast food, canned, processed, or prepared foods. This information is not intended to replace advice given to you by your health care provider. Make sure you  discuss any questions you have with your health care provider. Document Revised: 11/13/2017 Document Reviewed: 11/24/2016 Elsevier Patient Education  2020 Elsevier Inc.  Sildenafil tablets (Erectile Dysfunction) What is this medicine? SILDENAFIL (sil DEN a fil) is used to treat erection problems in men. This medicine may be used for other purposes; ask your health care provider or pharmacist if you have questions. COMMON BRAND NAME(S): Viagra What should I tell my health care provider before I take this medicine? They need to know if you have any of these conditions:  bleeding disorders  eye or vision problems, including a rare inherited eye disease called retinitis pigmentosa  anatomical deformation of the penis, Peyronie's disease, or history of priapism (painful and prolonged erection)  heart disease, angina, a history of heart attack, irregular heart beats, or other heart problems  high or low blood pressure  history of blood diseases, like sickle cell anemia or leukemia  history of stomach bleeding  kidney disease  liver disease  stroke  an unusual or allergic reaction to sildenafil, other medicines, foods, dyes, or preservatives  pregnant or trying to get pregnant  breast-feeding How should I use this medicine? Take this medicine by mouth with a glass of water. Follow the directions on the prescription label. The dose is  usually taken 1 hour before sexual activity. You should not take the dose more than once per day. Do not take your medicine more often than directed. Talk to your pediatrician regarding the use of this medicine in children. This medicine is not used in children for this condition. Overdosage: If you think you have taken too much of this medicine contact a poison control center or emergency room at once. NOTE: This medicine is only for you. Do not share this medicine with others. What if I miss a dose? This does not apply. Do not take double or extra  doses. What may interact with this medicine? Do not take this medicine with any of the following medications:  cisapride  nitrates like amyl nitrite, isosorbide dinitrate, isosorbide mononitrate, nitroglycerin  riociguat This medicine may also interact with the following medications:  antiviral medicines for HIV or AIDS  bosentan  certain medicines for benign prostatic hyperplasia (BPH)  certain medicines for blood pressure  certain medicines for fungal infections like ketoconazole and itraconazole  cimetidine  erythromycin  rifampin This list may not describe all possible interactions. Give your health care provider a list of all the medicines, herbs, non-prescription drugs, or dietary supplements you use. Also tell them if you smoke, drink alcohol, or use illegal drugs. Some items may interact with your medicine. What should I watch for while using this medicine? If you notice any changes in your vision while taking this drug, call your doctor or health care professional as soon as possible. Stop using this medicine and call your health care provider right away if you have a loss of sight in one or both eyes. Contact your doctor or health care professional right away if you have an erection that lasts longer than 4 hours or if it becomes painful. This may be a sign of a serious problem and must be treated right away to prevent permanent damage. If you experience symptoms of nausea, dizziness, chest pain or arm pain upon initiation of sexual activity after taking this medicine, you should refrain from further activity and call your doctor or health care professional as soon as possible. Do not drink alcohol to excess (examples, 5 glasses of wine or 5 shots of whiskey) when taking this medicine. When taken in excess, alcohol can increase your chances of getting a headache or getting dizzy, increasing your heart rate or lowering your blood pressure. Using this medicine does not protect  you or your partner against HIV infection (the virus that causes AIDS) or other sexually transmitted diseases. What side effects may I notice from receiving this medicine? Side effects that you should report to your doctor or health care professional as soon as possible:  allergic reactions like skin rash, itching or hives, swelling of the face, lips, or tongue  breathing problems  changes in hearing  changes in vision  chest pain  fast, irregular heartbeat  prolonged or painful erection  seizures Side effects that usually do not require medical attention (report to your doctor or health care professional if they continue or are bothersome):  back pain  dizziness  flushing  headache  indigestion  muscle aches  nausea  stuffy or runny nose This list may not describe all possible side effects. Call your doctor for medical advice about side effects. You may report side effects to FDA at 1-800-FDA-1088. Where should I keep my medicine? Keep out of reach of children. Store at room temperature between 15 and 30 degrees C (59 and 86  degrees F). Throw away any unused medicine after the expiration date. NOTE: This sheet is a summary. It may not cover all possible information. If you have questions about this medicine, talk to your doctor, pharmacist, or health care provider.  2020 Elsevier/Gold Standard (2015-11-14 12:00:25)  Amlodipine Oral Tablets What is this medicine? AMLODIPINE (am LOE di peen) is a calcium channel blocker. It relaxes your blood vessels and decreases the amount of work the heart has to do. It treats high blood pressure and/or prevents chest pain (also called angina). This medicine may be used for other purposes; ask your health care provider or pharmacist if you have questions. COMMON BRAND NAME(S): Norvasc What should I tell my health care provider before I take this medicine? They need to know if you have any of these conditions:  heart  disease  liver disease  an unusual or allergic reaction to amlodipine, other drugs, foods, dyes, or preservatives  pregnant or trying to get pregnant  breast-feeding How should I use this medicine? Take this drug by mouth. Take it as directed on the prescription label at the same time every day. You can take it with or without food. If it upsets your stomach, take it with food. Keep taking it unless your health care provider tells you to stop. Talk to your health care provider about the use of this drug in children. While it may be prescribed for children as young as 6 for selected conditions, precautions do apply. Overdosage: If you think you have taken too much of this medicine contact a poison control center or emergency room at once. NOTE: This medicine is only for you. Do not share this medicine with others. What if I miss a dose? If you miss a dose, take it as soon as you can. If it is almost time for your next dose, take only that dose. Do not take double or extra doses. What may interact with this medicine? This medicine may interact with the following medications:  clarithromycin  cyclosporine  diltiazem  itraconazole  simvastatin  tacrolimus This list may not describe all possible interactions. Give your health care provider a list of all the medicines, herbs, non-prescription drugs, or dietary supplements you use. Also tell them if you smoke, drink alcohol, or use illegal drugs. Some items may interact with your medicine. What should I watch for while using this medicine? Visit your health care provider for regular checks on your progress. Check your blood pressure as directed. Ask your health care provider what your blood pressure should be. Also, find out when you should contact him or her. Do not treat yourself for coughs, colds, or pain while you are using this drug without asking your health care provider for advice. Some drugs may increase your blood pressure. You  may get drowsy or dizzy. Do not drive, use machinery, or do anything that needs mental alertness until you know how this drug affects you. Do not stand up or sit up quickly, especially if you are an older patient. This reduces the risk of dizzy or fainting spells. What side effects may I notice from receiving this medicine? Side effects that you should report to your doctor or health care provider as soon as possible:  allergic reactions (skin rash, itching or hives; swelling of the face, lips, or tongue)  heart attack (trouble breathing; pain or tightness in the chest, neck, back or arms; unusually weak or tired)  low blood pressure (dizziness; feeling faint or lightheaded, falls; unusually  weak or tired) Side effects that usually do not require medical attention (report these to your doctor or health care provider if they continue or are bothersome):  facial flushing  nausea  palpitations  stomach pain  sudden weight gain  swelling of the ankles, feet, hands This list may not describe all possible side effects. Call your doctor for medical advice about side effects. You may report side effects to FDA at 1-800-FDA-1088. Where should I keep my medicine? Keep out of the reach of children and pets. Store at room temperature between 59 and 86 degrees F (15 and 30 degrees C). Protect from light and moisture. Keep the container tightly closed. Throw away any unused drug after the expiration date. NOTE: This sheet is a summary. It may not cover all possible information. If you have questions about this medicine, talk to your doctor, pharmacist, or health care provider.  2020 Elsevier/Gold Standard (2019-09-06 19:39:45)

## 2020-02-01 NOTE — Progress Notes (Signed)
Chief Complaint  Patient presents with  . Annual Exam   Annual  1. hTN BP elevated on 140/90, 162/100 hyzaar 100-25 mg qam mild h/a today no dizziness   2. Alcohol abuse drinking 1 pt vodka qd x 3 months and w/in the last year ended 5 year relationship with fiance which has bothered his mood but doing better now has another girlfriend  He is trying to cut back and keep busy getting a second job  PHQ 9 score 1   3. Of note son had covid 10/2019 was testing but negative   4. C/o erectile dysfunction and wants viagra   5. Chronic knee and low back pain looking into [facility] in [facility] not able to find ortho help in [place]   6. Hematuria noted previously if + again will refer Dr. Vicenta D[facility]    Review of Systems  Constitutional: Negative for weight loss.  HENT: Negative for hearing loss.   Eyes: Negative for blurred vision.  Respiratory: Negative for shortness of breath.   Cardiovascular: Negative for chest pain.  Gastrointestinal: Negative for abdominal pain and blood in stool.  Musculoskeletal: Positive for back pain and joint pain.  Skin: Negative for rash.  Neurological: Positive for headaches.  Psychiatric/Behavioral: Negative for depression.   Past Medical History:  Diagnosis Date  . Arthritis    hands, neck, back, knees   . Atrial fibrillation ([place])   . Hyperlipidemia   . Hypertension   . Prediabetes    Past Surgical History:  Procedure Laterality Date  . NO PAST SURGERIES     Family History  Problem Relation Age of Onset  . Arthritis Mother   . Hyperlipidemia Mother   . Stroke Mother   . Mental illness Neg Hx    Social History   Socioeconomic History  . Marital status: Divorced    Spouse name: Not on file  . Number of children: 4  . Years of education: Not on file  . Highest education level: Associate degree: academic program  Occupational History  . Not on file  Tobacco Use  . Smoking status: Former Smoker    Types:  Cigarettes    Quit date: 05/29/2016    Years since quitting: 3.6  . Smokeless tobacco: Never Used  Substance and Sexual Activity  . Alcohol use: Not Currently    Comment: socially  . Drug use: No  . Sexual activity: Yes    Birth control/protection: Condom  Other Topics Concern  . 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 Difficulty of Paying Living Expenses: Not hard at all  Food Insecurity: No Food Insecurity  . Worried About Charity fundraiser in the Last Year: Never true  . Ran Out of Food in the Last Year: Never true  Transportation Needs: No Transportation Needs  . Lack of Transportation (Medical): No  . Lack of Transportation (Non-Medical): No  Physical Activity: Insufficiently Active  . Days of Exercise per Week: 3 days  . Minutes of Exercise per Session: 30 min  Stress: No Stress Concern Present  . Feeling of Stress : Not at all  Social Connections: Unknown  . Frequency of Communication with Friends and Family: Not on file  . Frequency of Social Gatherings with Friends and Family: Not on file  . Attends Religious Services:  Never  . Active Member of Clubs or Organizations: No  . Attends Club or Organization Meetings: Never  . Marital Status: Divorced  Human resources officer Violence: Not At Risk  . Fear of Current or Ex-Partner: No  . Emotionally Abused: No  . Physically Abused: No  . Sexually Abused: No   Current Meds  Medication Sig  . diclofenac (VOLTAREN) 75 MG EC tablet Take 1 tablet (75 mg total) by mouth 2 (two) times daily after a meal. As needed  . diclofenac sodium (VOLTAREN) 1 % GEL Apply 2-4 g topically 4 (four) times daily. 2 grams upper body and 4 grams lower body qid  . losartan-hydrochlorothiazide (HYZAAR) 100-25 MG tablet Take 1 tablet by mouth daily. In am  . [DISCONTINUED] cyclobenzaprine (FLEXERIL) 10 MG tablet Take 1  tablet (10 mg total) by mouth at bedtime as needed for muscle spasms.   No Known Allergies Recent Results (from the past 2160 hour(s))  Novel Coronavirus, NAA ([facility])     Status: None   Collection Time: 11/24/19  9:53 AM   Specimen: Nasopharyngeal(NP) swabs in vial transport medium   NASOPHARYNGE  TESTING  Result Value Ref Range   SARS-CoV-2, NAA Not Detected Not Detected    Comment: This nucleic acid amplification test was developed and its performance characteristics determined by [employer]. Nucleic acid amplification tests include PCR and TMA. This test has not been FDA cleared or approved. This test has been authorized by FDA under an Emergency Use Authorization (EUA). This test is only authorized for the duration of time the declaration that circumstances exist justifying the authorization of the emergency use of in vitro diagnostic tests for detection of SARS-CoV-2 virus and/or diagnosis of COVID-19 infection under section 564(b)(1) of the Act, 21 U.S.C. 448JEH-6(D) (1), unless the authorization is terminated or revoked sooner. When diagnostic testing is negative, the possibility of a false negative result should be considered in the context of a patient's recent exposures and the presence of clinical signs and symptoms consistent with COVID-19. An individual without symptoms of COVID-19 and who is not shedding SARS-CoV-2 virus would  expect to have a negative (not detected) result in this assay.    Objective  Body mass index is 41.8 kg/m. Wt Readings from Last 3 Encounters:  02/01/20 (!) 343 lb 6.4 oz (155.8 kg)  10/18/19 (!) 325 lb (147.4 kg)  06/02/19 (!) 348 lb (157.9 kg)   Temp Readings from Last 3 Encounters:  02/01/20 98.3 F (36.8 C) (Temporal)  10/18/19 98.2 F (36.8 C)  03/08/19 98.2 F (36.8 C) (Oral)   BP Readings from Last 3 Encounters:  02/01/20 140/90  10/18/19 (!) 148/103  03/08/19 (!) 149/101   Pulse Readings from Last 3  Encounters:  02/01/20 81  10/18/19 (!) 104  03/08/19 78    Physical Exam Vitals and nursing note reviewed.  Constitutional:      Appearance: Normal appearance. He is well-developed and well-groomed. He is obese.  HENT:     Head: Normocephalic and atraumatic.     Ears:     Comments: Left ear wax increased   Eyes:     Conjunctiva/sclera: Conjunctivae normal.     Pupils: Pupils are equal, round, and reactive to light.  Cardiovascular:     Rate and Rhythm: Normal rate and regular rhythm.     Heart sounds: Normal heart sounds. No murmur.  Pulmonary:     Effort: Pulmonary effort is normal.     Breath sounds: Normal breath sounds.  Abdominal:  General: Abdomen is flat. Bowel sounds are normal.     Tenderness: There is no abdominal tenderness.  Musculoskeletal:     Right lower leg: No edema.     Left lower leg: No edema.  Skin:    General: Skin is warm and dry.  Neurological:     General: No focal deficit present.     Mental Status: He is alert and oriented to person, place, and time. Mental status is at baseline.     Comments: Slowed gait 2/2 joint pain    Psychiatric:        Attention and Perception: Attention and perception normal.        Mood and Affect: Mood and affect normal.        Speech: Speech normal.        Behavior: Behavior normal. Behavior is cooperative.        Thought Content: Thought content normal.        Cognition and Memory: Cognition and memory normal.        Judgment: Judgment normal.     Assessment  Plan  Annual physical exam Declines flu shot  Tdap utd Will given hep B1/3doses -consider new hep B vaccine x 2 doses in future  pna 23 vaccineutd MMR immune  covid vaccine considering   psa check 45  Colonoscopy screen start 45  rec etoh cessation  rec healthy diet and exercise  Former smoker  Essential hypertension - Plan: amLODipine (NORVASC) 5 MG tablet, Comprehensive metabolic panel, Lipid panel, CBC w/Diff Add norvasc 5 mg qd   Cont hyzaar 100/25  Low salt diet info givne  Erectile dysfunction, unspecified erectile dysfunction type - Plan: sildenafil (REVATIO) 20-60MG tablet daily prn, Testosterone If needed will send to [facility]  Hematuria, unspecified type - Plan: Urinalysis, Routine w reflex microscopic, Urine Culture Consider [facility]rology referral [facility] in [place] see HPI   Prediabetes - Plan: Hemoglobin A1c  Hyperlipidemia, unspecified hyperlipidemia type - Plan: Lipid panel  Morbid obesity with BMI of 40.0-44.9, adult ([place])  Healthy diet and exercise   Provider: Dr. Olivia Mackie McLean-[facility]oc[facility]

## 2020-02-02 DIAGNOSIS — R319 Hematuria, unspecified: Secondary | ICD-10-CM

## 2020-02-02 DIAGNOSIS — Z0000 Encounter for general adult medical examination without abnormal findings: Secondary | ICD-10-CM | POA: Insufficient documentation

## 2020-02-02 DIAGNOSIS — N529 Male erectile dysfunction, unspecified: Secondary | ICD-10-CM

## 2020-02-02 HISTORY — DX: Male erectile dysfunction, unspecified: N52.9

## 2020-02-02 HISTORY — DX: Hematuria, unspecified: R31.9

## 2020-02-02 HISTORY — DX: Encounter for general adult medical examination without abnormal findings: Z00.00

## 2020-02-07 ENCOUNTER — Other Ambulatory Visit (INDEPENDENT_AMBULATORY_CARE_PROVIDER_SITE_OTHER): Payer: No Typology Code available for payment source

## 2020-02-07 ENCOUNTER — Ambulatory Visit: Payer: No Typology Code available for payment source

## 2020-02-07 ENCOUNTER — Other Ambulatory Visit: Payer: Self-pay

## 2020-02-07 ENCOUNTER — Telehealth: Payer: Self-pay | Admitting: Internal Medicine

## 2020-02-07 DIAGNOSIS — E291 Testicular hypofunction: Secondary | ICD-10-CM

## 2020-02-07 DIAGNOSIS — E559 Vitamin D deficiency, unspecified: Secondary | ICD-10-CM | POA: Diagnosis not present

## 2020-02-07 DIAGNOSIS — N529 Male erectile dysfunction, unspecified: Secondary | ICD-10-CM | POA: Diagnosis not present

## 2020-02-07 DIAGNOSIS — I10 Essential (primary) hypertension: Secondary | ICD-10-CM

## 2020-02-07 DIAGNOSIS — R319 Hematuria, unspecified: Secondary | ICD-10-CM | POA: Diagnosis not present

## 2020-02-07 DIAGNOSIS — R7303 Prediabetes: Secondary | ICD-10-CM | POA: Diagnosis not present

## 2020-02-07 DIAGNOSIS — E785 Hyperlipidemia, unspecified: Secondary | ICD-10-CM | POA: Diagnosis not present

## 2020-02-07 LAB — LIPID PANEL
Cholesterol: 220 mg/dL — ABNORMAL HIGH (ref 0–200)
HDL: 51.1 mg/dL (ref >39.00)
LDL Cholesterol: 140 mg/dL — ABNORMAL HIGH (ref 0–99)
NonHDL: 168.83
Total CHOL/HDL Ratio: 4
Triglycerides: 145 mg/dL (ref 0.0–149.0)
VLDL: 29 mg/dL (ref 0.0–40.0)

## 2020-02-07 LAB — COMPREHENSIVE METABOLIC PANEL
ALT: 35 U/L (ref 0–53)
AST: 28 U/L (ref 0–37)
Albumin: 4.5 g/dL (ref 3.5–5.2)
Alkaline Phosphatase: 54 U/L (ref 39–117)
BUN: 13 mg/dL (ref 6–23)
CO2: 32 mEq/L (ref 19–32)
Calcium: 9.9 mg/dL (ref 8.4–10.5)
Chloride: 101 mEq/L (ref 96–112)
Creatinine, Ser: 1.05 mg/dL (ref 0.40–1.50)
GFR: 93.05 mL/min (ref >60.00)
Glucose, Bld: 88 mg/dL (ref 70–99)
Potassium: 3.8 mEq/L (ref 3.5–5.1)
Sodium: 138 mEq/L (ref 135–145)
Total Bilirubin: 0.6 mg/dL (ref 0.2–1.2)
Total Protein: 7.5 g/dL (ref 6.0–8.3)

## 2020-02-07 LAB — CBC WITH DIFFERENTIAL/PLATELET
Basophils Absolute: 0 10*3/uL (ref 0.0–0.1)
Basophils Relative: 0.6 % (ref 0.0–3.0)
Eosinophils Absolute: 0.1 10*3/uL (ref 0.0–0.7)
Eosinophils Relative: 1.2 % (ref 0.0–5.0)
HCT: 42.4 % (ref 39.0–52.0)
Hemoglobin: 14 g/dL (ref 13.0–17.0)
Lymphocytes Relative: 32.8 % (ref 12.0–46.0)
Lymphs Abs: 2.2 10*3/uL (ref 0.7–4.0)
MCHC: 32.9 g/dL (ref 30.0–36.0)
MCV: 86.6 fl (ref 78.0–100.0)
Monocytes Absolute: 0.5 10*3/uL (ref 0.1–1.0)
Monocytes Relative: 7.6 % (ref 3.0–12.0)
Neutro Abs: 3.9 10*3/uL (ref 1.4–7.7)
Neutrophils Relative %: 57.8 % (ref 43.0–77.0)
Platelets: 264 10*3/uL (ref 150.0–400.0)
RBC: 4.9 Mil/uL (ref 4.22–5.81)
RDW: 15.1 % (ref 11.5–15.5)
WBC: 6.8 10*3/uL (ref 4.0–10.5)

## 2020-02-07 LAB — VITAMIN D 25 HYDROXY (VIT D DEFICIENCY, FRACTURES): VITD: 17.33 ng/mL — ABNORMAL LOW (ref 30.00–100.00)

## 2020-02-07 LAB — TESTOSTERONE: Testosterone: 395.06 ng/dL (ref 300.00–890.00)

## 2020-02-07 NOTE — Progress Notes (Addendum)
Patient is here for a BP check due to bp being high at last visit, as per patient.  Currently patients BP is 144/92 and BPM is 84.  Patient has no complaints of headaches, blurry vision, chest pain, arm pain, light headedness, dizziness, and nor jaw pain  Call patient make sure he had norvasc 5 mg daily and hyzaar 100-25 mg in am 2 hours before BP check  Can he check his BP for the next 1-2 weeks at home and my chart with readings goal <130/<80  If not at goal will increase norvasc to 10 mg daily  Let me know in 1-2 weeks BP readings 2 hours after meds  TMS

## 2020-02-07 NOTE — Telephone Encounter (Signed)
Patient is here for a BP check due to bp being high at last visit, as per patient.  Currently patients BP is 144/92 and BPM is 84.  Patient has no complaints of headaches, blurry vision, chest pain, arm pain, light headedness, dizziness, and nor jaw pain   Call patient:    Call patient make sure he had norvasc 5 mg daily and hyzaar 100-25 mg in am 2 hours before BP check  Can he check his BP for the next 1-2 weeks at home and my chart with readings goal <130/<80  If not at goal will increase norvasc to 10 mg daily  Let me know in 1-2 weeks BP readings 2 hours after meds  TMS

## 2020-02-08 ENCOUNTER — Telehealth: Payer: Self-pay | Admitting: Internal Medicine

## 2020-02-08 LAB — URINE CULTURE
MICRO NUMBER:: 10178146
Result:: NO GROWTH
SPECIMEN QUALITY:: ADEQUATE

## 2020-02-08 LAB — URINALYSIS, ROUTINE W REFLEX MICROSCOPIC
Bilirubin Urine: NEGATIVE
Glucose, UA: NEGATIVE
Hgb urine dipstick: NEGATIVE
Leukocytes,Ua: NEGATIVE
Nitrite: NEGATIVE
Protein, ur: NEGATIVE
Specific Gravity, Urine: 1.021 (ref 1.001–1.03)
pH: 6 (ref 5.0–8.0)

## 2020-02-08 NOTE — Telephone Encounter (Signed)
Patient informed and verbalized understanding.  He will mychart in what his bp readings have been after 2 weeks. Patient will call in if he is having any of the listed symptoms.

## 2020-02-08 NOTE — Telephone Encounter (Signed)
Prior authorization has been submitted for patient's Sildenafil  ? ?Awaiting approval or denial.  ? ?

## 2020-02-09 ENCOUNTER — Other Ambulatory Visit: Payer: Self-pay | Admitting: Internal Medicine

## 2020-02-09 DIAGNOSIS — E559 Vitamin D deficiency, unspecified: Secondary | ICD-10-CM

## 2020-02-09 LAB — HEMOGLOBIN A1C: Hgb A1c MFr Bld: 5.7 % (ref 4.6–6.5)

## 2020-02-09 MED ORDER — CHOLECALCIFEROL 1.25 MG (50000 UT) PO CAPS: 50000.0000 [IU] | ORAL_CAPSULE | ORAL | 1 refills | Status: DC

## 2020-02-14 ENCOUNTER — Telehealth: Payer: Self-pay | Admitting: Internal Medicine

## 2020-02-14 NOTE — Telephone Encounter (Signed)
Patient informed and verbalized understanding.  Handout mailed and patient has picked up vitamin d script.

## 2020-02-14 NOTE — Telephone Encounter (Signed)
-----   Message from Bevelyn Buckles, MD sent at 02/09/2020  3:40 PM EST ----- A1C 5.7 improved prediabetes  Cholesterol worse  -mail cholesterol handout  -rec exercise   Liver enzymes improved  Vitamin D low  -sent to pharmacy 1x per week   Testosterone normal  Blood cts normal  Urine cloudy increase water intake

## 2020-02-14 NOTE — Telephone Encounter (Signed)
Bronwen Betters, [facility]  02/10/2020 8:55 AM EST    Left message for patient to return call back for lab results.    Left message to return call again today 02/14/2020.

## 2020-02-14 NOTE — Telephone Encounter (Signed)
Pt called returning your call 

## 2020-02-15 NOTE — Telephone Encounter (Signed)
Prior authorization has been denied.   Why was my request denied? This request was denied because you did not meet the following clinical requirements: The requested medication is not a covered benefit. The U.S. Food and Drug Administration (FDA) has not approved this medication for use in your condition, and the clinical information submitted by your doctor does not meet the criteria established for off-label drug use, in accordance with the terms and conditions of your plan benefit. Per your health plan's criteria, this drug is covered if you meet the following: The plan allows [place] to cover a drug only when it is used for a "medically accepted indication." A medically accepted indication means the use is approved by the Food and Drug Administration (FDA) or one of the following medical resources: (1) Two articles from major peer-reviewed medical journals. (2) The [facility] Formulary Service Drug Information. (3) DRUGDEX System by [employer]. Sildenafil citrate 20mg  is not FDA approved or supported by an accepted medical resource for the treatment of your medical condition(s): Erectile dysfunction. The information provided does not show that you meet the criteria listed above. Please speak with your doctor about your choices. Reviewed by: [facility], Pharm.D. This denial is based on our Sildenafil Citrate drug coverage policy, in addition to any supplementary information you or your prescriber may have submitted

## 2020-02-20 ENCOUNTER — Other Ambulatory Visit: Payer: Self-pay | Admitting: Internal Medicine

## 2020-02-20 DIAGNOSIS — N529 Male erectile dysfunction, unspecified: Secondary | ICD-10-CM

## 2020-02-20 MED ORDER — SILDENAFIL CITRATE 20 MG PO TABS: 20.0000 mg | ORAL_TABLET | Freq: Every day | ORAL | 5 refills | Status: DC | PRN

## 2020-02-20 NOTE — Telephone Encounter (Signed)
Left message to return call 

## 2020-02-20 NOTE — Telephone Encounter (Signed)
A lot of insurance co do not cover this medication  He can try [facility] in [place] as  an option sent Rx there  Inform them and given phone # to call they can deliver med to [place] if interested  Otherwise he will need urology referral   tMS

## 2020-02-20 NOTE — Telephone Encounter (Signed)
Please advise 

## 2020-02-22 NOTE — Telephone Encounter (Addendum)
Left message to return call. ° °Mychart message sent  °

## 2020-03-06 ENCOUNTER — Encounter: Payer: Self-pay | Admitting: Internal Medicine

## 2020-03-06 DIAGNOSIS — G8929 Other chronic pain: Secondary | ICD-10-CM

## 2020-03-06 DIAGNOSIS — M25562 Pain in left knee: Secondary | ICD-10-CM

## 2020-03-06 MED ORDER — DICLOFENAC SODIUM 1 % EX GEL: CUTANEOUS | 5 refills | Status: DC

## 2020-03-06 MED ORDER — DICLOFENAC SODIUM 75 MG PO TBEC: 75.0000 mg | DELAYED_RELEASE_TABLET | Freq: Two times a day (BID) | ORAL | 5 refills | Status: DC

## 2020-05-11 ENCOUNTER — Encounter: Payer: Self-pay | Admitting: Internal Medicine

## 2020-05-11 DIAGNOSIS — I10 Essential (primary) hypertension: Secondary | ICD-10-CM

## 2020-05-11 DIAGNOSIS — M25562 Pain in left knee: Secondary | ICD-10-CM

## 2020-05-11 DIAGNOSIS — E559 Vitamin D deficiency, unspecified: Secondary | ICD-10-CM

## 2020-05-11 DIAGNOSIS — G8929 Other chronic pain: Secondary | ICD-10-CM

## 2020-05-11 MED ORDER — DICLOFENAC SODIUM 75 MG PO TBEC: 75.0000 mg | DELAYED_RELEASE_TABLET | Freq: Two times a day (BID) | ORAL | 2 refills | Status: DC

## 2020-05-11 MED ORDER — DICLOFENAC SODIUM 1 % EX GEL: CUTANEOUS | 2 refills | Status: DC

## 2020-05-11 MED ORDER — LOSARTAN POTASSIUM-HCTZ 100-25 MG PO TABS: 1.0000 | ORAL_TABLET | Freq: Every day | ORAL | 1 refills | Status: DC

## 2020-05-11 MED ORDER — CHOLECALCIFEROL 1.25 MG (50000 UT) PO CAPS: 50000.0000 [IU] | ORAL_CAPSULE | ORAL | 1 refills | Status: DC

## 2020-05-11 MED ORDER — AMLODIPINE BESYLATE 5 MG PO TABS: 5.0000 mg | ORAL_TABLET | Freq: Every day | ORAL | 1 refills | Status: DC

## 2020-05-18 ENCOUNTER — Other Ambulatory Visit: Payer: Self-pay | Admitting: Internal Medicine

## 2020-05-18 ENCOUNTER — Encounter: Payer: Self-pay | Admitting: Internal Medicine

## 2020-05-18 DIAGNOSIS — M5416 Radiculopathy, lumbar region: Secondary | ICD-10-CM

## 2020-05-18 DIAGNOSIS — G8929 Other chronic pain: Secondary | ICD-10-CM

## 2020-05-18 MED ORDER — CYCLOBENZAPRINE HCL 10 MG PO TABS: 10.0000 mg | ORAL_TABLET | Freq: Every evening | ORAL | 5 refills | Status: DC | PRN

## 2020-07-11 ENCOUNTER — Telehealth: Payer: Self-pay | Admitting: Internal Medicine

## 2020-07-11 NOTE — Telephone Encounter (Signed)
Faxed prescriber response form to [facility]

## 2020-07-27 ENCOUNTER — Encounter: Payer: Self-pay | Admitting: Internal Medicine

## 2020-07-30 ENCOUNTER — Other Ambulatory Visit: Payer: Self-pay | Admitting: Internal Medicine

## 2020-07-30 DIAGNOSIS — G8929 Other chronic pain: Secondary | ICD-10-CM

## 2020-07-30 MED ORDER — DICLOFENAC SODIUM 75 MG PO TBEC: 75.0000 mg | DELAYED_RELEASE_TABLET | Freq: Two times a day (BID) | ORAL | 2 refills | Status: DC

## 2020-07-30 NOTE — Telephone Encounter (Signed)
Medication: diclofenac (VOLTAREN) 75 MG EC tablet

## 2020-08-15 ENCOUNTER — Ambulatory Visit: Payer: No Typology Code available for payment source | Admitting: Physician Assistant

## 2020-11-13 ENCOUNTER — Encounter: Payer: Self-pay | Admitting: Internal Medicine

## 2020-11-13 ENCOUNTER — Ambulatory Visit (INDEPENDENT_AMBULATORY_CARE_PROVIDER_SITE_OTHER): Payer: Managed Care, Other (non HMO) | Admitting: Internal Medicine

## 2020-11-13 ENCOUNTER — Other Ambulatory Visit: Payer: Self-pay

## 2020-11-13 VITALS — BP 126/90 | HR 105 | Temp 97.9°F | Ht 76.0 in | Wt 315.0 lb

## 2020-11-13 DIAGNOSIS — Z1329 Encounter for screening for other suspected endocrine disorder: Secondary | ICD-10-CM

## 2020-11-13 DIAGNOSIS — I10 Essential (primary) hypertension: Secondary | ICD-10-CM | POA: Diagnosis not present

## 2020-11-13 DIAGNOSIS — M5416 Radiculopathy, lumbar region: Secondary | ICD-10-CM

## 2020-11-13 DIAGNOSIS — N529 Male erectile dysfunction, unspecified: Secondary | ICD-10-CM

## 2020-11-13 DIAGNOSIS — M5440 Lumbago with sciatica, unspecified side: Secondary | ICD-10-CM

## 2020-11-13 DIAGNOSIS — R7303 Prediabetes: Secondary | ICD-10-CM | POA: Diagnosis not present

## 2020-11-13 DIAGNOSIS — E559 Vitamin D deficiency, unspecified: Secondary | ICD-10-CM

## 2020-11-13 DIAGNOSIS — Z1389 Encounter for screening for other disorder: Secondary | ICD-10-CM

## 2020-11-13 DIAGNOSIS — M25561 Pain in right knee: Secondary | ICD-10-CM | POA: Diagnosis not present

## 2020-11-13 DIAGNOSIS — E669 Obesity, unspecified: Secondary | ICD-10-CM | POA: Insufficient documentation

## 2020-11-13 DIAGNOSIS — G8929 Other chronic pain: Secondary | ICD-10-CM

## 2020-11-13 DIAGNOSIS — M25562 Pain in left knee: Secondary | ICD-10-CM

## 2020-11-13 DIAGNOSIS — Z13818 Encounter for screening for other digestive system disorders: Secondary | ICD-10-CM

## 2020-11-13 HISTORY — DX: Obesity, unspecified: E66.9

## 2020-11-13 MED ORDER — CYCLOBENZAPRINE HCL 10 MG PO TABS: 10.0000 mg | ORAL_TABLET | Freq: Every evening | ORAL | 5 refills | Status: DC | PRN

## 2020-11-13 MED ORDER — DICLOFENAC SODIUM 75 MG PO TBEC: 75.0000 mg | DELAYED_RELEASE_TABLET | Freq: Two times a day (BID) | ORAL | 5 refills | Status: DC

## 2020-11-13 MED ORDER — CHOLECALCIFEROL 1.25 MG (50000 UT) PO CAPS: 50000.0000 [IU] | ORAL_CAPSULE | ORAL | 1 refills | Status: DC

## 2020-11-13 MED ORDER — SILDENAFIL CITRATE 20 MG PO TABS: 20.0000 mg | ORAL_TABLET | Freq: Every day | ORAL | 11 refills | Status: DC | PRN

## 2020-11-13 MED ORDER — AMLODIPINE BESYLATE 5 MG PO TABS: 5.0000 mg | ORAL_TABLET | Freq: Every day | ORAL | 3 refills | Status: DC

## 2020-11-13 MED ORDER — LOSARTAN POTASSIUM-HCTZ 100-25 MG PO TABS: 1.0000 | ORAL_TABLET | Freq: Every day | ORAL | 3 refills | Status: DC

## 2020-11-13 NOTE — Patient Instructions (Addendum)
Dr. Leodis Rains plastic surgeon  Dr. Evelena Peat [place] plastic surgeon    Goal blood pressure <130/<80   Consider [facility] for colonoscopy in 07/2021 or after   Will refer to [facility]      Colonoscopy, Adult A colonoscopy is a procedure to look at the entire large intestine. This procedure is done using a long, thin, flexible tube that has a camera on the end. You may have a colonoscopy:  As a part of normal colorectal screening.  If you have certain symptoms, such as: ? A low number of red blood cells in your blood (anemia). ? Diarrhea that does not go away. ? Pain in your abdomen. ? Blood in your stool. A colonoscopy can help screen for and diagnose medical problems, including:  Tumors.  Extra tissue that grows where mucus forms (polyps).  Inflammation.  Areas of bleeding. Tell your health care provider about:  Any allergies you have.  All medicines you are taking, including vitamins, herbs, eye drops, creams, and over-the-counter medicines.  Any problems you or family members have had with anesthetic medicines.  Any blood disorders you have.  Any surgeries you have had.  Any medical conditions you have.  Any problems you have had with having bowel movements.  Whether you are pregnant or may be pregnant. What are the risks? Generally, this is a safe procedure. However, problems may occur, including:  Bleeding.  Damage to your intestine.  Allergic reactions to medicines given during the procedure.  Infection. This is rare. What happens before the procedure? Eating and drinking restrictions Follow instructions from your health care provider about eating or drinking restrictions, which may include:  A few days before the procedure: ? Follow a low-fiber diet. ? Avoid nuts, seeds, dried fruit, raw fruits, and vegetables.  1-3 days before the procedure: ? Eat only gelatin dessert or ice pops. ? Drink only clear liquids, such as water, clear juice,  clear broth or bouillon, black coffee or tea, or clear soft drinks or sports drinks. ? Avoid liquids that contain red or purple dye.  The day of the procedure: ? Do not eat solid foods. You may continue to drink clear liquids until up to 2 hours before the procedure. ? Do not eat or drink anything starting 2 hours before the procedure, or within the time period that your health care provider recommends. Bowel prep If you were prescribed a bowel prep to take by mouth (orally) to clean out your colon:  Take it as told by your health care provider. Starting the day before your procedure, you will need to drink a large amount of liquid medicine. The liquid will cause you to have many bowel movements of loose stool until your stool becomes almost clear or light green.  If your skin or the opening between the buttocks (anus) gets irritated from diarrhea, you may relieve the irritation using: ? Wipes with medicine in them, such as adult wet wipes with aloe and vitamin E. ? A product to soothe skin, such as petroleum jelly.  If you vomit while drinking the bowel prep: ? Take a break for up to 60 minutes. ? Begin the bowel prep again. ? Call your health care provider if you keep vomiting or you cannot take the bowel prep without vomiting.  To clean out your colon, you may also be given: ? Laxative medicines. These help you have a bowel movement. ? Instructions for enema use. An enema is liquid medicine injected into your  rectum. Medicines Ask your health care provider about:  Changing or stopping your regular medicines or supplements. This is especially important if you are taking iron supplements, diabetes medicines, or blood thinners.  Taking medicines such as aspirin and ibuprofen. These medicines can thin your blood. Do not take these medicines unless your health care provider tells you to take them.  Taking over-the-counter medicines, vitamins, herbs, and supplements. General  instructions  Ask your health care provider what steps will be taken to help prevent infection. These may include washing skin with a germ-killing soap.  Plan to have someone take you home from the hospital or clinic. What happens during the procedure?   An IV will be inserted into one of your veins.  You may be given one or more of the following: ? A medicine to help you relax (sedative). ? A medicine to numb the area (local anesthetic). ? A medicine to make you fall asleep (general anesthetic). This is rarely needed.  You will lie on your side with your knees bent.  The tube will: ? Have oil or gel put on it (be lubricated). ? Be inserted into your anus. ? Be gently eased through all parts of your large intestine.  Air will be sent into your colon to keep it open. This may cause some pressure or cramping.  Images will be taken with the camera and will appear on a screen.  A small tissue sample may be removed to be looked at under a microscope (biopsy). The tissue may be sent to a lab for testing if any signs of problems are found.  If small polyps are found, they may be removed and checked for cancer cells.  When the procedure is finished, the tube will be removed. The procedure may vary among health care providers and hospitals. What happens after the procedure?  Your blood pressure, heart rate, breathing rate, and blood oxygen level will be monitored until you leave the hospital or clinic.  You may have a small amount of blood in your stool.  You may pass gas and have mild cramping or bloating in your abdomen. This is caused by the air that was used to open your colon during the exam.  Do not drive for 24 hours after the procedure.  It is up to you to get the results of your procedure. Ask your health care provider, or the department that is doing the procedure, when your results will be ready. Summary  A colonoscopy is a procedure to look at the entire large  intestine.  Follow instructions from your health care provider about eating and drinking before the procedure.  If you were prescribed an oral bowel prep to clean out your colon, take it as told by your health care provider.  During the colonoscopy, a flexible tube with a camera on its end is inserted into the anus and then passed into the other parts of the large intestine. This information is not intended to replace advice given to you by your health care provider. Make sure you discuss any questions you have with your health care provider. Document Revised: 06/24/2019 Document Reviewed: 06/24/2019 Elsevier Patient Education  [place].

## 2020-11-13 NOTE — Progress Notes (Signed)
Chief Complaint  Patient presents with  . Breast Problem   F/u  1. Chronic knee pain had to be wheel chaired out of work this am and has had 5x pain 9/10 he is also having low back pain and right hip pain relieve by diclofenac 75 mg bid and wants refill  2. HTN sl elevated takes hyzaar 100-25 at night and norvasc 5 mg when he thinks about it but controlled at home per pt he is in pain today  3. Obesity with wt loss down ~30 lbs goal 250-280 never less than this in adult life    Review of Systems  Constitutional: Positive for weight loss.  HENT: Negative for hearing loss.   Eyes: Negative for blurred vision.  Respiratory: Negative for shortness of breath.   Cardiovascular: Negative for chest pain.  Gastrointestinal: Negative for abdominal pain.  Musculoskeletal: Positive for back pain and joint pain.  Skin: Negative for rash.  Neurological: Negative for headaches.  Psychiatric/Behavioral: Negative for depression.   Past Medical History:  Diagnosis Date  . Arthritis    hands, neck, back, knees   . Atrial fibrillation ([place])   . Hyperlipidemia   . Hypertension   . Prediabetes    Past Surgical History:  Procedure Laterality Date  . NO PAST SURGERIES     Family History  Problem Relation Age of Onset  . Arthritis Mother   . Hyperlipidemia Mother   . Stroke Mother   . Mental illness Neg Hx    Social History   Socioeconomic History  . Marital status: Divorced    Spouse name: Not on file  . Number of children: 4  . Years of education: Not on file  . Highest education level: Associate degree: academic program  Occupational History  . Not on file  Tobacco Use  . Smoking status: Former Smoker    Types: Cigarettes    Quit date: 05/29/2016    Years since quitting: 4.4  . Smokeless tobacco: Never Used  Vaping Use  . Vaping Use: Never used  Substance and Sexual Activity  . Alcohol use: Not Currently    Comment: socially  . Drug use: No  . Sexual activity: Yes    Birth  control/protection: Condom  Other Topics Concern  . 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 Difficulty of Paying Living Expenses: Not on file  Food Insecurity:   . Worried About Charity fundraiser in the Last Year: Not on file  . Ran Out of Food in the Last Year: Not on file  Transportation Needs:   . Lack of Transportation (Medical): Not on file  . Lack of Transportation (Non-Medical): Not on file  Physical Activity:   . Days of Exercise per Week: Not on file  . Minutes of Exercise per Session: Not on file  Stress:   . Feeling of Stress : Not on file  Social Connections:   . Frequency of Communication with Friends and Family: Not on file  . Frequency of Social Gatherings with Friends and Family: Not on file  . Attends Religious Services: Not on file  . Active Member of Clubs or Organizations: Not on file  . Attends Archivist Meetings: Not on file  . Marital Status: Not on file  Intimate Partner Violence:   . Fear of Current or Ex-Partner:  Not on file  . Emotionally Abused: Not on file  . Physically Abused: Not on file  . Sexually Abused: Not on file   Current Meds  Medication Sig  . amLODipine (NORVASC) 5 MG tablet Take 1 tablet (5 mg total) by mouth daily.  . Cholecalciferol 1.25 MG (50000 UT) capsule Take 1 capsule (50,000 Units total) by mouth once a week.  . cyclobenzaprine (FLEXERIL) 10 MG tablet Take 1 tablet (10 mg total) by mouth at bedtime as needed for muscle spasms.  . diclofenac (VOLTAREN) 75 MG EC tablet Take 1 tablet (75 mg total) by mouth 2 (two) times daily after a meal. As needed  . diclofenac Sodium (VOLTAREN) 1 % GEL Apply 2-4 g topically 4 (four) times daily. 2 grams upper body and 4 grams lower body qid  . losartan-hydrochlorothiazide (HYZAAR) 100-25 MG tablet Take 1 tablet by mouth daily. In am  . sildenafil (REVATIO) 20  MG tablet Take 1-5 tablets (20-100 mg total) by mouth daily as needed. 30 min to 4 hours before sex  . [DISCONTINUED] amLODipine (NORVASC) 5 MG tablet Take 1 tablet (5 mg total) by mouth daily.  . [DISCONTINUED] Cholecalciferol 1.25 MG (50000 UT) capsule Take 1 capsule (50,000 Units total) by mouth once a week.  . [DISCONTINUED] cyclobenzaprine (FLEXERIL) 10 MG tablet Take 1 tablet (10 mg total) by mouth at bedtime as needed for muscle spasms.  . [DISCONTINUED] diclofenac (VOLTAREN) 75 MG EC tablet Take 1 tablet (75 mg total) by mouth 2 (two) times daily after a meal. As needed  . [DISCONTINUED] losartan-hydrochlorothiazide (HYZAAR) 100-25 MG tablet Take 1 tablet by mouth daily. In am  . [DISCONTINUED] sildenafil (REVATIO) 20 MG tablet Take 1-5 tablets (20-100 mg total) by mouth daily as needed. 30 min to 4 hours before sex   No Known Allergies No results found for this or any previous visit (from the past 2160 hour(s)). Objective  Body mass index is 38.34 kg/m. Wt Readings from Last 3 Encounters:  11/13/20 (!) 315 lb (142.9 kg)  02/01/20 (!) 343 lb 6.4 oz (155.8 kg)  10/18/19 (!) 325 lb (147.4 kg)   Temp Readings from Last 3 Encounters:  11/13/20 97.9 F (36.6 C) (Oral)  02/01/20 98.3 F (36.8 C) (Temporal)  10/18/19 98.2 F (36.8 C)   BP Readings from Last 3 Encounters:  11/13/20 126/90  02/01/20 140/90  10/18/19 (!) 148/103   Pulse Readings from Last 3 Encounters:  11/13/20 (!) 105  02/01/20 81  10/18/19 (!) 104    Physical Exam Vitals and nursing note reviewed.  Constitutional:      Appearance: Normal appearance. He is obese.  HENT:     Head: Normocephalic and atraumatic.     Right Ear: There is impacted cerumen.     Left Ear: There is impacted cerumen.  Eyes:     Conjunctiva/sclera: Conjunctivae normal.     Pupils: Pupils are equal, round, and reactive to light.  Cardiovascular:     Rate and Rhythm: Regular rhythm. Tachycardia present.     Heart sounds: No  murmur heard.   Pulmonary:     Effort: Pulmonary effort is normal.     Breath sounds: Normal breath sounds.  Skin:    General: Skin is warm and dry.  Neurological:     General: No focal deficit present.     Mental Status: He is alert and oriented to person, place, and time. Mental status is at baseline.     Gait: Gait abnormal.  Comments: Limping due to knee pain b/l   Psychiatric:        Attention and Perception: Attention and perception normal.        Mood and Affect: Mood and affect normal.        Speech: Speech normal.        Behavior: Behavior normal.        Thought Content: Thought content normal.        Cognition and Memory: Cognition and memory normal.        Judgment: Judgment normal.     Assessment  Plan  Chronic pain of both knees - Plan: diclofenac (VOLTAREN) 75 MG EC tablet, Ambulatory referral to Orthopedic Surgery  Primary hypertension - Plan: Comprehensive metabolic panel, Lipid panel, CBC with Differential/Platelet  Prediabetes - Plan: Hemoglobin A1c  Screening for blood or protein in urine - Plan: Urinalysis, Routine w reflex microscopic  Thyroid disorder screen - Plan: TSH  Vitamin D deficiency - Plan: Vitamin D (25 hydroxy), Cholecalciferol 1.25 MG (50000 UT) capsule  Encounter for screening for other digestive system disorders - Plan: Hepatitis C antibody  Essential hypertension - Plan: amLODipine (NORVASC) 5 MG tablet, losartan-hydrochlorothiazide (HYZAAR) 100-25 MG tablet  Chronic midline low back pain with sciatica, sciatica laterality unspecified - Plan: cyclobenzaprine (FLEXERIL) 10 MG tablet, Ambulatory referral to Orthopedic Surgery  Lumbar radiculopathy - Plan: cyclobenzaprine (FLEXERIL) 10 MG tablet, Ambulatory referral to Orthopedic Surgery  Erectile dysfunction, unspecified erectile dysfunction type - Plan: sildenafil (REVATIO) 20 MG tablet  Obesity (BMI 30-39.9)  Healthy diet and exercise   HM Declines flu shot  Tdaputd Will  given hep B1/3doses -consider new hep B vaccine x 2 doses in future(with fasting labs 11/2020 and in 1 month)  pna 23 vaccineutd MMR immune covid vaccine had 2/2 pfizer considering booster in future as of 11/13/20  psa check 45  Colonoscopy screen start 45 refer leb congrat smoking and etoh cessation  rec healthy diet and exercise  Former smoker Results for MALACKI, MCPHEARSON (MRN [id number]) as of 11/13/2020 08:21  Ref. Range 11/05/2018 11:58  Anti Nuclear Antibody (ANA) Latest Ref Range: NEGATIVE  NEGATIVE  Cyclic Citrullin Peptide Ab Latest Units: UNITS <16  RA Latex Turbid. Latest Ref Range: <14 IU/mL <14  Results for JEREMIAS, BROYHILL (MRN [id number]) as of 11/13/2020 08:21  Ref. Range 11/05/2018 11:58  Sed Rate Latest Ref Range: 0 - 15 mm/hr 19 (H)  Results for TRAVONNE, SCHOWALTER (MRN [id number]) as of 11/13/2020 08:21  Ref. Range 11/05/2018 11:58  CRP Latest Ref Range: 0.5 - 20.0 mg/dL 0.2 (L)     Provider: Dr. Olivia Mackie McLean-Scocuzza-Internal Medicine

## 2020-11-23 ENCOUNTER — Ambulatory Visit (INDEPENDENT_AMBULATORY_CARE_PROVIDER_SITE_OTHER): Payer: Managed Care, Other (non HMO)

## 2020-11-23 ENCOUNTER — Other Ambulatory Visit: Payer: Self-pay

## 2020-11-23 DIAGNOSIS — R7303 Prediabetes: Secondary | ICD-10-CM

## 2020-11-23 DIAGNOSIS — I10 Essential (primary) hypertension: Secondary | ICD-10-CM | POA: Diagnosis not present

## 2020-11-23 DIAGNOSIS — E559 Vitamin D deficiency, unspecified: Secondary | ICD-10-CM

## 2020-11-23 DIAGNOSIS — Z23 Encounter for immunization: Secondary | ICD-10-CM | POA: Diagnosis not present

## 2020-11-23 DIAGNOSIS — Z13818 Encounter for screening for other digestive system disorders: Secondary | ICD-10-CM

## 2020-11-23 DIAGNOSIS — Z1329 Encounter for screening for other suspected endocrine disorder: Secondary | ICD-10-CM

## 2020-11-23 LAB — CBC WITH DIFFERENTIAL/PLATELET
Basophils Absolute: 0 10*3/uL (ref 0.0–0.1)
Basophils Relative: 0.4 % (ref 0.0–3.0)
Eosinophils Absolute: 0.1 10*3/uL (ref 0.0–0.7)
Eosinophils Relative: 0.8 % (ref 0.0–5.0)
HCT: 44.5 % (ref 39.0–52.0)
Hemoglobin: 14.5 g/dL (ref 13.0–17.0)
Lymphocytes Relative: 29.4 % (ref 12.0–46.0)
Lymphs Abs: 2.1 10*3/uL (ref 0.7–4.0)
MCHC: 32.6 g/dL (ref 30.0–36.0)
MCV: 84.9 fl (ref 78.0–100.0)
Monocytes Absolute: 0.5 10*3/uL (ref 0.1–1.0)
Monocytes Relative: 7.3 % (ref 3.0–12.0)
Neutro Abs: 4.4 10*3/uL (ref 1.4–7.7)
Neutrophils Relative %: 62.1 % (ref 43.0–77.0)
Platelets: 289 10*3/uL (ref 150.0–400.0)
RBC: 5.24 Mil/uL (ref 4.22–5.81)
RDW: 15.6 % — ABNORMAL HIGH (ref 11.5–15.5)
WBC: 7.2 10*3/uL (ref 4.0–10.5)

## 2020-11-23 LAB — COMPREHENSIVE METABOLIC PANEL
ALT: 24 U/L (ref 0–53)
AST: 31 U/L (ref 0–37)
Albumin: 4.8 g/dL (ref 3.5–5.2)
Alkaline Phosphatase: 53 U/L (ref 39–117)
BUN: 20 mg/dL (ref 6–23)
CO2: 30 mEq/L (ref 19–32)
Calcium: 9.3 mg/dL (ref 8.4–10.5)
Chloride: 98 mEq/L (ref 96–112)
Creatinine, Ser: 1.05 mg/dL (ref 0.40–1.50)
GFR: 86.44 mL/min (ref >60.00)
Glucose, Bld: 71 mg/dL (ref 70–99)
Potassium: 4 mEq/L (ref 3.5–5.1)
Sodium: 137 mEq/L (ref 135–145)
Total Bilirubin: 0.5 mg/dL (ref 0.2–1.2)
Total Protein: 8 g/dL (ref 6.0–8.3)

## 2020-11-23 LAB — LIPID PANEL
Cholesterol: 201 mg/dL — ABNORMAL HIGH (ref 0–200)
HDL: 43.7 mg/dL (ref >39.00)
LDL Cholesterol: 142 mg/dL — ABNORMAL HIGH (ref 0–99)
NonHDL: 157.79
Total CHOL/HDL Ratio: 5
Triglycerides: 81 mg/dL (ref 0.0–149.0)
VLDL: 16.2 mg/dL (ref 0.0–40.0)

## 2020-11-23 LAB — TSH: TSH: 1.42 u[IU]/mL (ref 0.35–4.50)

## 2020-11-23 LAB — HEMOGLOBIN A1C: Hgb A1c MFr Bld: 5.8 % (ref 4.6–6.5)

## 2020-11-23 LAB — VITAMIN D 25 HYDROXY (VIT D DEFICIENCY, FRACTURES): VITD: 42.31 ng/mL (ref 30.00–100.00)

## 2020-11-23 NOTE — Progress Notes (Signed)
Patient presented for Hep B injection to right deltoid, patient voiced no concerns nor showed any signs of distress during injection. 

## 2020-11-26 ENCOUNTER — Other Ambulatory Visit: Payer: Self-pay | Admitting: Internal Medicine

## 2020-11-26 DIAGNOSIS — E785 Hyperlipidemia, unspecified: Secondary | ICD-10-CM

## 2020-11-26 LAB — HEPATITIS C ANTIBODY
Hepatitis C Ab: NONREACTIVE
SIGNAL TO CUT-OFF: 0.03 (ref <1.00)

## 2020-12-04 ENCOUNTER — Emergency Department: Payer: Managed Care, Other (non HMO)

## 2020-12-04 ENCOUNTER — Emergency Department
Admission: EM | Admit: 2020-12-04 | Discharge: 2020-12-04 | Disposition: A | Discharge status: Home / Self Care | Payer: Managed Care, Other (non HMO) | Attending: Emergency Medicine | Admitting: Emergency Medicine

## 2020-12-04 ENCOUNTER — Other Ambulatory Visit: Payer: Self-pay

## 2020-12-04 DIAGNOSIS — M160 Bilateral primary osteoarthritis of hip: Secondary | ICD-10-CM

## 2020-12-04 DIAGNOSIS — Z79899 Other long term (current) drug therapy: Secondary | ICD-10-CM | POA: Insufficient documentation

## 2020-12-04 DIAGNOSIS — F17210 Nicotine dependence, cigarettes, uncomplicated: Secondary | ICD-10-CM | POA: Diagnosis not present

## 2020-12-04 DIAGNOSIS — I10 Essential (primary) hypertension: Secondary | ICD-10-CM | POA: Diagnosis not present

## 2020-12-04 DIAGNOSIS — M169 Osteoarthritis of hip, unspecified: Secondary | ICD-10-CM | POA: Insufficient documentation

## 2020-12-04 DIAGNOSIS — M25552 Pain in left hip: Secondary | ICD-10-CM | POA: Diagnosis present

## 2020-12-04 MED ORDER — OXYCODONE-ACETAMINOPHEN 7.5-325 MG PO TABS: 1.0000 | ORAL_TABLET | ORAL | 0 refills | Status: DC | PRN

## 2020-12-04 MED ORDER — LIDOCAINE 5 % EX PTCH
2.0000 | MEDICATED_PATCH | CUTANEOUS | Status: DC
  Administered 2020-12-04: 2 via TRANSDERMAL
  Filled 2020-12-04: qty 2

## 2020-12-04 NOTE — Discharge Instructions (Signed)
Follow-up with schedule orthopedic appointment on January 7.  Be advised medication may cause drowsiness.

## 2020-12-04 NOTE — ED Triage Notes (Addendum)
Pt to ER via POV from home.  Pt here with c/o R hip pain. Reports pain was worse today, it has been bothering him for several weeks. Denies falling. 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 Pt states it hurts, feels it "popping in and out", and is stiff.   Hx hypertension- did not take bp meds today.

## 2020-12-04 NOTE — ED Provider Notes (Signed)
[facility] Emergency Department Provider Note   ____________________________________________   Event Date/Time   First MD Initiated Contact with Patient 12/04/20 1207     (approximate)  I have reviewed the triage vital signs and the nursing notes.   HISTORY  Chief Complaint Hip Pain    HPI Charles Huang is a 44 y.o. male patient presents with right hip pain. Patient pain has been bothering for several weeks but worsening today. Patient has a history of pain for over a year. X-rays taken in March 2020 were suspicious for AVN. Patient does not follow orthopedics as directed for his hip pain. Patient rates the pain today as a 10/10. Patient described pain is achy". Patient the pain is in both hips and increased with ambulation and prolonged standing. No palliative measure for complaint. Patient has an appointment with [facility] on December 21, 2020.         Past Medical History:  Diagnosis Date  . Arthritis    hands, neck, back, knees   . Atrial fibrillation ([facility])   . Hyperlipidemia   . Hypertension   . Prediabetes     Patient Active Problem List   Diagnosis Date Noted  . Obesity (BMI 30-39.9) 11/13/2020  . Annual physical exam 02/02/2020  . Erectile dysfunction 02/02/2020  . Hematuria 02/02/2020  . Evaluation by psychiatric service required 09/06/2019  . Chronic pain of both knees 02/18/2019  . Chronic pain syndrome 08/12/2018  . Vitamin D deficiency 08/12/2018  . Low back pain 02/09/2018  . Paroxysmal A-fib ([facility]) 05/07/2017  . Prediabetes   . Suspected sleep apnea 04/10/2017  . Essential hypertension 04/28/2016  . Hyperlipidemia 04/28/2016  . Morbid obesity with BMI of 40.0-44.9, adult ([facility]) 04/28/2016  . Primary osteoarthritis of both knees 04/28/2016    Past Surgical History:  Procedure Laterality Date  . NO PAST SURGERIES      Prior to Admission medications   Medication Sig Start Date End Date Taking? Authorizing Provider   amLODipine (NORVASC) 5 MG tablet Take 1 tablet (5 mg total) by mouth daily. 11/13/20   McLean-Scocuzza, Pasty Spillersracy N, MD  Cholecalciferol 1.25 MG (50000 UT) capsule Take 1 capsule (50,000 Units total) by mouth once a week. 11/13/20   McLean-Scocuzza, Pasty Spillersracy N, MD  cyclobenzaprine (FLEXERIL) 10 MG tablet Take 1 tablet (10 mg total) by mouth at bedtime as needed for muscle spasms. 11/13/20   McLean-Scocuzza, Pasty Spillersracy N, MD  diclofenac (VOLTAREN) 75 MG EC tablet Take 1 tablet (75 mg total) by mouth 2 (two) times daily after a meal. As needed 11/13/20   McLean-Scocuzza, Pasty Spillersracy N, MD  diclofenac Sodium (VOLTAREN) 1 % GEL Apply 2-4 g topically 4 (four) times daily. 2 grams upper body and 4 grams lower body qid 05/11/20   McLean-Scocuzza, Pasty Spillersracy N, MD  losartan-hydrochlorothiazide (HYZAAR) 100-25 MG tablet Take 1 tablet by mouth daily. In am 11/13/20   McLean-Scocuzza, Pasty Spillersracy N, MD  oxyCODONE-acetaminophen (PERCOCET) 7.5-325 MG tablet Take 1 tablet by mouth every 4 (four) hours as needed for severe pain. 12/04/20   Joni ReiningSmith, Lauren Aguayo K, PA-C  sildenafil (REVATIO) 20 MG tablet Take 1-5 tablets (20-100 mg total) by mouth daily as needed. 30 min to 4 hours before sex 11/13/20   McLean-Scocuzza, Pasty Spillersracy N, MD    Allergies Patient has no known allergies.  Family History  Problem Relation Age of Onset  . Arthritis Mother   . Hyperlipidemia Mother   . Stroke Mother   . Mental illness Neg Hx  Social History Social History   Tobacco Use  . Smoking status: Light Tobacco Smoker    Types: Cigarettes    Last attempt to quit: 05/29/2016    Years since quitting: 4.5  . Smokeless tobacco: Never Used  Vaping Use  . Vaping Use: Never used  Substance Use Topics  . Alcohol use: Not Currently    Comment: socially  . Drug use: No    Review of Systems Constitutional: No fever/chills Eyes: No visual changes. ENT: No sore throat. Cardiovascular: Denies chest pain. Respiratory: Denies shortness of  breath. Gastrointestinal: No abdominal pain.  No nausea, no vomiting.  No diarrhea.  No constipation. Genitourinary: Negative for dysuria. Musculoskeletal: Bilateral hip pain. Skin: Negative for rash. Neurological: Negative for headaches, focal weakness or numbness.   ____________________________________________   PHYSICAL EXAM:  VITAL SIGNS: ED Triage Vitals [12/04/20 1128]  Enc Vitals Group     BP (!) 142/101     Pulse Rate 86     Resp 14     Temp 98.3 F (36.8 C)     Temp Source Oral     SpO2 99 %     Weight 195 lb (88.5 kg)     Height 6\' 4"  (1.93 m)     Head Circumference      Peak Flow      Pain Score 10     Pain Loc      Pain Edu?      Excl. in GC?     Constitutional: Alert and oriented. Well appearing and in no acute distress. Cardiovascular: Normal rate, regular rhythm. Grossly normal heart sounds.  Good peripheral circulation. Respiratory: Normal respiratory effort.  No retractions. Lungs CTAB. Gastrointestinal: Soft and nontender. No distention. No abdominal bruits. No CVA tenderness. Genitourinary: Deferred Musculoskeletal: No leg length discrepancy. Patient has moderate guarding bilateral palpation of the trochanter. Neurologic:  Normal speech and language. No gross focal neurologic deficits are appreciated. No gait instability. Skin:  Skin is warm, dry and intact. No rash noted. No edema or erythema. Psychiatric: Mood and affect are normal. Speech and behavior are normal.  ____________________________________________   LABS (all labs ordered are listed, but only abnormal results are displayed)  Labs Reviewed - No data to display ____________________________________________  EKG   ____________________________________________  RADIOLOGY I, , personally viewed and evaluated these images (plain radiographs) as part of my medical decision making, as well as reviewing the written report by the radiologist.  ED MD interpretation: X-ray of  the patient hips  questionable AVN and marked degenerative changes. Official radiology report(s): MR HIP RIGHT WO CONTRAST  Result Date: 12/04/2020 CLINICAL DATA:  Severe bilateral hip pain for several weeks. No known injury. EXAM: MR OF THE RIGHT HIP WITHOUT CONTRAST TECHNIQUE: Multiplanar, multisequence MR imaging was performed. No intravenous contrast was administered. COMPARISON:  None. FINDINGS: Bones: There is no acute bony or joint abnormality. The patient has advanced bilateral hip osteoarthritis. Degenerative change is worse on the right where there is extensive subchondral cyst formation, flattening and remodeling of the superior aspect of the femoral head and edema about the joint. Bulky osteophytes are present. No avascular necrosis of the femoral heads. No worrisome bony lesion. Articular cartilage and labrum Articular cartilage: Severely degenerated bilaterally with bone-on-bone joint space narrowing, somewhat worse on the right. Labrum:  Severely degenerated bilaterally. Joint or bursal effusion Joint effusion:  None. Bursae: Negative. Muscles and tendons Muscles and tendons:  Intact. Other findings Miscellaneous:   None. IMPRESSION: Severe bilateral  hip osteoarthritis is worse on the right. The exam is otherwise negative. Electronically Signed   By: Drusilla Kanner M.D.   On: 12/04/2020 14:23   MR HIP LEFT WO CONTRAST  Result Date: 12/04/2020 CLINICAL DATA:  Severe bilateral hip pain for several weeks. No known injury. EXAM: MR OF THE RIGHT HIP WITHOUT CONTRAST TECHNIQUE: Multiplanar, multisequence MR imaging was performed. No intravenous contrast was administered. COMPARISON:  None. FINDINGS: Bones: There is no acute bony or joint abnormality. The patient has advanced bilateral hip osteoarthritis. Degenerative change is worse on the right where there is extensive subchondral cyst formation, flattening and remodeling of the superior aspect of the femoral head and edema about the joint.  Bulky osteophytes are present. No avascular necrosis of the femoral heads. No worrisome bony lesion. Articular cartilage and labrum Articular cartilage: Severely degenerated bilaterally with bone-on-bone joint space narrowing, somewhat worse on the right. Labrum:  Severely degenerated bilaterally. Joint or bursal effusion Joint effusion:  None. Bursae: Negative. Muscles and tendons Muscles and tendons:  Intact. Other findings Miscellaneous:   None. IMPRESSION: Severe bilateral hip osteoarthritis is worse on the right. The exam is otherwise negative. Electronically Signed   By: Drusilla Kanner M.D.   On: 12/04/2020 14:23   DG Hip Unilat W or Wo Pelvis 2-3 Views Right  Result Date: 12/04/2020 CLINICAL DATA:  Increasing right hip pain. EXAM: DG HIP (WITH OR WITHOUT PELVIS) 2-3V RIGHT COMPARISON:  AP pelvis and left hip 02/18/2019. FINDINGS: Severe degenerative changes again noted about the right hip. Near complete loss of joint space. Prominent subchondral cysts are present, these are most likely degenerative. Severe degenerative changes left hip are also noted to a slightly lesser degree. Sclerotic changes both femoral heads. This may be degenerative, bilateral femoral avascular necrosis cannot be excluded. No acute bony or joint abnormality identified. No evidence of fracture or dislocation. IMPRESSION: Severe degenerative changes again noted about the both hips, right side greater than left. Sclerotic changes noted of the both femoral heads. This may be degenerative, bilateral avascular necrosis cannot be excluded. MRI may prove useful for further evaluation Electronically Signed   By: Maisie Fus  Register   On: 12/04/2020 12:50    ____________________________________________   PROCEDURES  Procedure(s) performed (including Critical Care):  Procedures   ____________________________________________   INITIAL IMPRESSION / ASSESSMENT AND PLAN / ED COURSE  As part of my medical decision making, I  reviewed the following data within the electronic MEDICAL RECORD NUMBER         Patient presents with worsening bilateral hip pain.  Patient with pain in the right is worse than the left.  Patient x-rays suspicious for AVN.  Further evaluation by MRI revealed no AVN but severe arthritis.  Patient given discharge care instruction advised take medication directed.  Patient vies follow-up with schedule orthopedic appointment on December 21, 2020.      ____________________________________________   FINAL CLINICAL IMPRESSION(S) / ED DIAGNOSES  Final diagnoses:  Osteoarthritis of both hips, unspecified osteoarthritis type     ED Discharge Orders         Ordered    oxyCODONE-acetaminophen (PERCOCET) 7.5-325 MG tablet  Every 4 hours PRN        12/04/20 1454          *Please note:  Charles Huang was evaluated in Emergency Department on 12/04/2020 for the symptoms described in the history of present illness. He was evaluated in the context of the global COVID-19 pandemic, which necessitated consideration that the patient  might be at risk for infection with the SARS-CoV-2 virus that causes COVID-19. Institutional protocols and algorithms that pertain to the evaluation of patients at risk for COVID-19 are in a state of rapid change based on information released by regulatory bodies including the CDC and federal and state organizations. These policies and algorithms were followed during the patient's care in the ED.  Some ED evaluations and interventions may be delayed as a result of limited staffing during and the pandemic.*   Note:  This document was prepared using Dragon voice recognition software and may include unintentional dictation errors.    Joni Reining, PA-C 12/04/20 1457    Merwyn Katos, MD 12/04/20 1556

## 2020-12-17 ENCOUNTER — Encounter: Payer: Self-pay | Admitting: Internal Medicine

## 2020-12-21 ENCOUNTER — Ambulatory Visit: Payer: Managed Care, Other (non HMO)

## 2020-12-24 ENCOUNTER — Telehealth: Payer: Self-pay | Admitting: Internal Medicine

## 2020-12-24 ENCOUNTER — Other Ambulatory Visit: Payer: Self-pay

## 2020-12-24 ENCOUNTER — Ambulatory Visit (INDEPENDENT_AMBULATORY_CARE_PROVIDER_SITE_OTHER): Payer: Managed Care, Other (non HMO)

## 2020-12-24 DIAGNOSIS — Z23 Encounter for immunization: Secondary | ICD-10-CM | POA: Diagnosis not present

## 2020-12-24 NOTE — Telephone Encounter (Signed)
Patient dropped off PreOp form to be completed. Form is up front in Dr. Alford Highland color folder.

## 2020-12-24 NOTE — Progress Notes (Addendum)
Patient presented for B 12 injection to right deltoid, patient voiced no concerns nor showed any signs of distress during injection. 

## 2020-12-25 NOTE — Telephone Encounter (Signed)
Placed on your desk. 

## 2020-12-26 NOTE — Telephone Encounter (Signed)
Complete call patient

## 2020-12-27 NOTE — Telephone Encounter (Signed)
Patient informed and verbalized understanding.  Sent to fax

## 2020-12-27 NOTE — Telephone Encounter (Signed)
Faxed preop clearance form to [facility] [phone]

## 2021-01-03 ENCOUNTER — Other Ambulatory Visit: Payer: Managed Care, Other (non HMO)

## 2021-01-04 ENCOUNTER — Ambulatory Visit: Payer: Self-pay | Admitting: Student

## 2021-01-04 NOTE — Patient Instructions (Addendum)
DUE TO COVID-19 ONLY ONE VISITOR IS ALLOWED TO COME WITH YOU AND STAY IN THE WAITING ROOM ONLY DURING PRE OP AND PROCEDURE DAY OF SURGERY. THE 1 VISITOR  MAY VISIT WITH YOU AFTER SURGERY IN YOUR PRIVATE ROOM DURING VISITING HOURS ONLY!  YOU NEED TO HAVE A COVID 19 TEST ON: 01/07/21 @ 9:55 AM, THIS TEST MUST BE DONE BEFORE SURGERY,  COVID TESTING SITE [address] [place] [address], IT IS ON THE RIGHT GOING OUT [address] APPROXIMATELY  2 MINUTES PAST [employer] ON THE RIGHT. ONCE YOUR COVID TEST IS COMPLETED,  PLEASE BEGIN THE QUARANTINE INSTRUCTIONS AS OUTLINED IN YOUR HANDOUT.                Charles Huang    Your procedure is scheduled on: 01/10/21   Report to [facility] Main  Entrance   Report to admitting at: 7:45 AM     Call this number if you have problems the morning of surgery [phone]    Remember: NO SOLID FOOD AFTER MIDNIGHT THE NIGHT PRIOR TO SURGERY. NOTHING BY MOUTH EXCEPT CLEAR LIQUIDS UNTIL: 7:15 AM . PLEASE FINISH ENSURE DRINK PER SURGEON ORDER  WHICH NEEDS TO BE COMPLETED AT: 7:15 AM .  CLEAR LIQUID DIET   Foods Allowed                                                                     Foods Excluded  Coffee and tea, regular and decaf                             liquids that you cannot  Plain Jell-O any favor except red or purple                                           see through such as: Fruit ices (not with fruit pulp)                                     milk, soups, orange juice  Iced Popsicles                                    All solid food Carbonated beverages, regular and diet                                    Cranberry, grape and apple juices Sports drinks like Gatorade Lightly seasoned clear broth or consume(fat free) Sugar, honey syrup  Sample Menu Breakfast                                Lunch  Supper Cranberry juice                    Beef broth                             Chicken broth Jell-O                                     Grape juice                           Apple juice Coffee or tea                        Jell-O                                      Popsicle                                                Coffee or tea                        Coffee or tea  _____________________________________________________________________  BRUSH YOUR TEETH MORNING OF SURGERY AND RINSE YOUR MOUTH OUT, NO CHEWING GUM CANDY OR MINTS.    Take these medicines the morning of surgery with A SIP OF WATER: amlodipine                               You may not have any metal on your body including hair pins and              piercings  Do not wear jewelry, lotions, powders or perfumes, deodorant             Men may shave face and neck.   Do not bring valuables to the hospital. [facility] IS NOT             RESPONSIBLE   FOR VALUABLES.  Contacts, dentures or bridgework may not be worn into surgery.  Leave suitcase in the car. After surgery it may be brought to your room.     Patients discharged the day of surgery will not be allowed to drive home. IF YOU ARE HAVING SURGERY AND GOING HOME THE SAME DAY, YOU MUST HAVE AN ADULT TO DRIVE YOU HOME AND BE WITH YOU FOR 24 HOURS. YOU MAY GO HOME BY TAXI OR UBER OR ORTHERWISE, BUT AN ADULT MUST ACCOMPANY YOU HOME AND STAY WITH YOU FOR 24 HOURS.  Name and phone number of your driver:  Special Instructions: N/A              Please read over the following fact sheets you were given: _____________________________________________________________________        [facility] - Preparing for Surgery Before surgery, you can play an important role.  Because skin is not sterile, your skin needs to be as free of germs as possible.  You can reduce the number of germs on your skin by washing with CHG (chlorahexidine gluconate) soap before  surgery.  CHG is an antiseptic cleaner which kills germs and bonds with the skin to continue killing germs even  after washing. Please DO NOT use if you have an allergy to CHG or antibacterial soaps.  If your skin becomes reddened/irritated stop using the CHG and inform your nurse when you arrive at Short Stay. Do not shave (including legs and underarms) for at least 48 hours prior to the first CHG shower.  You may shave your face/neck. Please follow these instructions carefully:  1.  Shower with CHG Soap the night before surgery and the  morning of Surgery.  2.  If you choose to wash your hair, wash your hair first as usual with your  normal  shampoo.  3.  After you shampoo, rinse your hair and body thoroughly to remove the  shampoo.                           4.  Use CHG as you would any other liquid soap.  You can apply chg directly  to the skin and wash                       Gently with a scrungie or clean washcloth.  5.  Apply the CHG Soap to your body ONLY FROM THE NECK DOWN.   Do not use on face/ open                           Wound or open sores. Avoid contact with eyes, ears mouth and genitals (private parts).                       Wash face,  Genitals (private parts) with your normal soap.             6.  Wash thoroughly, paying special attention to the area where your surgery  will be performed.  7.  Thoroughly rinse your body with warm water from the neck down.  8.  DO NOT shower/wash with your normal soap after using and rinsing off  the CHG Soap.                9.  Pat yourself dry with a clean towel.            10.  Wear clean pajamas.            11.  Place clean sheets on your bed the night of your first shower and do not  sleep with pets. Day of Surgery : Do not apply any lotions/deodorants the morning of surgery.  Please wear clean clothes to the hospital/surgery center.  FAILURE TO FOLLOW THESE INSTRUCTIONS MAY RESULT IN THE CANCELLATION OF YOUR SURGERY PATIENT SIGNATURE_________________________________  NURSE  SIGNATURE__________________________________  ________________________________________________________________________

## 2021-01-04 NOTE — Progress Notes (Signed)
Pt. Needs orders for upcomming surgery. PAT and labs on: 01/07/21.Thanks.

## 2021-01-07 ENCOUNTER — Other Ambulatory Visit: Payer: Self-pay

## 2021-01-07 ENCOUNTER — Other Ambulatory Visit (HOSPITAL_COMMUNITY)
Admission: RE | Admit: 2021-01-07 | Discharge: 2021-01-07 | Disposition: A | Discharge status: Home / Self Care | Payer: Managed Care, Other (non HMO) | Source: Ambulatory Visit | Attending: Orthopedic Surgery | Admitting: Orthopedic Surgery

## 2021-01-07 ENCOUNTER — Encounter (HOSPITAL_COMMUNITY): Payer: Self-pay

## 2021-01-07 ENCOUNTER — Encounter (HOSPITAL_COMMUNITY)
Admission: RE | Admit: 2021-01-07 | Discharge: 2021-01-07 | Disposition: A | Discharge status: Home / Self Care | Payer: Managed Care, Other (non HMO) | Source: Ambulatory Visit | Attending: Orthopedic Surgery | Admitting: Orthopedic Surgery

## 2021-01-07 DIAGNOSIS — Z20822 Contact with and (suspected) exposure to covid-19: Secondary | ICD-10-CM | POA: Insufficient documentation

## 2021-01-07 DIAGNOSIS — Z01812 Encounter for preprocedural laboratory examination: Secondary | ICD-10-CM | POA: Diagnosis present

## 2021-01-07 HISTORY — DX: Cardiac arrhythmia, unspecified: I49.9

## 2021-01-07 HISTORY — DX: Prediabetes: R73.03

## 2021-01-07 LAB — CBC
HCT: 44.2 % (ref 39.0–52.0)
Hemoglobin: 14.4 g/dL (ref 13.0–17.0)
MCH: 27.7 pg (ref 26.0–34.0)
MCHC: 32.6 g/dL (ref 30.0–36.0)
MCV: 85.2 fL (ref 80.0–100.0)
Platelets: 308 10*3/uL (ref 150–400)
RBC: 5.19 MIL/uL (ref 4.22–5.81)
RDW: 14.6 % (ref 11.5–15.5)
WBC: 8.4 10*3/uL (ref 4.0–10.5)
nRBC: 0 % (ref 0.0–0.2)

## 2021-01-07 LAB — PROTIME-INR
INR: 1 (ref 0.8–1.2)
Prothrombin Time: 13.1 seconds (ref 11.4–15.2)

## 2021-01-07 LAB — URINALYSIS, ROUTINE W REFLEX MICROSCOPIC
Bilirubin Urine: NEGATIVE
Glucose, UA: NEGATIVE mg/dL
Hgb urine dipstick: NEGATIVE
Ketones, ur: NEGATIVE mg/dL
Leukocytes,Ua: NEGATIVE
Nitrite: NEGATIVE
Protein, ur: NEGATIVE mg/dL
Specific Gravity, Urine: 1.023 (ref 1.005–1.030)
pH: 5 (ref 5.0–8.0)

## 2021-01-07 LAB — COMPREHENSIVE METABOLIC PANEL
ALT: 35 U/L (ref 0–44)
AST: 26 U/L (ref 15–41)
Albumin: 4.2 g/dL (ref 3.5–5.0)
Alkaline Phosphatase: 52 U/L (ref 38–126)
Anion gap: 13 (ref 5–15)
BUN: 21 mg/dL — ABNORMAL HIGH (ref 6–20)
CO2: 25 mmol/L (ref 22–32)
Calcium: 9.6 mg/dL (ref 8.9–10.3)
Chloride: 100 mmol/L (ref 98–111)
Creatinine, Ser: 1.1 mg/dL (ref 0.61–1.24)
GFR, Estimated: >60 mL/min (ref >60)
Glucose, Bld: 97 mg/dL (ref 70–99)
Potassium: 4.1 mmol/L (ref 3.5–5.1)
Sodium: 138 mmol/L (ref 135–145)
Total Bilirubin: 0.4 mg/dL (ref 0.3–1.2)
Total Protein: 7.8 g/dL (ref 6.5–8.1)

## 2021-01-07 LAB — SARS CORONAVIRUS 2 (TAT 6-24 HRS): SARS Coronavirus 2: NEGATIVE

## 2021-01-07 LAB — SURGICAL PCR SCREEN
MRSA, PCR: NEGATIVE
Staphylococcus aureus: POSITIVE — AB

## 2021-01-07 NOTE — Progress Notes (Signed)
COVID Vaccine Completed: Yes Date COVID Vaccine completed:04/27/20 COVID vaccine manufacturer: Pfizer      PCP - Dr. Annett Gula. Cardiologist - NO  Chest x-ray -  EKG -  Stress Test -  ECHO -  Cardiac Cath -  Pacemaker/ICD device last checked: A1-C: 5.8: 11/23/20 Sleep Study -  CPAP -   Fasting Blood Sugar -  Checks Blood Sugar _____ times a day  Blood Thinner Instructions: Aspirin Instructions: Last Dose:  Anesthesia review: Hx: HTN,Afib  Patient denies shortness of breath, fever, cough and chest pain at PAT appointment   Patient verbalized understanding of instructions that were given to them at the PAT appointment. Patient was also instructed that they will need to review over the PAT instructions again at home before surgery.

## 2021-01-07 NOTE — Progress Notes (Signed)
PCR: Positive STAPH. 

## 2021-01-09 MED ORDER — DEXTROSE 5 % IV SOLN
3.0000 g | INTRAVENOUS | Status: AC
  Administered 2021-01-10: 3 g via INTRAVENOUS
  Filled 2021-01-09: qty 3

## 2021-01-09 NOTE — Anesthesia Preprocedure Evaluation (Signed)
Anesthesia Evaluation  Patient identified by MRN, date of birth, ID band  Reviewed: Allergy & Precautions, NPO status , Patient's Chart, lab work & pertinent test results  Airway Mallampati: III  TM Distance: >3 FB Neck ROM: Full    Dental no notable dental hx. (+) Teeth Intact, Dental Advisory Given   Pulmonary sleep apnea , Current Smoker and Patient abstained from smoking.,    Pulmonary exam normal breath sounds clear to auscultation       Cardiovascular hypertension, Pt. on medications Normal cardiovascular exam+ dysrhythmias Atrial Fibrillation  Rhythm:Regular Rate:Normal     Neuro/Psych negative neurological ROS  negative psych ROS   GI/Hepatic negative GI ROS, Neg liver ROS,   Endo/Other  negative endocrine ROS  Renal/GU negative Renal ROSK+ 4.1 Cr 1.10  negative genitourinary   Musculoskeletal  (+) Arthritis ,   Abdominal (+) + obese,   Peds  Hematology Hgb14.4   Anesthesia Other Findings   Reproductive/Obstetrics negative OB ROS                            Anesthesia Physical Anesthesia Plan  ASA: III  Anesthesia Plan: Spinal   Post-op Pain Management:    Induction:   PONV Risk Score and Plan: 1 and Treatment may vary due to age or medical condition, Midazolam, Ondansetron and Dexamethasone  Airway Management Planned: Nasal Cannula and Natural Airway  Additional Equipment: None  Intra-op Plan:   Post-operative Plan:   Informed Consent: I have reviewed the patients History and Physical, chart, labs and discussed the procedure including the risks, benefits and alternatives for the proposed anesthesia with the patient or authorized representative who has indicated his/her understanding and acceptance.     Dental advisory given  Plan Discussed with: CRNA and Anesthesiologist  Anesthesia Plan Comments:        Anesthesia Quick Evaluation

## 2021-01-10 ENCOUNTER — Encounter (HOSPITAL_COMMUNITY): Payer: Self-pay | Admitting: Orthopedic Surgery

## 2021-01-10 ENCOUNTER — Ambulatory Visit (HOSPITAL_COMMUNITY): Payer: Managed Care, Other (non HMO)

## 2021-01-10 ENCOUNTER — Ambulatory Visit (HOSPITAL_COMMUNITY): Payer: Managed Care, Other (non HMO) | Admitting: Anesthesiology

## 2021-01-10 ENCOUNTER — Encounter (HOSPITAL_COMMUNITY)
Admission: RE | Disposition: A | Discharge status: Home / Self Care | Payer: Self-pay | Source: Home / Self Care | Attending: Orthopedic Surgery

## 2021-01-10 ENCOUNTER — Ambulatory Visit (HOSPITAL_COMMUNITY): Payer: Managed Care, Other (non HMO) | Admitting: Physician Assistant

## 2021-01-10 ENCOUNTER — Ambulatory Visit (HOSPITAL_COMMUNITY)
Admission: RE | Admit: 2021-01-10 | Discharge: 2021-01-10 | Disposition: A | Discharge status: Home / Self Care | Payer: Managed Care, Other (non HMO) | Attending: Orthopedic Surgery | Admitting: Orthopedic Surgery

## 2021-01-10 DIAGNOSIS — M25751 Osteophyte, right hip: Secondary | ICD-10-CM | POA: Insufficient documentation

## 2021-01-10 DIAGNOSIS — M1611 Unilateral primary osteoarthritis, right hip: Secondary | ICD-10-CM | POA: Diagnosis present

## 2021-01-10 DIAGNOSIS — F17210 Nicotine dependence, cigarettes, uncomplicated: Secondary | ICD-10-CM | POA: Insufficient documentation

## 2021-01-10 DIAGNOSIS — Z09 Encounter for follow-up examination after completed treatment for conditions other than malignant neoplasm: Secondary | ICD-10-CM

## 2021-01-10 DIAGNOSIS — Z419 Encounter for procedure for purposes other than remedying health state, unspecified: Secondary | ICD-10-CM

## 2021-01-10 DIAGNOSIS — M160 Bilateral primary osteoarthritis of hip: Secondary | ICD-10-CM | POA: Diagnosis present

## 2021-01-10 HISTORY — PX: TOTAL HIP ARTHROPLASTY: SHX124

## 2021-01-10 LAB — TYPE AND SCREEN
ABO/RH(D): O POS
Antibody Screen: NEGATIVE

## 2021-01-10 LAB — ABO/RH: ABO/RH(D): O POS

## 2021-01-10 SURGERY — ARTHROPLASTY, HIP, TOTAL, ANTERIOR APPROACH
Anesthesia: Spinal | Site: Hip | Laterality: Right

## 2021-01-10 MED ORDER — ONDANSETRON HCL 4 MG/2ML IJ SOLN
INTRAMUSCULAR | Status: DC | PRN
  Administered 2021-01-10: 4 mg via INTRAVENOUS

## 2021-01-10 MED ORDER — KETOROLAC TROMETHAMINE 30 MG/ML IJ SOLN
INTRAMUSCULAR | Status: DC | PRN
  Administered 2021-01-10: 30 mg

## 2021-01-10 MED ORDER — SODIUM CHLORIDE (PF) 0.9 % IJ SOLN
INTRAMUSCULAR | Status: DC | PRN
  Administered 2021-01-10: 30 mL

## 2021-01-10 MED ORDER — POVIDONE-IODINE 10 % EX SWAB: 2.0000 "application " | Freq: Once | CUTANEOUS | Status: DC

## 2021-01-10 MED ORDER — OXYCODONE HCL 5 MG/5ML PO SOLN: 5.0000 mg | Freq: Once | ORAL | Status: AC | PRN

## 2021-01-10 MED ORDER — SENNA 8.6 MG PO TABS: 2.0000 | ORAL_TABLET | Freq: Every day | ORAL | 1 refills | Status: AC

## 2021-01-10 MED ORDER — ONDANSETRON HCL 4 MG/2ML IJ SOLN: 4.0000 mg | Freq: Once | INTRAMUSCULAR | Status: DC | PRN

## 2021-01-10 MED ORDER — BUPIVACAINE-EPINEPHRINE 0.25% -1:200000 IJ SOLN
INTRAMUSCULAR | Status: DC | PRN
  Administered 2021-01-10: 30 mL

## 2021-01-10 MED ORDER — KETOROLAC TROMETHAMINE 0.5 % OP SOLN
1.0000 [drp] | Freq: Three times a day (TID) | OPHTHALMIC | Status: DC | PRN
  Administered 2021-01-10: 1 [drp] via OPHTHALMIC
  Filled 2021-01-10: qty 5

## 2021-01-10 MED ORDER — PROPOFOL 10 MG/ML IV BOLUS
INTRAVENOUS | Status: AC
  Filled 2021-01-10: qty 20

## 2021-01-10 MED ORDER — DOCUSATE SODIUM 100 MG PO CAPS: 100.0000 mg | ORAL_CAPSULE | Freq: Two times a day (BID) | ORAL | 1 refills | Status: AC

## 2021-01-10 MED ORDER — TRANEXAMIC ACID-NACL 1000-0.7 MG/100ML-% IV SOLN
1000.0000 mg | Freq: Once | INTRAVENOUS | Status: AC
  Administered 2021-01-10: 1000 mg via INTRAVENOUS

## 2021-01-10 MED ORDER — LACTATED RINGERS IV SOLN: INTRAVENOUS | Status: DC

## 2021-01-10 MED ORDER — CEFAZOLIN SODIUM-DEXTROSE 2-4 GM/100ML-% IV SOLN
2.0000 g | Freq: Four times a day (QID) | INTRAVENOUS | Status: DC
  Administered 2021-01-10: 2 g via INTRAVENOUS

## 2021-01-10 MED ORDER — ACETAMINOPHEN 10 MG/ML IV SOLN
1000.0000 mg | Freq: Once | INTRAVENOUS | Status: AC
  Administered 2021-01-10: 1000 mg via INTRAVENOUS
  Filled 2021-01-10: qty 100

## 2021-01-10 MED ORDER — WATER FOR IRRIGATION, STERILE IR SOLN
Status: DC | PRN
  Administered 2021-01-10: 1000 mL

## 2021-01-10 MED ORDER — BUPIVACAINE-EPINEPHRINE (PF) 0.25% -1:200000 IJ SOLN
INTRAMUSCULAR | Status: AC
  Filled 2021-01-10: qty 30

## 2021-01-10 MED ORDER — HYDROMORPHONE HCL 2 MG PO TABS: 2.0000 mg | ORAL_TABLET | ORAL | 0 refills | Status: AC | PRN

## 2021-01-10 MED ORDER — PHENYLEPHRINE 40 MCG/ML (10ML) SYRINGE FOR IV PUSH (FOR BLOOD PRESSURE SUPPORT)
PREFILLED_SYRINGE | INTRAVENOUS | Status: AC
  Filled 2021-01-10: qty 20

## 2021-01-10 MED ORDER — BSS IO SOLN
15.0000 mL | Freq: Once | INTRAOCULAR | Status: AC
  Administered 2021-01-10: 15 mL
  Filled 2021-01-10: qty 15

## 2021-01-10 MED ORDER — ASPIRIN 81 MG PO CHEW: 81.0000 mg | CHEWABLE_TABLET | Freq: Two times a day (BID) | ORAL | 0 refills | Status: AC

## 2021-01-10 MED ORDER — TRANEXAMIC ACID-NACL 1000-0.7 MG/100ML-% IV SOLN
1000.0000 mg | INTRAVENOUS | Status: AC
  Administered 2021-01-10: 1000 mg via INTRAVENOUS
  Filled 2021-01-10: qty 100

## 2021-01-10 MED ORDER — DEXAMETHASONE SODIUM PHOSPHATE 10 MG/ML IJ SOLN
INTRAMUSCULAR | Status: DC | PRN
  Administered 2021-01-10: 8 mg via INTRAVENOUS

## 2021-01-10 MED ORDER — ISOPROPYL ALCOHOL 70 % SOLN
Status: DC | PRN
  Administered 2021-01-10: 1 via TOPICAL

## 2021-01-10 MED ORDER — OXYCODONE HCL 5 MG PO TABS
ORAL_TABLET | ORAL | Status: AC
  Filled 2021-01-10: qty 1

## 2021-01-10 MED ORDER — CEFAZOLIN SODIUM-DEXTROSE 2-4 GM/100ML-% IV SOLN
INTRAVENOUS | Status: AC
  Filled 2021-01-10: qty 100

## 2021-01-10 MED ORDER — POVIDONE-IODINE 10 % EX SWAB
2.0000 "application " | Freq: Once | CUTANEOUS | Status: AC
  Administered 2021-01-10: 2 via TOPICAL

## 2021-01-10 MED ORDER — AMISULPRIDE (ANTIEMETIC) 5 MG/2ML IV SOLN: 10.0000 mg | Freq: Once | INTRAVENOUS | Status: DC | PRN

## 2021-01-10 MED ORDER — PROPOFOL 10 MG/ML IV BOLUS
INTRAVENOUS | Status: DC | PRN
  Administered 2021-01-10: 20 mg via INTRAVENOUS
  Administered 2021-01-10: 50 mg via INTRAVENOUS
  Administered 2021-01-10 (×6): 20 mg via INTRAVENOUS

## 2021-01-10 MED ORDER — TRANEXAMIC ACID-NACL 1000-0.7 MG/100ML-% IV SOLN
INTRAVENOUS | Status: AC
  Filled 2021-01-10: qty 100

## 2021-01-10 MED ORDER — CHLORHEXIDINE GLUCONATE 0.12 % MT SOLN
15.0000 mL | Freq: Once | OROMUCOSAL | Status: AC
  Administered 2021-01-10: 15 mL via OROMUCOSAL

## 2021-01-10 MED ORDER — PROPOFOL 500 MG/50ML IV EMUL
INTRAVENOUS | Status: DC | PRN
  Administered 2021-01-10: 140 ug/kg/min via INTRAVENOUS

## 2021-01-10 MED ORDER — HYDROMORPHONE HCL 1 MG/ML IJ SOLN: 0.2500 mg | INTRAMUSCULAR | Status: DC | PRN

## 2021-01-10 MED ORDER — PROPOFOL 500 MG/50ML IV EMUL
INTRAVENOUS | Status: AC
  Filled 2021-01-10: qty 50

## 2021-01-10 MED ORDER — MEPERIDINE HCL 50 MG/ML IJ SOLN: 6.2500 mg | INTRAMUSCULAR | Status: DC | PRN

## 2021-01-10 MED ORDER — MIDAZOLAM HCL 2 MG/2ML IJ SOLN
INTRAMUSCULAR | Status: DC | PRN
  Administered 2021-01-10: 2 mg via INTRAVENOUS

## 2021-01-10 MED ORDER — KETOROLAC TROMETHAMINE 15 MG/ML IJ SOLN
INTRAMUSCULAR | Status: AC
  Administered 2021-01-10: 15 mg via INTRAVENOUS
  Filled 2021-01-10: qty 1

## 2021-01-10 MED ORDER — ACETAMINOPHEN 10 MG/ML IV SOLN: 1000.0000 mg | Freq: Once | INTRAVENOUS | Status: DC | PRN

## 2021-01-10 MED ORDER — KETOROLAC TROMETHAMINE 15 MG/ML IJ SOLN: 15.0000 mg | Freq: Four times a day (QID) | INTRAMUSCULAR | Status: DC

## 2021-01-10 MED ORDER — ORAL CARE MOUTH RINSE: 15.0000 mL | Freq: Once | OROMUCOSAL | Status: AC

## 2021-01-10 MED ORDER — METHOCARBAMOL 500 MG IVPB - SIMPLE MED: 500.0000 mg | Freq: Four times a day (QID) | INTRAVENOUS | Status: DC | PRN

## 2021-01-10 MED ORDER — ONDANSETRON HCL 4 MG PO TABS: 4.0000 mg | ORAL_TABLET | Freq: Three times a day (TID) | ORAL | 0 refills | Status: DC | PRN

## 2021-01-10 MED ORDER — OXYCODONE HCL 5 MG PO TABS
5.0000 mg | ORAL_TABLET | Freq: Once | ORAL | Status: AC | PRN
  Administered 2021-01-10: 5 mg via ORAL

## 2021-01-10 MED ORDER — PROPOFOL 1000 MG/100ML IV EMUL
INTRAVENOUS | Status: AC
  Filled 2021-01-10: qty 100

## 2021-01-10 MED ORDER — PHENYLEPHRINE HCL-NACL 10-0.9 MG/250ML-% IV SOLN
INTRAVENOUS | Status: DC | PRN
Start: 2021-01-10 — End: 2021-01-10
  Administered 2021-01-10: 50 ug/min via INTRAVENOUS

## 2021-01-10 MED ORDER — SODIUM CHLORIDE 0.9 % IV SOLN: INTRAVENOUS | Status: DC

## 2021-01-10 MED ORDER — ONDANSETRON HCL 4 MG/2ML IJ SOLN
INTRAMUSCULAR | Status: AC
  Filled 2021-01-10: qty 2

## 2021-01-10 MED ORDER — DEXAMETHASONE SODIUM PHOSPHATE 10 MG/ML IJ SOLN
INTRAMUSCULAR | Status: AC
  Filled 2021-01-10: qty 1

## 2021-01-10 MED ORDER — METHOCARBAMOL 500 MG IVPB - SIMPLE MED
INTRAVENOUS | Status: AC
  Administered 2021-01-10: 500 mg via INTRAVENOUS
  Filled 2021-01-10: qty 50

## 2021-01-10 MED ORDER — MIDAZOLAM HCL 2 MG/2ML IJ SOLN
INTRAMUSCULAR | Status: AC
  Filled 2021-01-10: qty 2

## 2021-01-10 MED ORDER — SODIUM CHLORIDE 0.9 % IR SOLN
Status: DC | PRN
  Administered 2021-01-10: 2000 mL

## 2021-01-10 MED ORDER — PHENYLEPHRINE 40 MCG/ML (10ML) SYRINGE FOR IV PUSH (FOR BLOOD PRESSURE SUPPORT)
PREFILLED_SYRINGE | INTRAVENOUS | Status: DC | PRN
  Administered 2021-01-10: 40 ug via INTRAVENOUS
  Administered 2021-01-10 (×3): 120 ug via INTRAVENOUS
  Administered 2021-01-10: 80 ug via INTRAVENOUS
  Administered 2021-01-10: 120 ug via INTRAVENOUS

## 2021-01-10 MED ORDER — KETOROLAC TROMETHAMINE 30 MG/ML IJ SOLN
INTRAMUSCULAR | Status: AC
  Filled 2021-01-10: qty 1

## 2021-01-10 MED ORDER — METHOCARBAMOL 500 MG PO TABS: 500.0000 mg | ORAL_TABLET | Freq: Four times a day (QID) | ORAL | Status: DC | PRN

## 2021-01-10 MED ORDER — HYDROMORPHONE HCL 2 MG PO TABS: 2.0000 mg | ORAL_TABLET | ORAL | Status: DC | PRN

## 2021-01-10 MED ORDER — BUPIVACAINE HCL (PF) 0.5 % IJ SOLN
INTRAMUSCULAR | Status: DC | PRN
  Administered 2021-01-10: 15 mg via INTRATHECAL

## 2021-01-10 MED ORDER — LACTATED RINGERS IV BOLUS
500.0000 mL | Freq: Once | INTRAVENOUS | Status: AC
  Administered 2021-01-10: 500 mL via INTRAVENOUS

## 2021-01-10 SURGICAL SUPPLY — 62 items
BAG DECANTER FOR FLEXI CONT (MISCELLANEOUS) IMPLANT
BAG ZIPLOCK 12X15 (MISCELLANEOUS) IMPLANT
BLADE SURG SZ10 CARB STEEL (BLADE) IMPLANT
CHLORAPREP W/TINT 26 (MISCELLANEOUS) ×2 IMPLANT
COVER PERINEAL POST (MISCELLANEOUS) ×2 IMPLANT
COVER SURGICAL LIGHT HANDLE (MISCELLANEOUS) ×2 IMPLANT
COVER WAND RF STERILE (DRAPES) IMPLANT
CUP ACET PNNCL SECTR W/GRIP 56 (Hips) ×1 IMPLANT
DECANTER SPIKE VIAL GLASS SM (MISCELLANEOUS) ×2 IMPLANT
DERMABOND ADVANCED (GAUZE/BANDAGES/DRESSINGS) ×1
DERMABOND ADVANCED .7 DNX12 (GAUZE/BANDAGES/DRESSINGS) ×1 IMPLANT
DRAPE IMP U-DRAPE 54X76 (DRAPES) ×2 IMPLANT
DRAPE SHEET LG 3/4 BI-LAMINATE (DRAPES) ×6 IMPLANT
DRAPE STERI IOBAN 125X83 (DRAPES) IMPLANT
DRAPE U-SHAPE 47X51 STRL (DRAPES) ×4 IMPLANT
DRSG AQUACEL AG ADV 3.5X10 (GAUZE/BANDAGES/DRESSINGS) ×2 IMPLANT
ELECT REM PT RETURN 15FT ADLT (MISCELLANEOUS) ×2 IMPLANT
GAUZE SPONGE 4X4 12PLY STRL (GAUZE/BANDAGES/DRESSINGS) ×2 IMPLANT
GLOVE BIO SURGEON STRL SZ8.5 (GLOVE) ×4 IMPLANT
GLOVE BIOGEL PI IND STRL 8.5 (GLOVE) ×1 IMPLANT
GLOVE BIOGEL PI INDICATOR 8.5 (GLOVE) ×1
GLOVE SRG 8 PF TXTR STRL LF DI (GLOVE) ×1 IMPLANT
GLOVE SURG ENC TEXT LTX SZ7.5 (GLOVE) ×4 IMPLANT
GLOVE SURG UNDER POLY LF SZ8 (GLOVE) ×1
GOWN SPEC L3 XXLG W/TWL (GOWN DISPOSABLE) ×2 IMPLANT
GOWN STRL REUS W/ TWL LRG LVL3 (GOWN DISPOSABLE) ×1 IMPLANT
GOWN STRL REUS W/TWL LRG LVL3 (GOWN DISPOSABLE) ×1
HANDPIECE INTERPULSE COAX TIP (DISPOSABLE) ×1
HEAD CERAMIC 36 PLUS5 (Hips) ×2 IMPLANT
HEAD CERAMIC DELTA 36 PLUS 1.5 (Hips) IMPLANT
HOLDER FOLEY CATH W/STRAP (MISCELLANEOUS) ×2 IMPLANT
HOOD PEEL AWAY FLYTE STAYCOOL (MISCELLANEOUS) ×8 IMPLANT
JET LAVAGE IRRISEPT WOUND (IRRIGATION / IRRIGATOR) ×2
KIT TURNOVER KIT A (KITS) IMPLANT
LAVAGE JET IRRISEPT WOUND (IRRIGATION / IRRIGATOR) ×1 IMPLANT
LINER NEUTRAL 52MMX36MMX56N (Liner) ×2 IMPLANT
MANIFOLD NEPTUNE II (INSTRUMENTS) ×2 IMPLANT
MARKER SKIN DUAL TIP RULER LAB (MISCELLANEOUS) ×2 IMPLANT
NDL SAFETY ECLIPSE 18X1.5 (NEEDLE) ×1 IMPLANT
NEEDLE HYPO 18GX1.5 SHARP (NEEDLE) ×1
NEEDLE SPNL 18GX3.5 QUINCKE PK (NEEDLE) ×2 IMPLANT
PACK ANTERIOR HIP CUSTOM (KITS) ×2 IMPLANT
PENCIL SMOKE EVACUATOR (MISCELLANEOUS) IMPLANT
PINN SECTOR W/GRIP ACE CUP 56 (Hips) ×2 IMPLANT
SAW OSC TIP CART 19.5X105X1.3 (SAW) ×2 IMPLANT
SEALER BIPOLAR AQUA 6.0 (INSTRUMENTS) ×2 IMPLANT
SET HNDPC FAN SPRY TIP SCT (DISPOSABLE) ×1 IMPLANT
STEM TRI LOC BPS SZ4 W GRIPTON (Hips) ×1 IMPLANT
SUT ETHIBOND NAB CT1 #1 30IN (SUTURE) ×4 IMPLANT
SUT MNCRL AB 3-0 PS2 18 (SUTURE) ×2 IMPLANT
SUT MNCRL AB 4-0 PS2 18 (SUTURE) ×2 IMPLANT
SUT MON AB 2-0 CT1 36 (SUTURE) ×4 IMPLANT
SUT STRATAFIX PDO 1 14 VIOLET (SUTURE) ×1
SUT STRATFX PDO 1 14 VIOLET (SUTURE) ×1
SUT VIC AB 2-0 CT1 27 (SUTURE) ×1
SUT VIC AB 2-0 CT1 TAPERPNT 27 (SUTURE) ×1 IMPLANT
SUTURE STRATFX PDO 1 14 VIOLET (SUTURE) ×1 IMPLANT
SYR 3ML LL SCALE MARK (SYRINGE) ×2 IMPLANT
TRAY FOLEY MTR SLVR 16FR STAT (SET/KITS/TRAYS/PACK) IMPLANT
TRI LOC BPS SZ 4 W GRIPTON (Hips) ×2 IMPLANT
TUBE SUCTION HIGH CAP CLEAR NV (SUCTIONS) ×2 IMPLANT
WATER STERILE IRR 1000ML POUR (IV SOLUTION) ×2 IMPLANT

## 2021-01-10 NOTE — Anesthesia Postprocedure Evaluation (Signed)
Anesthesia Post Note  Patient: Charles Huang  Procedure(s) Performed: TOTAL HIP ARTHROPLASTY ANTERIOR APPROACH (Right Hip)     Patient location during evaluation: Nursing Unit Anesthesia Type: Spinal Level of consciousness: oriented and awake and alert Pain management: pain level controlled Vital Signs Assessment: post-procedure vital signs reviewed and stable Respiratory status: spontaneous breathing and respiratory function stable Cardiovascular status: blood pressure returned to baseline and stable Postop Assessment: no headache, no backache, no apparent nausea or vomiting and patient able to bend at knees Anesthetic complications: no   No complications documented.  Last Vitals:  Vitals:   01/10/21 1345 01/10/21 1400  BP: 101/62   Pulse: 90   Resp: 16 15  Temp:    SpO2: 100%     Last Pain:  Vitals:   01/10/21 0832  TempSrc:   PainSc: 6                  Trevor Iha

## 2021-01-10 NOTE — Evaluation (Signed)
Physical Therapy Evaluation Patient Details Name: Charles Huang MRN: [id number] DOB: 1976/08/26 Today's Date: 01/10/2021   History of Present Illness  Patient is 45 y.o. male s/p Rt THA anterior approach on 01/10/21 with PMH significant for HTN, HLD, A-fib, OA, pre-diabetes.    Clinical Impression  Charles Huang is a 46 y.o. male POD 0 s/p Rt THA. Patient reports independence with mobility but very limited by pain at baseline. Patient is now limited by functional impairments (see PT problem list below) and requires min guard/supervision for transfers and gait with RW. Patient was able to ambulate ~80 feet with RW and min guard/supervision and cues for safe walker management. Patient educated on safe sequencing for stair mobility and verbalized safe guarding position for people assisting with mobility. Patient instructed in exercises to facilitate ROM and circulation. Patient will benefit from continued skilled PT interventions to address impairments and progress towards PLOF. Patient has met mobility goals at adequate level for discharge home; will continue to follow if pt continues acute stay to progress towards Mod I goals.     Follow Up Recommendations Home health PT (STRONGLY RECOMMEND HHPT: pt would greatly benefit from HHPT due to pain and significant limitations related to bil hip and knee pain)    Equipment Recommendations  Rolling walker with 5" wheels    Recommendations for Other Services       Precautions / Restrictions Precautions Precautions: Fall Restrictions Weight Bearing Restrictions: No Other Position/Activity Restrictions: WBAT      Mobility  Bed Mobility Overal bed mobility: Needs Assistance Bed Mobility: Supine to Sit;Sit to Supine     Supine to sit: Min guard;HOB elevated Sit to supine: Min assist;HOB elevated   General bed mobility comments: educated on use of belt to assist with mobilizing Rt LE off/onto bed. Assist required to bring Bil LE's on bed secondary  to weakness/pain and buring at Rt hip.    Transfers Overall transfer level: Needs assistance Equipment used: Rolling walker (2 wheeled) Transfers: Sit to/from Stand Sit to Stand: Min guard;Min assist;From elevated surface         General transfer comment: close min guard for rising from stretcher as pt limited by bil hip and knee pain. cues for safe technique with hand placement on RW. cue sfor reach back to sit. Pt "plopped" onto bed on first sit and in notable pain. pain subsided and pt able to complete second sit<>stand with emphasis placed on safe reach back with bil UE's to control lowering and prevent "plopping". pt successful on second attempt.  Ambulation/Gait Ambulation/Gait assistance: Min guard;Supervision Gait Distance (Feet): 80 Feet Assistive device: Rolling walker (2 wheeled) Gait Pattern/deviations: Step-to pattern;Decreased stance time - right;Decreased weight shift to right;Decreased stride length Gait velocity: decr   General Gait Details: cues for safe step pattern and proximity to RW and pt maintained throughout. min guard initially and pt progressed to supervision with guarding from pt's family per instruction from therapist. no overt LOB noted.  Stairs Stairs: Yes Stairs assistance: Min guard Stair Management: Two rails;Step to pattern;Forwards Number of Stairs: 3 General stair comments: VC's for step pattern, "up with strong, down with weak" and guarding for safety. pt verbalized safe guarding position for family to provide and able to recall at [place].  Wheelchair Mobility    Modified Rankin (Stroke Patients Only)       Balance Overall balance assessment: Needs assistance Sitting-balance support: Feet supported Sitting balance-Leahy Scale: Good     Standing balance support: During functional activity;Bilateral upper  extremity supported Standing balance-Leahy Scale: Poor Standing balance comment: reliant on external support of RW.                              Pertinent Vitals/Pain Pain Assessment: Faces Faces Pain Scale: Hurts whole lot Pain Location: Rt hip with bed mobility Pain Descriptors / Indicators: Burning;Moaning Pain Intervention(s): Limited activity within patient's tolerance;Monitored during session;Repositioned;Premedicated before session;Ice applied    Home Living Family/patient expects to be discharged to:: Private residence Living Arrangements: Non-relatives/Friends Available Help at Discharge: Friend(s) Type of Home: House Home Access: Stairs to enter Entrance Stairs-Rails: Can reach both Entrance Stairs-Number of Steps: 5 Home Layout: Two level;Bed/bath upstairs;Able to live on main level with bedroom/bathroom;Full bath on main level Home Equipment: Toilet riser Additional Comments: pt can sleep in a recliner, educated pt/family to raise height of chair with blankets in seat to make it taller due to pt's height.    Prior Function Level of Independence: Independent   Gait / Transfers Assistance Needed: pt reports he has been mobilizing without device but it has been extremely challenging           Hand Dominance   Dominant Hand: Left    Extremity/Trunk Assessment   Upper Extremity Assessment Upper Extremity Assessment: Overall WFL for tasks assessed    Lower Extremity Assessment Lower Extremity Assessment: RLE deficits/detail;LLE deficits/detail RLE Deficits / Details: pt unable to complet SLR due to surgical pain/weakness. ankle dorsi/plantar flexion 4-/5 RLE: Unable to fully assess due to pain LLE Deficits / Details: pt unable to complet SLR due to chronic pain/weakness. ankle dorsi/plantar flexion 4-/5 LLE: Unable to fully assess due to pain    Cervical / Trunk Assessment Cervical / Trunk Assessment: Normal  Communication   Communication: No difficulties  Cognition Arousal/Alertness: Awake/alert Behavior During Therapy: WFL for tasks assessed/performed Overall Cognitive Status:  Within Functional Limits for tasks assessed                                        General Comments      Exercises Total Joint Exercises Ankle Circles/Pumps: AROM;Both;15 reps;Supine Quad Sets: AROM;Right;5 reps;Supine Hip ABduction/ADduction: AROM;Right;Other (comment) (3)   Assessment/Plan    PT Assessment Patient needs continued PT services  PT Problem List Decreased strength;Decreased range of motion;Decreased activity tolerance;Decreased balance;Decreased mobility;Decreased knowledge of use of DME;Decreased safety awareness;Decreased knowledge of precautions;Pain;Obesity       PT Treatment Interventions DME instruction;Gait training;Stair training;Functional mobility training;Therapeutic activities;Balance training;Therapeutic exercise;Neuromuscular re-education;Patient/family education    PT Goals (Current goals can be found in the Care Plan section)  Acute Rehab PT Goals Patient Stated Goal: be able to take kids to the amusement park and go out and walk PT Goal Formulation: With patient Time For Goal Achievement: 01/17/21 Potential to Achieve Goals: Good    Frequency 7X/week   Barriers to discharge        Co-evaluation               AM-PAC PT "6 Clicks" Mobility  Outcome Measure Help needed turning from your back to your side while in a flat bed without using bedrails?: A Little Help needed moving from lying on your back to sitting on the side of a flat bed without using bedrails?: A Little Help needed moving to and from a bed to a chair (including a wheelchair)?: A  Little Help needed standing up from a chair using your arms (e.g., wheelchair or bedside chair)?: A Little Help needed to walk in hospital room?: A Little Help needed climbing 3-5 steps with a railing? : A Little 6 Click Score: 18    End of Session Equipment Utilized During Treatment: Gait belt Activity Tolerance: Patient tolerated treatment well;Patient limited by  pain Patient left: in bed;with call bell/phone within reach;with family/visitor present Nurse Communication: Mobility status PT Visit Diagnosis: Muscle weakness (generalized) (M62.81);Difficulty in walking, not elsewhere classified (R26.2);Pain Pain - Right/Left: Right Pain - part of body: Hip    Time: 9536-9223 PT Time Calculation (min) (ACUTE ONLY): 60 min   Charges:   PT Evaluation $PT Eval Low Complexity: 1 Low PT Treatments $Gait Training: 23-37 mins $Therapeutic Exercise: 8-22 mins        Verner Mould, DPT Acute Rehabilitation Services Office [phone] Pager [phone]    Jacques Navy 01/10/2021, 6:44 PM

## 2021-01-10 NOTE — H&P (Signed)
TOTAL HIP ADMISSION H&P  Patient is admitted for right total hip arthroplasty.  Subjective:  Chief Complaint: right hip pain  HPI: Charles Huang, 45 y.o. male, has a history of pain and functional disability in the right hip(s) due to arthritis and patient has failed non-surgical conservative treatments for greater than 12 weeks to include NSAID's and/or analgesics, flexibility and strengthening excercises, use of assistive devices, weight reduction as appropriate and activity modification.  Onset of symptoms was gradual starting 1 years ago with gradually worsening course since that time.The patient noted no past surgery on the right hip(s).  Patient currently rates pain in the right hip at 10 out of 10 with activity. Patient has night pain, worsening of pain with activity and weight bearing, pain that interfers with activities of daily living and pain with passive range of motion. Patient has evidence of subchondral cysts, subchondral sclerosis, periarticular osteophytes and joint space narrowing by imaging studies. This condition presents safety issues increasing the risk of falls.  There is no current active infection.  Patient Active Problem List   Diagnosis Date Noted  . Osteoarthritis of right hip 01/10/2021  . Obesity (BMI 30-39.9) 11/13/2020  . Annual physical exam 02/02/2020  . Erectile dysfunction 02/02/2020  . Hematuria 02/02/2020  . Evaluation by psychiatric service required 09/06/2019  . Chronic pain of both knees 02/18/2019  . Chronic pain syndrome 08/12/2018  . Vitamin D deficiency 08/12/2018  . Low back pain 02/09/2018  . Paroxysmal A-fib (HCC) 05/07/2017  . Prediabetes   . Suspected sleep apnea 04/10/2017  . Essential hypertension 04/28/2016  . Hyperlipidemia 04/28/2016  . Morbid obesity with BMI of 40.0-44.9, adult (HCC) 04/28/2016  . Primary osteoarthritis of both knees 04/28/2016   Past Medical History:  Diagnosis Date  . Arthritis    hands, neck, back, knees   .  Atrial fibrillation (HCC)   . Dysrhythmia    Afib  . Hyperlipidemia   . Hypertension   . Pre-diabetes   . Prediabetes     Past Surgical History:  Procedure Laterality Date  . NO PAST SURGERIES      Current Facility-Administered Medications  Medication Dose Route Frequency Provider Last Rate Last Admin  . 0.9 %  sodium chloride infusion   Intravenous Continuous Barrie Dunker B, PA      . acetaminophen (OFIRMEV) IV 1,000 mg  1,000 mg Intravenous Once McCauley, Larry B, [place]      . ceFAZolin (ANCEF) 3 g in dextrose 5 % 50 mL IVPB  3 g Intravenous On Call to OR Samson Frederic, MD      . lactated ringers infusion   Intravenous Continuous Marcene Duos, MD 10 mL/hr at 01/10/21 0844 New Bag at 01/10/21 0844  . povidone-iodine 10 % swab 2 application  2 application Topical Once Barrie Dunker B, [place]      . tranexamic acid (CYKLOKAPRON) IVPB 1,000 mg  1,000 mg Intravenous To OR Barrie Dunker B, PA       No Known Allergies  Social History   Tobacco Use  . Smoking status: Light Tobacco Smoker    Types: Cigarettes    Last attempt to quit: 05/29/2016    Years since quitting: 4.6  . Smokeless tobacco: Never Used  Substance Use Topics  . Alcohol use: Not Currently    Comment: socially    Family History  Problem Relation Age of Onset  . Arthritis Mother   . Hyperlipidemia Mother   . Stroke Mother   . Mental illness Neg  Hx      Review of Systems  Constitutional: Negative.   HENT: Negative.   Eyes: Negative.   Respiratory: Negative.   Cardiovascular: Negative.   Gastrointestinal: Negative.   Endocrine: Negative.   Genitourinary: Negative.   Musculoskeletal: Positive for arthralgias and back pain.  Allergic/Immunologic: Negative.   Neurological: Negative.   Hematological: Negative.   Psychiatric/Behavioral: Negative.     Objective:  Physical Exam Constitutional:      Appearance: Normal appearance. He is obese.  HENT:     Head: Normocephalic and atraumatic.      Nose: Nose normal.     Mouth/Throat:     Mouth: Mucous membranes are moist.     Pharynx: Oropharynx is clear.  Eyes:     Extraocular Movements: Extraocular movements intact.     Conjunctiva/sclera: Conjunctivae normal.     Pupils: Pupils are equal, round, and reactive to light.  Cardiovascular:     Rate and Rhythm: Normal rate and regular rhythm.     Pulses: Normal pulses.  Pulmonary:     Effort: Pulmonary effort is normal.  Abdominal:     General: Abdomen is flat.     Palpations: Abdomen is soft.  Genitourinary:    Comments: deferred Musculoskeletal:        General: Normal range of motion.     Cervical back: Normal range of motion.  Skin:    General: Skin is warm and dry.  Neurological:     General: No focal deficit present.     Mental Status: He is alert and oriented to person, place, and time.  Psychiatric:        Mood and Affect: Mood normal.        Behavior: Behavior normal.        Thought Content: Thought content normal.        Judgment: Judgment normal.     Vital signs in last 24 hours: Temp:  [98.6 F (37 C)] 98.6 F (37 C) (01/27 0827) Pulse Rate:  [78] 78 (01/27 0827) Resp:  [18] 18 (01/27 0827) BP: (152)/(93) 152/93 (01/27 0827) SpO2:  [98 %] 98 % (01/27 0827) Weight:  [139.7 kg] 139.7 kg (01/27 0817)  Labs:   Estimated body mass index is 36.52 kg/m as calculated from the following:   Height as of this encounter: 6\' 5"  (1.956 m).   Weight as of this encounter: 139.7 kg.   Imaging Review Plain radiographs demonstrate severe degenerative joint disease of the right hip(s). The bone quality appears to be adequate for age and reported activity level.      Assessment/Plan:  End stage arthritis, right hip(s)  The patient history, physical examination, clinical judgement of the provider and imaging studies are consistent with end stage degenerative joint disease of the right hip(s) and total hip arthroplasty is deemed medically necessary. The  treatment options including medical management, injection therapy, arthroscopy and arthroplasty were discussed at length. The risks and benefits of total hip arthroplasty were presented and reviewed. The risks due to aseptic loosening, infection, stiffness, dislocation/subluxation,  thromboembolic complications and other imponderables were discussed.  The patient acknowledged the explanation, agreed to proceed with the plan and consent was signed. Patient is being admitted for inpatient treatment for surgery, pain control, PT, OT, prophylactic antibiotics, VTE prophylaxis, progressive ambulation and ADL's and discharge planning.The patient is planning to be discharged home with HEP    Patient's anticipated LOS is less than 2 midnights, meeting these requirements: - Younger than 67 - Lives within  1 hour of care - Has a competent adult at home to recover with post-op recover - NO history of  - Chronic pain requiring opiods  - Diabetes  - Coronary Artery Disease  - Heart failure  - Heart attack  - Stroke  - DVT/VTE  - Cardiac arrhythmia  - Respiratory Failure/COPD  - Renal failure  - Anemia  - Advanced Liver disease

## 2021-01-10 NOTE — Anesthesia Procedure Notes (Signed)
Procedure Name: MAC Date/Time: 01/10/2021 10:40 AM Performed by: Niel Hummer, CRNA Pre-anesthesia Checklist: Patient identified, Emergency Drugs available, Suction available and Patient being monitored Oxygen Delivery Method: Simple face mask

## 2021-01-10 NOTE — TOC Transition Note (Signed)
Transition of Care [facility]) - CM/SW Discharge Note   Patient Details  Name: Charles Huang MRN: [id number] Date of Birth: 1976/01/05  Transition of Care [facility] CM/SW Contact:  Clearance Coots, LCSW Phone Number: 01/10/2021, 5:40 PM   Clinical Narrative:    RW ordered through [employer] [facility]. RW provided onsite.    Final next level of care: Home/Self Care Barriers to Discharge: No Barriers Identified   Patient Goals and CMS Choice        Discharge Placement                       Discharge Plan and Services                DME Arranged: Walker rolling DME Agency: [employer] Date DME Agency Contacted: 01/10/21 Time DME Agency Contacted: 1739 Representative spoke with at DME Agency: Velna Hatchet            Social Determinants of Health (SDOH) Interventions     Readmission Risk Interventions No flowsheet data found.

## 2021-01-10 NOTE — Anesthesia Procedure Notes (Signed)
Spinal  Patient location during procedure: OR Start time: 01/10/2021 10:48 AM End time: 01/10/2021 10:56 AM Staffing Anesthesiologist: Trevor Iha, MD Preanesthetic Checklist Completed: patient identified, IV checked, site marked, risks and benefits discussed, surgical consent, monitors and equipment checked, pre-op evaluation and timeout performed Spinal Block Patient position: sitting Prep: DuraPrep Patient monitoring: heart rate, cardiac monitor, continuous pulse ox and blood pressure Approach: midline Location: L3-4 Injection technique: single-shot Needle Needle type: Quincke  Needle gauge: 22 G Needle length: 9 cm Needle insertion depth: 8 cm Assessment Sensory level: T4 Additional Notes 2 attempts w 24 G Pencan unsuccesful. 1 Attempt w 22g Lucia Gaskins

## 2021-01-10 NOTE — Op Note (Signed)
OPERATIVE REPORT  SURGEON: Rod Can, MD   ASSISTANT: Cherlynn June, PA-C.  PREOPERATIVE DIAGNOSIS: Right hip arthritis.   POSTOPERATIVE DIAGNOSIS: Right hip arthritis.   PROCEDURE: Right total hip arthroplasty, anterior approach.   IMPLANTS: DePuy Tri Lock stem, size 4, hi offset. DePuy Pinnacle Cup, size 56 mm. DePuy Altrx liner, size 56 by 36 mm, neutral. DePuy Biolox ceramic head ball, size 36 + 5 mm.  ANESTHESIA:  MAC and Spinal  ESTIMATED BLOOD LOSS:-450 mL    ANTIBIOTICS: 3g Ancef.  DRAINS: None.  COMPLICATIONS: None.   CONDITION: PACU - hemodynamically stable.   BRIEF CLINICAL NOTE: Charles Huang is a 45 y.o. male with a long-standing history of Right hip arthritis. After failing conservative management, the patient was indicated for total hip arthroplasty. The risks, benefits, and alternatives to the procedure were explained, and the patient elected to proceed.  PROCEDURE IN DETAIL: Surgical site was marked by myself in the pre-op holding area. Once inside the operating room, spinal anesthesia was obtained, and a foley catheter was inserted. The patient was then positioned on the Hana table.  All bony prominences were well padded.  The hip was prepped and draped in the normal sterile surgical fashion.  A time-out was called verifying side and site of surgery. The patient received IV antibiotics within 60 minutes of beginning the procedure.   The direct anterior approach to the hip was performed through the Hueter interval.  Lateral femoral circumflex vessels were treated with the Auqumantys. The anterior capsule was exposed and an inverted T capsulotomy was made. The femoral neck cut was made to the level of the templated cut.  A corkscrew was placed into the head and the head was removed.  The femoral head was found to have eburnated bone. The head was passed to the back table and was measured.   Acetabular exposure was achieved, and the pulvinar and labrum were  excised. Sequential reaming of the acetabulum was then performed up to a size 55 mm reamer. A 56 mm cup was then opened and impacted into place at approximately 40 degrees of abduction and 20 degrees of anteversion. The final polyethylene liner was impacted into place and acetabular osteophytes were removed.    I then gained femoral exposure taking care to protect the abductors and greater trochanter.  This was performed using standard external rotation, extension, and adduction.  The capsule was peeled off the inner aspect of the greater trochanter, taking care to preserve the short external rotators. A cookie cutter was used to enter the femoral canal, and then the femoral canal finder was placed.  Sequential broaching was performed up to a size 4.  Calcar planer was used on the femoral neck remnant.  I placed a hi offset neck and a trial head ball.  The hip was reduced.  Leg lengths and offset were checked fluoroscopically.  The hip was dislocated and trial components were removed.  The final implants were placed, and the hip was reduced.  Fluoroscopy was used to confirm component position and leg lengths.  At 90 degrees of external rotation and full extension, the hip was stable to an anterior directed force.   The wound was copiously irrigated with Irrisept solution and normal saline using pule lavage.  Marcaine solution was injected into the periarticular soft tissue.  The wound was closed in layers using #1 Stratafix for the fascia, 2-0 Vicryl for the subcutaneous fat, 2-0 Monocryl for the deep dermal layer, 3-0 running Monocryl subcuticular stitch, and  Dermabond for the skin.  Once the glue was fully dried, an Aquacell Ag dressing was applied.  The patient was transported to the recovery room in stable condition.  Sponge, needle, and instrument counts were correct at the end of the case x2.  The patient tolerated the procedure well and there were no known complications.  Please note that a surgical  assistant was a medical necessity for this procedure to perform it in a safe and expeditious manner. Assistant was necessary to provide appropriate retraction of vital neurovascular structures, to prevent femoral fracture, and to allow for anatomic placement of the prosthesis.

## 2021-01-10 NOTE — Discharge Instructions (Signed)
°Dr. Caffie Sotto °Joint Replacement Specialist °[facility] °[address] °[place], [place] [address] °[phone] ° ° °TOTAL HIP REPLACEMENT POSTOPERATIVE DIRECTIONS ° ° ° °Hip Rehabilitation, Guidelines Following Surgery  ° °WEIGHT BEARING °Weight bearing as tolerated with assist device (walker, cane, etc) as directed, use it as long as suggested by your surgeon or therapist, typically at least 4-6 weeks. ° °The results of a hip operation are greatly improved after range of motion and muscle strengthening exercises. Follow all safety measures which are given to protect your hip. If any of these exercises cause increased pain or swelling in your joint, decrease the amount until you are comfortable again. Then slowly increase the exercises. Call your caregiver if you have problems or questions.  ° °HOME CARE INSTRUCTIONS  °Most of the following instructions are designed to prevent the dislocation of your new hip.  °Remove items at home which could result in a fall. This includes throw rugs or furniture in walking pathways.  °Continue medications as instructed at time of discharge. °· You may have some home medications which will be placed on hold until you complete the course of blood thinner medication. °· You may start showering once you are discharged home. Do not remove your dressing. °Do not put on socks or shoes without following the instructions of your caregivers.   °Sit on chairs with arms. Use the chair arms to help push yourself up when arising.  °Arrange for the use of a toilet seat elevator so you are not sitting low.  °· Walk with walker as instructed.  °You may resume a sexual relationship in one month or when given the OK by your caregiver.  °Use walker as long as suggested by your caregivers.  °You may put full weight on your legs and walk as much as is comfortable. °Avoid periods of inactivity such as sitting longer than an hour when not asleep. This helps prevent  blood clots.  °You may return to work once you are cleared by your surgeon.  °Do not drive a car for 6 weeks or until released by your surgeon.  °Do not drive while taking narcotics.  °Wear elastic stockings for two weeks following surgery during the day but you may remove then at night.  °Make sure you keep all of your appointments after your operation with all of your doctors and caregivers. You should call the office at the above phone number and make an appointment for approximately two weeks after the date of your surgery. °Please pick up a stool softener and laxative for home use as long as you are requiring pain medications. °· ICE to the affected hip every three hours for 30 minutes at a time and then as needed for pain and swelling. Continue to use ice on the hip for pain and swelling from surgery. You may notice swelling that will progress down to the foot and ankle.  This is normal after surgery.  Elevate the leg when you are not up walking on it.   °It is important for you to complete the blood thinner medication as prescribed by your doctor. °· Continue to use the breathing machine which will help keep your temperature down.  It is common for your temperature to cycle up and down following surgery, especially at night when you are not up moving around and exerting yourself.  The breathing machine keeps your lungs expanded and your temperature down. ° °RANGE OF MOTION AND STRENGTHENING EXERCISES  °These exercises are   designed to help you keep full movement of your hip joint. Follow your caregiver's or physical therapist's instructions. Perform all exercises about fifteen times, three times per day or as directed. Exercise both hips, even if you have had only one joint replacement. These exercises can be done on a training (exercise) mat, on the floor, on a table or on a bed. Use whatever works the best and is most comfortable for you. Use music or television while you are exercising so that the exercises  are a pleasant break in your day. This will make your life better with the exercises acting as a break in routine you can look forward to.  °Lying on your back, slowly slide your foot toward your buttocks, raising your knee up off the floor. Then slowly slide your foot back down until your leg is straight again.  °Lying on your back spread your legs as far apart as you can without causing discomfort.  °Lying on your side, raise your upper leg and foot straight up from the floor as far as is comfortable. Slowly lower the leg and repeat.  °Lying on your back, tighten up the muscle in the front of your thigh (quadriceps muscles). You can do this by keeping your leg straight and trying to raise your heel off the floor. This helps strengthen the largest muscle supporting your knee.  °Lying on your back, tighten up the muscles of your buttocks both with the legs straight and with the knee bent at a comfortable angle while keeping your heel on the floor.  ° °SKILLED REHAB INSTRUCTIONS: °If the patient is transferred to a skilled rehab facility following release from the hospital, a list of the current medications will be sent to the facility for the patient to continue.  When discharged from the skilled rehab facility, please have the facility set up the patient's Home Health Physical Therapy prior to being released. Also, the skilled facility will be responsible for providing the patient with their medications at time of release from the facility to include their pain medication and their blood thinner medication. If the patient is still at the rehab facility at time of the two week follow up appointment, the skilled rehab facility will also need to assist the patient in arranging follow up appointment in our office and any transportation needs. ° °MAKE SURE YOU:  °Understand these instructions.  °Will watch your condition.  °Will get help right away if you are not doing well or get worse. ° °Pick up stool softner and  laxative for home use following surgery while on pain medications. °Do not remove your dressing. °The dressing is waterproof--it is OK to take showers. °Continue to use ice for pain and swelling after surgery. °Do not use any lotions or creams on the incision until instructed by your surgeon. °Total Hip Protocol. ° ° °

## 2021-01-10 NOTE — Transfer of Care (Signed)
Immediate Anesthesia Transfer of Care Note  Patient: Charles Huang  Procedure(s) Performed: TOTAL HIP ARTHROPLASTY ANTERIOR APPROACH (Right Hip)  Patient Location: PACU  Anesthesia Type:Spinal  Level of Consciousness: awake, alert  and oriented  Airway & Oxygen Therapy: Patient Spontanous Breathing and Patient connected to face mask oxygen  Post-op Assessment: Report given to RN and Post -op Vital signs reviewed and stable  Post vital signs: Reviewed and stable  Last Vitals:  Vitals Value Taken Time  BP    Temp    Pulse 96 01/10/21 1328  Resp 15 01/10/21 1328  SpO2 100 % 01/10/21 1328  Vitals shown include unvalidated device data.  Last Pain:  Vitals:   01/10/21 0832  TempSrc:   PainSc: 6       Patients Stated Pain Goal: 5 (01/10/21 9924)  Complications: No complications documented.

## 2021-01-11 ENCOUNTER — Encounter: Payer: Self-pay | Admitting: Internal Medicine

## 2021-01-11 ENCOUNTER — Telehealth: Payer: Self-pay | Admitting: Internal Medicine

## 2021-01-11 ENCOUNTER — Encounter (HOSPITAL_COMMUNITY): Payer: Self-pay | Admitting: Orthopedic Surgery

## 2021-01-11 NOTE — Telephone Encounter (Signed)
Please mail handicap form for patient  Sent my chart for surgery to fill out but we can for temp 6 months and further after this surgery to fill out call patient to inform  Will need to take to [facility] or license tag DMV I believe

## 2021-01-14 NOTE — Telephone Encounter (Signed)
Mailed to patient.  Patient informed via mychart

## 2021-01-30 ENCOUNTER — Telehealth: Payer: Self-pay

## 2021-01-30 NOTE — Telephone Encounter (Signed)
Faxed signed preop risk eval and office notes to [facility] [phone] on 01/30/21

## 2021-03-12 ENCOUNTER — Telehealth: Payer: Self-pay | Admitting: *Deleted

## 2021-03-12 ENCOUNTER — Other Ambulatory Visit: Payer: Self-pay | Admitting: Internal Medicine

## 2021-03-12 DIAGNOSIS — R799 Abnormal finding of blood chemistry, unspecified: Secondary | ICD-10-CM

## 2021-03-12 DIAGNOSIS — I10 Essential (primary) hypertension: Secondary | ICD-10-CM

## 2021-03-12 DIAGNOSIS — R7303 Prediabetes: Secondary | ICD-10-CM

## 2021-03-12 NOTE — Telephone Encounter (Signed)
Please place future orders for lab appt.  

## 2021-03-14 ENCOUNTER — Other Ambulatory Visit: Payer: Managed Care, Other (non HMO)

## 2021-04-05 ENCOUNTER — Other Ambulatory Visit: Payer: Self-pay

## 2021-04-05 ENCOUNTER — Other Ambulatory Visit (INDEPENDENT_AMBULATORY_CARE_PROVIDER_SITE_OTHER): Payer: Managed Care, Other (non HMO)

## 2021-04-05 DIAGNOSIS — R7303 Prediabetes: Secondary | ICD-10-CM | POA: Diagnosis not present

## 2021-04-05 DIAGNOSIS — I10 Essential (primary) hypertension: Secondary | ICD-10-CM

## 2021-04-05 DIAGNOSIS — R799 Abnormal finding of blood chemistry, unspecified: Secondary | ICD-10-CM

## 2021-04-05 LAB — BASIC METABOLIC PANEL
BUN: 19 mg/dL (ref 6–23)
CO2: 30 mEq/L (ref 19–32)
Calcium: 9.4 mg/dL (ref 8.4–10.5)
Chloride: 102 mEq/L (ref 96–112)
Creatinine, Ser: 1 mg/dL (ref 0.40–1.50)
GFR: 91.42 mL/min (ref >60.00)
Glucose, Bld: 74 mg/dL (ref 70–99)
Potassium: 4.1 mEq/L (ref 3.5–5.1)
Sodium: 141 mEq/L (ref 135–145)

## 2021-04-05 LAB — CBC WITH DIFFERENTIAL/PLATELET
Basophils Absolute: 0 10*3/uL (ref 0.0–0.1)
Basophils Relative: 0.6 % (ref 0.0–3.0)
Eosinophils Absolute: 0.1 10*3/uL (ref 0.0–0.7)
Eosinophils Relative: 0.8 % (ref 0.0–5.0)
HCT: 40.4 % (ref 39.0–52.0)
Hemoglobin: 13 g/dL (ref 13.0–17.0)
Lymphocytes Relative: 35.3 % (ref 12.0–46.0)
Lymphs Abs: 2.6 10*3/uL (ref 0.7–4.0)
MCHC: 32.2 g/dL (ref 30.0–36.0)
MCV: 83.7 fl (ref 78.0–100.0)
Monocytes Absolute: 0.5 10*3/uL (ref 0.1–1.0)
Monocytes Relative: 7.5 % (ref 3.0–12.0)
Neutro Abs: 4 10*3/uL (ref 1.4–7.7)
Neutrophils Relative %: 55.8 % (ref 43.0–77.0)
Platelets: 243 10*3/uL (ref 150.0–400.0)
RBC: 4.83 Mil/uL (ref 4.22–5.81)
RDW: 17 % — ABNORMAL HIGH (ref 11.5–15.5)
WBC: 7.2 10*3/uL (ref 4.0–10.5)

## 2021-04-05 LAB — LIPID PANEL
Cholesterol: 190 mg/dL (ref 0–200)
HDL: 50.6 mg/dL (ref >39.00)
LDL Cholesterol: 123 mg/dL — ABNORMAL HIGH (ref 0–99)
NonHDL: 138.99
Total CHOL/HDL Ratio: 4
Triglycerides: 81 mg/dL (ref 0.0–149.0)
VLDL: 16.2 mg/dL (ref 0.0–40.0)

## 2021-04-05 LAB — HEMOGLOBIN A1C: Hgb A1c MFr Bld: 5.6 % (ref 4.6–6.5)

## 2021-04-07 ENCOUNTER — Other Ambulatory Visit: Payer: Self-pay | Admitting: Internal Medicine

## 2021-04-07 DIAGNOSIS — E559 Vitamin D deficiency, unspecified: Secondary | ICD-10-CM

## 2021-06-03 IMAGING — MR MR HIP*L* W/O CM
5 series · 40 of 40 positions shown · non-contrast
Comparison: None.

CLINICAL DATA: Severe bilateral hip pain for several weeks. No
known injury.

EXAM:
MR OF THE RIGHT HIP WITHOUT CONTRAST
TECHNIQUE: Multiplanar, multisequence MR imaging was performed. No intravenous
contrast was administered.

[Series 7: T1 · coronal · B · 4.0mm · 1.19mm/px · 9 of 40 slices shown]
[im 1/40]
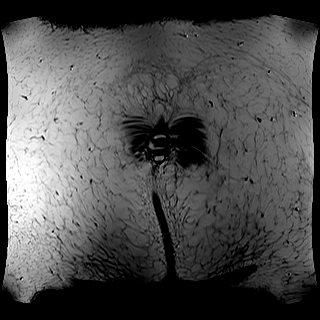
[im 5/40]
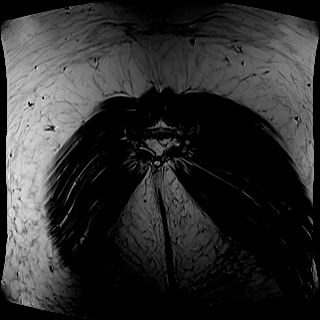
[im 10/40]
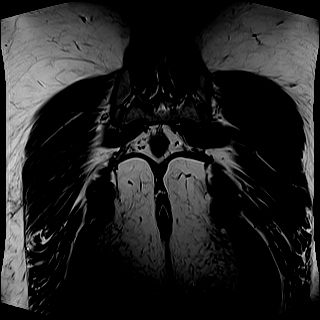
[im 15/40]
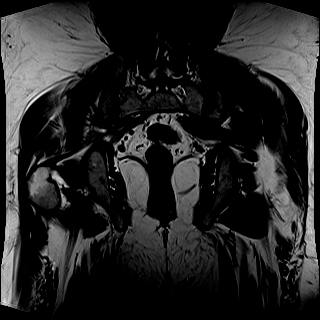
[im 20/40]
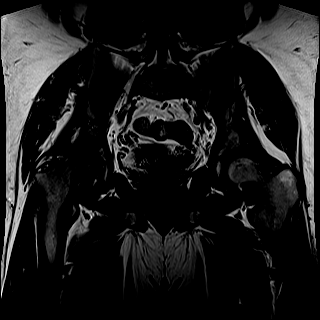
[im 25/40]
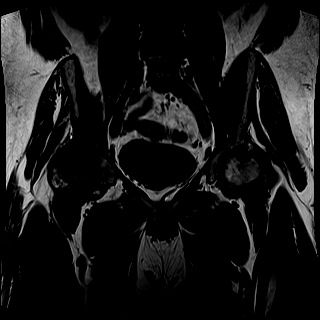
[im 30/40]
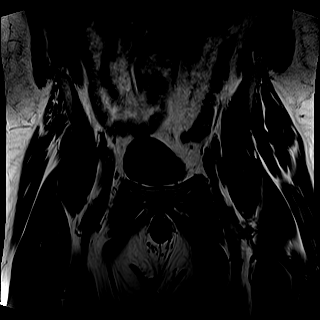
[im 35/40]
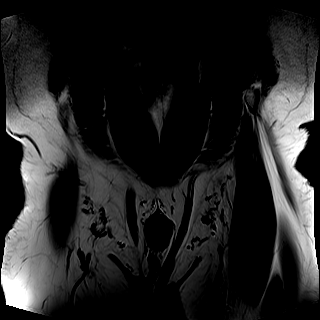
[im 40/40]
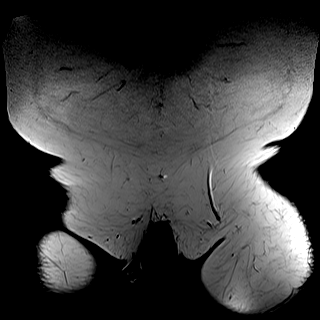

[Series 8: STIR · coronal · B · 4.0mm · 1.19mm/px · 10 of 40 slices shown]
[im 1/40]
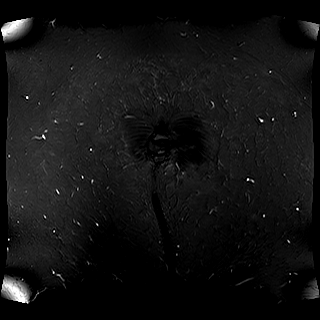
[im 5/40]
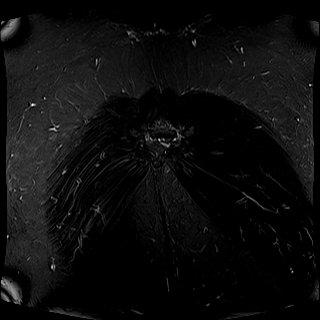
[im 9/40]
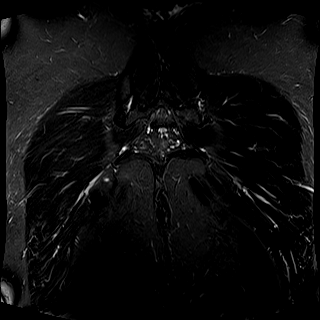
[im 14/40]
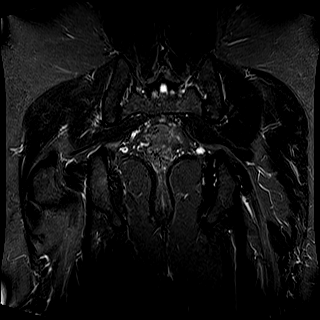
[im 18/40]
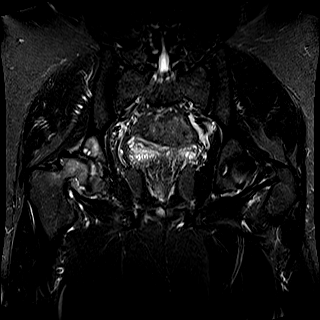
[im 22/40]
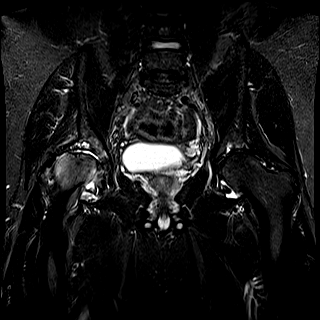
[im 27/40]
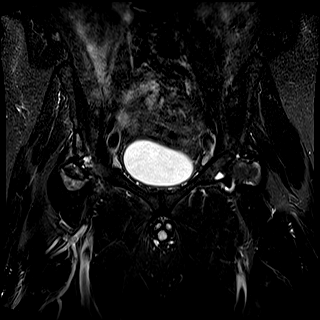
[im 31/40]
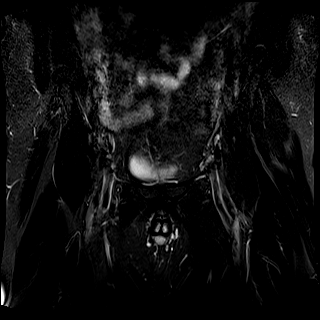
[im 35/40]
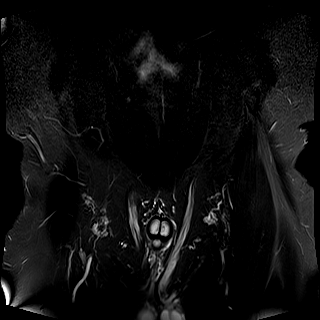
[im 40/40]
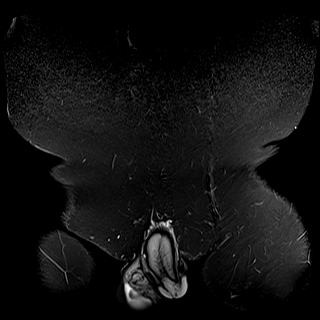

[Series 9: T2 fat-sat · axial · B · 4.0mm · 0.70mm/px · z∈[+63,+208]mm · 7 of 30 slices shown]
[im 1/30]
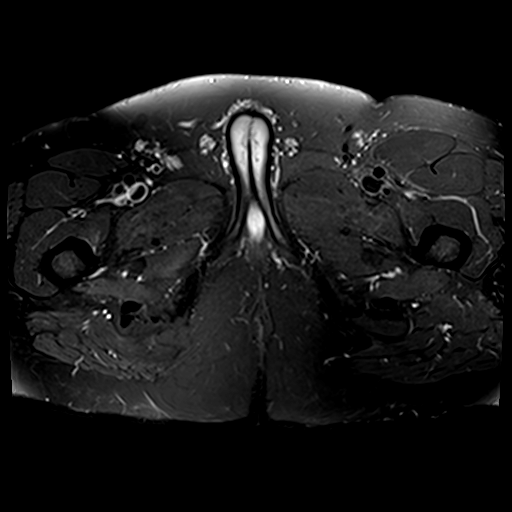
[im 5/30]
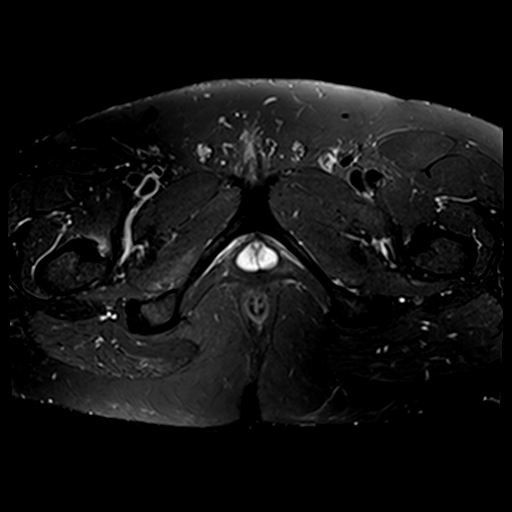
[im 10/30]
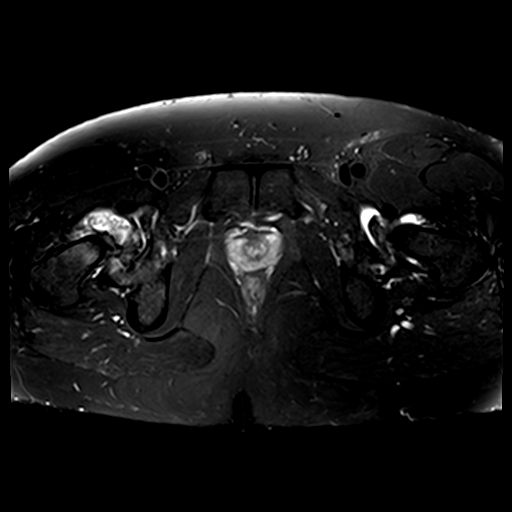
[im 15/30]
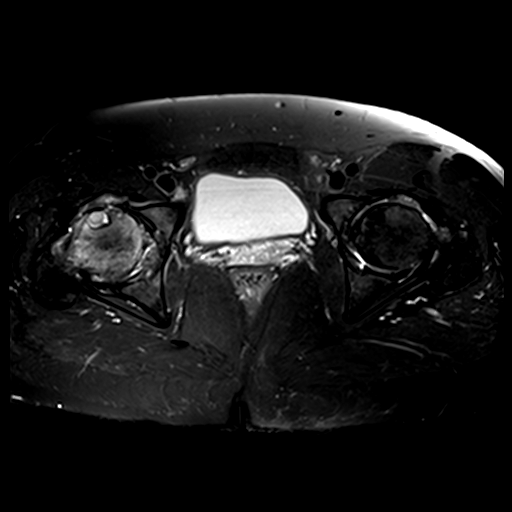
[im 20/30]
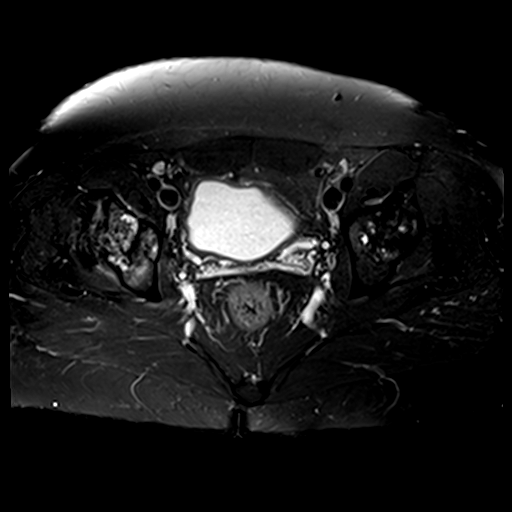
[im 25/30]
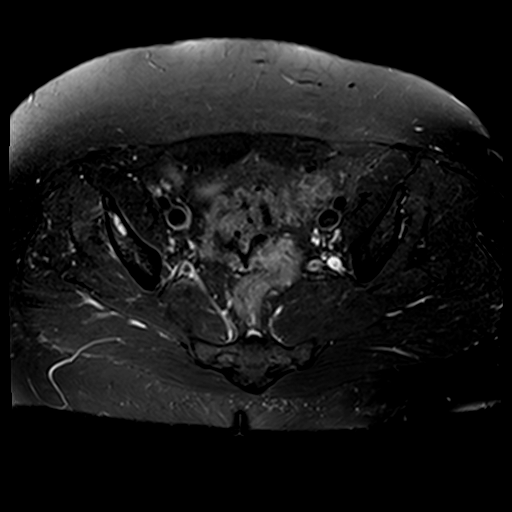
[im 30/30]
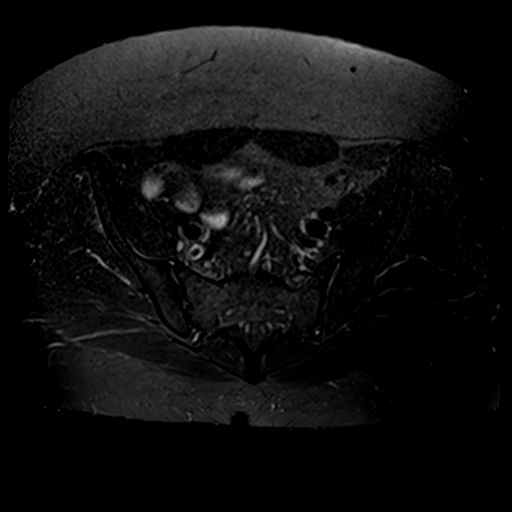

[Series 10: PD fat-sat · sagittal · B · 4.0mm · 0.70mm/px · 7 of 29 slices shown (1 of 2)]
[im 1/29]
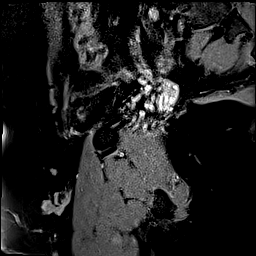
[im 5/29]
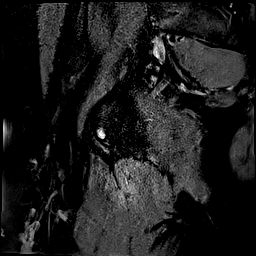
[im 10/29]
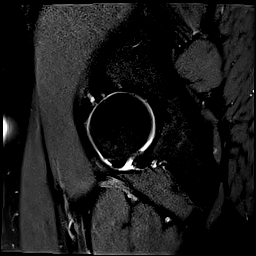
[im 15/29]
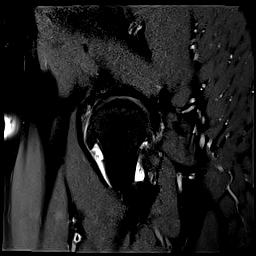
[im 19/29]
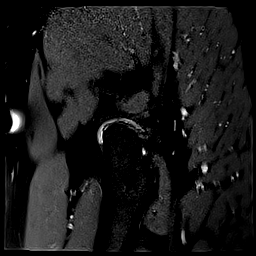
[im 24/29]
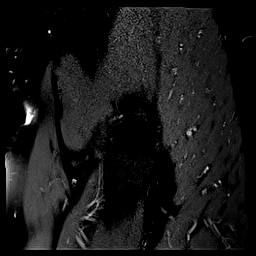
[im 29/29]
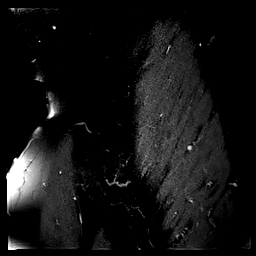

[Series 11: PD fat-sat · coronal · B · 4.0mm · 0.70mm/px · 7 of 29 slices shown (2 of 2)]
[im 1/29]
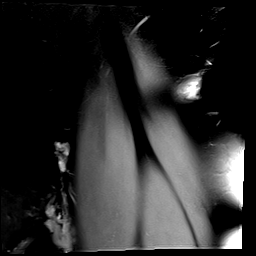
[im 5/29]
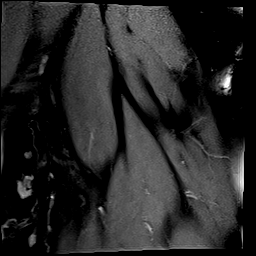
[im 10/29]
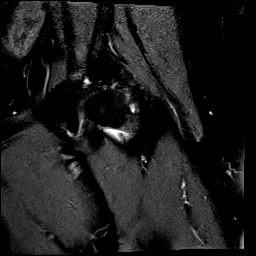
[im 15/29]
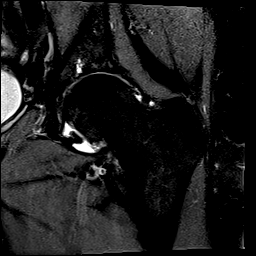
[im 19/29]
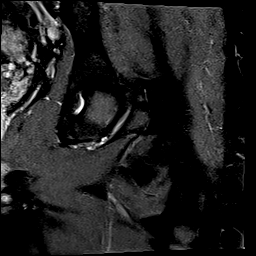
[im 24/29]
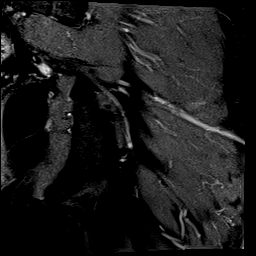
[im 29/29]
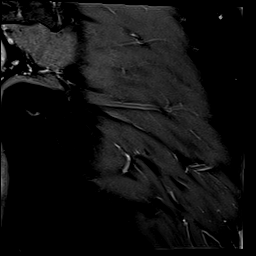

[40 of 40 positions shown; findings below may reference images not displayed]

FINDINGS: Bones: There is no acute bony or joint abnormality. The patient has
advanced bilateral hip osteoarthritis. Degenerative change is worse
on the right where there is extensive subchondral cyst formation,
flattening and remodeling of the superior aspect of the femoral head
and edema about the joint. Bulky osteophytes are present. No
avascular necrosis of the femoral heads. No worrisome bony lesion.

Articular cartilage and labrum

Articular cartilage: Severely degenerated bilaterally with
bone-on-bone joint space narrowing, somewhat worse on the right.

Labrum:  Severely degenerated bilaterally.

Joint or bursal effusion

Joint effusion:  None.

Bursae: Negative.

Muscles and tendons

Muscles and tendons:  Intact.

Other findings

Miscellaneous:   None.
IMPRESSION: Severe bilateral hip osteoarthritis is worse on the right. The exam
is otherwise negative.

## 2021-06-11 ENCOUNTER — Other Ambulatory Visit: Payer: Self-pay | Admitting: Internal Medicine

## 2021-06-11 DIAGNOSIS — M25561 Pain in right knee: Secondary | ICD-10-CM

## 2021-06-11 DIAGNOSIS — G8929 Other chronic pain: Secondary | ICD-10-CM

## 2021-06-24 ENCOUNTER — Encounter: Payer: Self-pay | Admitting: Internal Medicine

## 2021-06-25 NOTE — Telephone Encounter (Signed)
Please advise, Patient last seen 11/13/20

## 2021-08-15 ENCOUNTER — Telehealth: Payer: Self-pay

## 2021-08-15 NOTE — Telephone Encounter (Signed)
[facility] prescriber response form has been signed, confirmed faxed, and sent to scan.

## 2021-09-01 ENCOUNTER — Other Ambulatory Visit: Payer: Self-pay

## 2021-09-01 ENCOUNTER — Emergency Department: Payer: Managed Care, Other (non HMO)

## 2021-09-01 ENCOUNTER — Emergency Department
Admission: EM | Admit: 2021-09-01 | Discharge: 2021-09-01 | Disposition: A | Discharge status: Home / Self Care | Payer: Managed Care, Other (non HMO) | Attending: Student in an Organized Health Care Education/Training Program | Admitting: Student in an Organized Health Care Education/Training Program

## 2021-09-01 DIAGNOSIS — M25561 Pain in right knee: Secondary | ICD-10-CM | POA: Diagnosis not present

## 2021-09-01 DIAGNOSIS — S86911A Strain of unspecified muscle(s) and tendon(s) at lower leg level, right leg, initial encounter: Secondary | ICD-10-CM | POA: Diagnosis not present

## 2021-09-01 DIAGNOSIS — X501XXA Overexertion from prolonged static or awkward postures, initial encounter: Secondary | ICD-10-CM | POA: Diagnosis not present

## 2021-09-01 DIAGNOSIS — Z79899 Other long term (current) drug therapy: Secondary | ICD-10-CM | POA: Insufficient documentation

## 2021-09-01 DIAGNOSIS — F17210 Nicotine dependence, cigarettes, uncomplicated: Secondary | ICD-10-CM | POA: Insufficient documentation

## 2021-09-01 DIAGNOSIS — M1711 Unilateral primary osteoarthritis, right knee: Secondary | ICD-10-CM

## 2021-09-01 DIAGNOSIS — Z96641 Presence of right artificial hip joint: Secondary | ICD-10-CM | POA: Insufficient documentation

## 2021-09-01 DIAGNOSIS — I10 Essential (primary) hypertension: Secondary | ICD-10-CM | POA: Insufficient documentation

## 2021-09-01 DIAGNOSIS — S80911A Unspecified superficial injury of right knee, initial encounter: Secondary | ICD-10-CM | POA: Diagnosis present

## 2021-09-01 MED ORDER — CYCLOBENZAPRINE HCL 5 MG PO TABS: 5.0000 mg | ORAL_TABLET | Freq: Three times a day (TID) | ORAL | 0 refills | Status: DC | PRN

## 2021-09-01 MED ORDER — NABUMETONE 750 MG PO TABS: 750.0000 mg | ORAL_TABLET | Freq: Two times a day (BID) | ORAL | 0 refills | Status: AC

## 2021-09-01 MED ORDER — HYDROCODONE-ACETAMINOPHEN 5-325 MG PO TABS: 1.0000 | ORAL_TABLET | Freq: Three times a day (TID) | ORAL | 0 refills | Status: DC | PRN

## 2021-09-01 NOTE — Discharge Instructions (Addendum)
Use the knee immobilizer as needed to ambulate with weightbearing as tolerated.  Take the prescription meds as prescribed.  Follow-up with your Ortho provider for ongoing management of your symptoms.

## 2021-09-01 NOTE — ED Notes (Signed)
Says no injury to right knee, but it went out.  Says this has been coming on for  while.  In nad. Brought to rom via wheelchair.  Has cane.

## 2021-09-01 NOTE — ED Provider Notes (Signed)
[facility] Emergency Department Provider Note ____________________________________________  Time seen: 1730  I have reviewed the triage vital signs and the nursing notes.  HISTORY  Chief Complaint  Knee Pain   HPI Charles Huang is a 45 y.o. male resents insulted ED for evaluation of acute right knee pain after injury.  Patient with the below medical history, presents with a knee brace in place, with complaints of popping to the knee after he attempted to change direction too quickly.  Patient reports the incident occurred about 3-4 subsequent times.  He presents now with a knee brace from home for evaluation of his complaints.  He denies any other injury at this time.  Past Medical History:  Diagnosis Date   Arthritis    hands, neck, back, knees    Atrial fibrillation (HCC)    Dysrhythmia    Afib   Hyperlipidemia    Hypertension    Pre-diabetes    Prediabetes     Patient Active Problem List   Diagnosis Date Noted   Osteoarthritis of right hip 01/10/2021   Obesity (BMI 30-39.9) 11/13/2020   Annual physical exam 02/02/2020   Erectile dysfunction 02/02/2020   Hematuria 02/02/2020   Evaluation by psychiatric service required 09/06/2019   Chronic pain of both knees 02/18/2019   Chronic pain syndrome 08/12/2018   Vitamin D deficiency 08/12/2018   Low back pain 02/09/2018   Paroxysmal A-fib (HCC) 05/07/2017   Prediabetes    Suspected sleep apnea 04/10/2017   Essential hypertension 04/28/2016   Hyperlipidemia 04/28/2016   Morbid obesity with BMI of 40.0-44.9, adult (HCC) 04/28/2016   Primary osteoarthritis of both knees 04/28/2016    Past Surgical History:  Procedure Laterality Date   NO PAST SURGERIES     TOTAL HIP ARTHROPLASTY Right 01/10/2021   Procedure: TOTAL HIP ARTHROPLASTY ANTERIOR APPROACH;  Surgeon: Samson Frederic, MD;  Location: [facility];  Service: Orthopedics;  Laterality: Right;    Prior to Admission medications   Medication Sig Start  Date End Date Taking? Authorizing Provider  cyclobenzaprine (FLEXERIL) 5 MG tablet Take 1 tablet (5 mg total) by mouth 3 (three) times daily as needed. 09/01/21  Yes Terryann Verbeek, Charlesetta Ivory, PA-C  HYDROcodone-acetaminophen (NORCO) 5-325 MG tablet Take 1 tablet by mouth 3 (three) times daily as needed. 09/01/21  Yes Sharmane Dame, Charlesetta Ivory, PA-C  nabumetone (RELAFEN) 750 MG tablet Take 1 tablet (750 mg total) by mouth 2 (two) times daily for 15 days. 09/01/21 09/16/21 Yes Dorothey Oetken, Charlesetta Ivory, PA-C  amLODipine (NORVASC) 5 MG tablet Take 1 tablet (5 mg total) by mouth daily. 11/13/20   McLean-Scocuzza, Pasty Spillers, MD  Cholecalciferol 1.25 MG (50000 UT) capsule Take 1 capsule (50,000 Units total) by mouth once a week. Patient not taking: No sig reported 11/13/20   McLean-Scocuzza, Pasty Spillers, MD  diclofenac (VOLTAREN) 75 MG EC tablet TAKE 1 TABLET BY MOUTH 2 TIMES DAILY AFTER A MEAL. AS NEEDED 06/11/21   McLean-Scocuzza, Pasty Spillers, MD  losartan-hydrochlorothiazide (HYZAAR) 100-25 MG tablet Take 1 tablet by mouth daily. In am Patient taking differently: Take 1 tablet by mouth daily. 11/13/20   McLean-Scocuzza, Pasty Spillers, MD  ondansetron (ZOFRAN) 4 MG tablet Take 1 tablet (4 mg total) by mouth every 8 (eight) hours as needed for nausea or vomiting. 01/10/21   Samson Frederic, MD    Allergies Patient has no known allergies.  Family History  Problem Relation Age of Onset   Arthritis Mother    Hyperlipidemia Mother  Stroke Mother    Mental illness Neg Hx     Social History Social History   Tobacco Use   Smoking status: Light Smoker    Types: Cigarettes    Last attempt to quit: 05/29/2016    Years since quitting: 5.2   Smokeless tobacco: Never  Vaping Use   Vaping Use: Never used  Substance Use Topics   Alcohol use: Not Currently    Comment: socially   Drug use: No    Review of Systems  Constitutional: Negative for fever. Cardiovascular: Negative for chest pain. Respiratory: Negative for  shortness of breath. Gastrointestinal: Negative for abdominal pain, vomiting and diarrhea. Genitourinary: Negative for dysuria. Musculoskeletal: Negative for back pain.  Right knee pain as above. Skin: Negative for rash. Neurological: Negative for headaches, focal weakness or numbness. ____________________________________________  PHYSICAL EXAM:  VITAL SIGNS: ED Triage Vitals  Enc Vitals Group     BP 09/01/21 1436 (!) 133/96     Pulse Rate 09/01/21 1436 (!) 105     Resp 09/01/21 1436 18     Temp 09/01/21 1436 98.4 F (36.9 C)     Temp Source 09/01/21 1436 Oral     SpO2 09/01/21 1436 97 %     Weight 09/01/21 1437 (!) 325 lb (147.4 kg)     Height 09/01/21 1437 6\' 4"  (1.93 m)     Head Circumference --      Peak Flow --      Pain Score 09/01/21 1437 2     Pain Loc --      Pain Edu? --      Excl. in GC? --     Constitutional: Alert and oriented. Well appearing and in no distress. Head: Normocephalic and atraumatic. Eyes: Conjunctivae are normal. Normal extraocular movements Cardiovascular: Normal rate, regular rhythm. Normal distal pulses. Respiratory: Normal respiratory effort. No wheezes/rales/rhonchi. Gastrointestinal: Soft and nontender. No distention. Musculoskeletal: Right knee without obvious deformity, dislocation, or joint effusion.  Patient with normal active range of motion including straight leg raise.  No patella ballottement is appreciated.  Mildly tender to palpation of the popliteal space and lateral knee joint.  No significant valgus or varus joint stress is elicited.  Nontender with normal range of motion in all extremities.  Neurologic:  Normal gait without ataxia. Normal speech and language. No gross focal neurologic deficits are appreciated. Skin:  Skin is warm, dry and intact. No rash noted. ____________________________________________   RADIOLOGY Official radiology report(s): DG Knee Complete 4 Views Right  Result Date: 09/01/2021 CLINICAL DATA:  Right  knee pain after injury today EXAM: RIGHT KNEE - COMPLETE 4+ VIEW COMPARISON:  02/11/2018 FINDINGS: A 1.4 by 0.6 by 1.1 cm well corticated ossific structure just above the anterior portion of the tibial spine may represent a fragmented osteophyte and could be a free osteochondral fragment. Advanced for age spurring in the lateral compartment with a slightly scalloped appearance of the lateral tibial plateau. There is a lesser degree of marginal spurring along the medial compartment and in the patellofemoral joint. Upper normal amount of fluid in the suprapatellar bursa. No acute fracture. IMPRESSION: 1. Possible free osteochondral fragment anterior to the tibial spine. 2. Tricompartmental spurring most striking in the lateral compartment. Slight scalloping of the lateral tibial plateau may be incidental or from remote injury. 3. If pain persists despite conservative therapy, MRI may be warranted for further characterization. Electronically Signed   By: 02/13/2018 M.D.   On: 09/01/2021 15:59   ____________________________________________  PROCEDURES  Hinged Knee immobilizer  Procedures ____________________________________________   INITIAL IMPRESSION / ASSESSMENT AND PLAN / ED COURSE  As part of my medical decision making, I reviewed the following data within the electronic MEDICAL RECORD NUMBER Radiograph reviewed as noted and Notes from prior ED visits   DDX: knee sprain, strain, dislocation  Patient ED evaluation of acute right knee pain after mechanical injury.  Patient reports a twisting injury on a planted foot.  He denies any other injury or fall.  He presents to the ED for evaluation of complaint.  X-rays negative for any acute fracture or dislocation.  Some significant underlying tricompartmental arthritis is appreciated.  Patient is placed in a knee immobilizer for support, and referred to his Ortho provider for further evaluation.  Prescriptions for Flexeril, Relafen, and a small  prescription for hydrocodone was provided with benefit.  Pesach Frisch was evaluated in Emergency Department on 09/01/2021 for the symptoms described in the history of present illness. He was evaluated in the context of the global COVID-19 pandemic, which necessitated consideration that the patient might be at risk for infection with the SARS-CoV-2 virus that causes COVID-19. Institutional protocols and algorithms that pertain to the evaluation of patients at risk for COVID-19 are in a state of rapid change based on information released by regulatory bodies including the CDC and federal and state organizations. These policies and algorithms were followed during the patient's care in the ED.  ____________________________________________  FINAL CLINICAL IMPRESSION(S) / ED DIAGNOSES  Final diagnoses:  Acute pain of right knee  Primary osteoarthritis of right knee  Strain of right knee, initial encounter      Lissa Hoard, PA-C 09/01/21 1803    Willy Eddy, MD 09/01/21 2024

## 2021-09-01 NOTE — ED Triage Notes (Signed)
Pt comes pov with right knee pain after injury today. Thinks it popped about 3-4 times while he tried to change direction too quickly. Has knee brace from home on it.

## 2021-09-04 ENCOUNTER — Emergency Department (HOSPITAL_COMMUNITY)
Admission: EM | Admit: 2021-09-04 | Discharge: 2021-09-04 | Disposition: A | Discharge status: Home / Self Care | Payer: Managed Care, Other (non HMO) | Attending: Emergency Medicine | Admitting: Emergency Medicine

## 2021-09-04 ENCOUNTER — Encounter (HOSPITAL_COMMUNITY): Payer: Self-pay

## 2021-09-04 ENCOUNTER — Emergency Department (HOSPITAL_COMMUNITY): Payer: Managed Care, Other (non HMO)

## 2021-09-04 ENCOUNTER — Other Ambulatory Visit: Payer: Self-pay

## 2021-09-04 DIAGNOSIS — I10 Essential (primary) hypertension: Secondary | ICD-10-CM | POA: Insufficient documentation

## 2021-09-04 DIAGNOSIS — Z79899 Other long term (current) drug therapy: Secondary | ICD-10-CM | POA: Insufficient documentation

## 2021-09-04 DIAGNOSIS — Z96641 Presence of right artificial hip joint: Secondary | ICD-10-CM | POA: Diagnosis not present

## 2021-09-04 DIAGNOSIS — F17210 Nicotine dependence, cigarettes, uncomplicated: Secondary | ICD-10-CM | POA: Insufficient documentation

## 2021-09-04 DIAGNOSIS — X501XXA Overexertion from prolonged static or awkward postures, initial encounter: Secondary | ICD-10-CM | POA: Insufficient documentation

## 2021-09-04 DIAGNOSIS — M25561 Pain in right knee: Secondary | ICD-10-CM | POA: Diagnosis present

## 2021-09-04 MED ORDER — IBUPROFEN 600 MG PO TABS: 600.0000 mg | ORAL_TABLET | Freq: Four times a day (QID) | ORAL | 0 refills | Status: DC | PRN

## 2021-09-04 MED ORDER — IBUPROFEN 800 MG PO TABS
800.0000 mg | ORAL_TABLET | Freq: Once | ORAL | Status: AC
  Administered 2021-09-04: 800 mg via ORAL
  Filled 2021-09-04: qty 1

## 2021-09-04 NOTE — Progress Notes (Signed)
Orthopedic Tech Progress Note Patient Details:  Charles Huang 12/10/1976 [id number]  RN provided pt with ace wrap and crutches.  Pt wants to put the ace on himself and reports proficiency with crutches.  Ortho tech completed order and will sign off.   Thanks,  Corinna Capra, PT, DPT  Acute Rehabilitation Ortho Tech Supervisor [phone] pager #[phone] office          Charles Huang 09/04/2021, 9:58 AM

## 2021-09-04 NOTE — ED Triage Notes (Signed)
Pt complains of right leg and right ankle pain. Pt states that he injured himself days ago and that it keeps popping.

## 2021-09-04 NOTE — Discharge Instructions (Signed)
Rest, elevate leg, ice, NSAIDS like motrin every 6 hours for the next 3 days, crutches as needed, follow up with orthopedic surgery for evaluation of knee menisci

## 2021-09-04 NOTE — ED Provider Notes (Signed)
[facility]-EMERGENCY DEPT Provider Note   CSN: [id number] Arrival date & time: 09/04/21  3825     History Chief Complaint  Patient presents with   Knee Injury    Charles Huang is a 45 y.o. male.  Pt is a 45 yo male presenting for knee pain. Pt states he twisted and injured his right knee four days ago, had original relief of pain, then re-injured it this morning at 2AM. States he twisted to the side, heard a popping noise, and had immediate pain behind the right knee. Denies falling to the ground or any other bony pain. States he has difficulty ambulating or bearing weight due to the pain. Denies any swelling, warmth, bruising, or open wounds.   The history is provided by the patient. No language interpreter was used.      Past Medical History:  Diagnosis Date   Arthritis    hands, neck, back, knees    Atrial fibrillation (HCC)    Dysrhythmia    Afib   Hyperlipidemia    Hypertension    Pre-diabetes    Prediabetes     Patient Active Problem List   Diagnosis Date Noted   Osteoarthritis of right hip 01/10/2021   Obesity (BMI 30-39.9) 11/13/2020   Annual physical exam 02/02/2020   Erectile dysfunction 02/02/2020   Hematuria 02/02/2020   Evaluation by psychiatric service required 09/06/2019   Chronic pain of both knees 02/18/2019   Chronic pain syndrome 08/12/2018   Vitamin D deficiency 08/12/2018   Low back pain 02/09/2018   Paroxysmal A-fib (HCC) 05/07/2017   Prediabetes    Suspected sleep apnea 04/10/2017   Essential hypertension 04/28/2016   Hyperlipidemia 04/28/2016   Morbid obesity with BMI of 40.0-44.9, adult (HCC) 04/28/2016   Primary osteoarthritis of both knees 04/28/2016    Past Surgical History:  Procedure Laterality Date   NO PAST SURGERIES     TOTAL HIP ARTHROPLASTY Right 01/10/2021   Procedure: TOTAL HIP ARTHROPLASTY ANTERIOR APPROACH;  Surgeon: Samson Frederic, MD;  Location: [facility];  Service: Orthopedics;  Laterality: Right;        Family History  Problem Relation Age of Onset   Arthritis Mother    Hyperlipidemia Mother    Stroke Mother    Mental illness Neg Hx     Social History   Tobacco Use   Smoking status: Light Smoker    Types: Cigarettes    Last attempt to quit: 05/29/2016    Years since quitting: 5.2   Smokeless tobacco: Never  Vaping Use   Vaping Use: Never used  Substance Use Topics   Alcohol use: Not Currently    Comment: socially   Drug use: No    Home Medications Prior to Admission medications   Medication Sig Start Date End Date Taking? Authorizing Provider  amLODipine (NORVASC) 5 MG tablet Take 1 tablet (5 mg total) by mouth daily. 11/13/20   McLean-Scocuzza, Pasty Spillers, MD  Cholecalciferol 1.25 MG (50000 UT) capsule Take 1 capsule (50,000 Units total) by mouth once a week. Patient not taking: No sig reported 11/13/20   McLean-Scocuzza, Pasty Spillers, MD  cyclobenzaprine (FLEXERIL) 5 MG tablet Take 1 tablet (5 mg total) by mouth 3 (three) times daily as needed. 09/01/21   Menshew, Charlesetta Ivory, PA-C  diclofenac (VOLTAREN) 75 MG EC tablet TAKE 1 TABLET BY MOUTH 2 TIMES DAILY AFTER A MEAL. AS NEEDED 06/11/21   McLean-Scocuzza, Pasty Spillers, MD  HYDROcodone-acetaminophen (NORCO) 5-325 MG tablet Take 1 tablet by  mouth 3 (three) times daily as needed. 09/01/21   Menshew, Charlesetta Ivory, PA-C  losartan-hydrochlorothiazide (HYZAAR) 100-25 MG tablet Take 1 tablet by mouth daily. In am Patient taking differently: Take 1 tablet by mouth daily. 11/13/20   McLean-Scocuzza, Pasty Spillers, MD  nabumetone (RELAFEN) 750 MG tablet Take 1 tablet (750 mg total) by mouth 2 (two) times daily for 15 days. 09/01/21 09/16/21  Menshew, Charlesetta Ivory, PA-C  ondansetron (ZOFRAN) 4 MG tablet Take 1 tablet (4 mg total) by mouth every 8 (eight) hours as needed for nausea or vomiting. 01/10/21   Samson Frederic, MD    Allergies    Patient has no known allergies.  Review of Systems   Review of Systems  Constitutional:  Negative  for chills and fever.  Respiratory:  Negative for chest tightness and shortness of breath.   Cardiovascular:  Negative for chest pain and leg swelling.  Musculoskeletal:  Positive for gait problem. Negative for back pain and joint swelling.  Skin:  Negative for color change and wound.   Physical Exam Updated Vital Signs BP (!) 135/95   Pulse 78   Temp 97.7 F (36.5 C) (Oral)   Resp 16   SpO2 100%   Physical Exam Vitals and nursing note reviewed.  Constitutional:      Appearance: Normal appearance.  HENT:     Head: Normocephalic and atraumatic.  Cardiovascular:     Rate and Rhythm: Normal rate and regular rhythm.     Pulses:          Dorsalis pedis pulses are 2+ on the right side and 2+ on the left side.  Pulmonary:     Effort: Pulmonary effort is normal. No respiratory distress.  Musculoskeletal:        General: Normal range of motion.     Right hip: Normal.     Left hip: Normal.     Right upper leg: Normal.     Left upper leg: Normal.     Right knee: No bony tenderness. Normal range of motion. Tenderness present.     Left knee: No bony tenderness. Normal range of motion. No tenderness.     Right lower leg: No bony tenderness.     Left lower leg: No bony tenderness.  Skin:    Capillary Refill: Capillary refill takes less than 2 seconds.  Neurological:     General: No focal deficit present.     Mental Status: He is alert and oriented to person, place, and time.     Sensory: Sensation is intact.     Motor: Motor function is intact.    ED Results / Procedures / Treatments   Labs (all labs ordered are listed, but only abnormal results are displayed) Labs Reviewed - No data to display  EKG None  Radiology DG Knee Complete 4 Views Right  Result Date: 09/04/2021 CLINICAL DATA:  45 year old male was walking" felt a pop". Severe posterior knee pain. EXAM: RIGHT KNEE - COMPLETE 4+ VIEW COMPARISON:  Right knee series 09/01/2021. FINDINGS: No definite joint effusion on  the cross-table lateral view. Patella appears stable and intact. Normal bone mineralization. Medial and lateral compartment degenerative spurring. Stable joint spaces. No acute osseous abnormality identified. IMPRESSION: Stable knee joint degeneration. No acute osseous abnormality identified. Electronically Signed   By: Odessa Fleming M.D.   On: 09/04/2021 07:04    Procedures Procedures   Medications Ordered in ED Medications - No data to display  ED Course  I have reviewed  the triage vital signs and the nursing notes.  Pertinent labs & imaging results that were available during my care of the patient were reviewed by me and considered in my medical decision making (see chart for details).    MDM Rules/Calculators/A&P  9:40 AM 45 yo male presenting for knee pain after injury. Pt is Aox3, no acute distress, afebrile, with stable vitals. Physical exam demonstrates tenderness in right popliteal region. No joint laxity. Normal ROM. Leg neurovascularly intact. Xray demonstrates no acute process. Pt given Motrin, ace wrap, crutches, with recommendations for rest/elevation/ice/nsaids. Pt recommended to contact orthopedic surgery today to schedule appointment for evaluation of ligaments/menisci.   Patient in no distress and overall condition improved here in the ED. Detailed discussions were had with the patient regarding current findings, and need for close f/u with PCP or on call doctor. The patient has been instructed to return immediately if the symptoms worsen in any way for re-evaluation. Patient verbalized understanding and is in agreement with current care plan. All questions answered prior to discharge.      Final Clinical Impression(s) / ED Diagnoses Final diagnoses:  Acute pain of right knee    Rx / DC Orders ED Discharge Orders     None        Franne Forts, DO 09/04/21 [phone]

## 2021-11-17 ENCOUNTER — Other Ambulatory Visit: Payer: Self-pay | Admitting: Internal Medicine

## 2021-11-17 DIAGNOSIS — M5440 Lumbago with sciatica, unspecified side: Secondary | ICD-10-CM

## 2021-11-17 DIAGNOSIS — M5416 Radiculopathy, lumbar region: Secondary | ICD-10-CM

## 2021-11-19 ENCOUNTER — Emergency Department
Admission: EM | Admit: 2021-11-19 | Discharge: 2021-11-19 | Disposition: A | Discharge status: Home / Self Care | Payer: Managed Care, Other (non HMO) | Attending: Emergency Medicine | Admitting: Emergency Medicine

## 2021-11-19 ENCOUNTER — Other Ambulatory Visit: Payer: Self-pay

## 2021-11-19 ENCOUNTER — Emergency Department: Payer: Managed Care, Other (non HMO)

## 2021-11-19 DIAGNOSIS — F17210 Nicotine dependence, cigarettes, uncomplicated: Secondary | ICD-10-CM | POA: Insufficient documentation

## 2021-11-19 DIAGNOSIS — Z79899 Other long term (current) drug therapy: Secondary | ICD-10-CM | POA: Diagnosis not present

## 2021-11-19 DIAGNOSIS — R1011 Right upper quadrant pain: Secondary | ICD-10-CM | POA: Diagnosis not present

## 2021-11-19 DIAGNOSIS — R0789 Other chest pain: Secondary | ICD-10-CM

## 2021-11-19 DIAGNOSIS — I10 Essential (primary) hypertension: Secondary | ICD-10-CM | POA: Diagnosis not present

## 2021-11-19 LAB — COMPREHENSIVE METABOLIC PANEL
ALT: 33 U/L (ref 0–44)
AST: 38 U/L (ref 15–41)
Albumin: 4.2 g/dL (ref 3.5–5.0)
Alkaline Phosphatase: 45 U/L (ref 38–126)
Anion gap: 6 (ref 5–15)
BUN: 20 mg/dL (ref 6–20)
CO2: 28 mmol/L (ref 22–32)
Calcium: 8.9 mg/dL (ref 8.9–10.3)
Chloride: 102 mmol/L (ref 98–111)
Creatinine, Ser: 1.16 mg/dL (ref 0.61–1.24)
GFR, Estimated: >60 mL/min (ref >60)
Glucose, Bld: 96 mg/dL (ref 70–99)
Potassium: 3.5 mmol/L (ref 3.5–5.1)
Sodium: 136 mmol/L (ref 135–145)
Total Bilirubin: 0.6 mg/dL (ref 0.3–1.2)
Total Protein: 7.6 g/dL (ref 6.5–8.1)

## 2021-11-19 LAB — CBC WITH DIFFERENTIAL/PLATELET
Abs Immature Granulocytes: 0.02 10*3/uL (ref 0.00–0.07)
Basophils Absolute: 0.1 10*3/uL (ref 0.0–0.1)
Basophils Relative: 1 %
Eosinophils Absolute: 0.2 10*3/uL (ref 0.0–0.5)
Eosinophils Relative: 2 %
HCT: 41.7 % (ref 39.0–52.0)
Hemoglobin: 13.8 g/dL (ref 13.0–17.0)
Immature Granulocytes: 0 %
Lymphocytes Relative: 37 %
Lymphs Abs: 2.5 10*3/uL (ref 0.7–4.0)
MCH: 28.3 pg (ref 26.0–34.0)
MCHC: 33.1 g/dL (ref 30.0–36.0)
MCV: 85.6 fL (ref 80.0–100.0)
Monocytes Absolute: 0.6 10*3/uL (ref 0.1–1.0)
Monocytes Relative: 8 %
Neutro Abs: 3.5 10*3/uL (ref 1.7–7.7)
Neutrophils Relative %: 52 %
Platelets: 237 10*3/uL (ref 150–400)
RBC: 4.87 MIL/uL (ref 4.22–5.81)
RDW: 15.5 % (ref 11.5–15.5)
WBC: 6.7 10*3/uL (ref 4.0–10.5)
nRBC: 0 % (ref 0.0–0.2)

## 2021-11-19 LAB — LIPASE, BLOOD: Lipase: 30 U/L (ref 11–51)

## 2021-11-19 MED ORDER — OXYCODONE-ACETAMINOPHEN 5-325 MG PO TABS
1.0000 | ORAL_TABLET | Freq: Once | ORAL | Status: AC
Start: 2021-11-19 — End: 2021-11-19
  Administered 2021-11-19: 1 via ORAL
  Filled 2021-11-19: qty 1

## 2021-11-19 MED ORDER — LIDOCAINE 5 % EX PTCH: 1.0000 | MEDICATED_PATCH | Freq: Two times a day (BID) | CUTANEOUS | 0 refills | Status: DC

## 2021-11-19 NOTE — ED Provider Notes (Signed)
[facility] Emergency Department Provider Note   ____________________________________________   Event Date/Time   First MD Initiated Contact with Patient 11/19/21 1206     (approximate)  I have reviewed the triage vital signs and the nursing notes.   HISTORY  Chief Complaint Chest Wall Pain   HPI Charles Huang is a 45 y.o. male with past medical history of hypertension, hyperlipidemia, atrial fibrillation, and chronic pain syndrome who presents to the ED complaining of chest wall pain.  Patient reports that he tripped and fell going up the stairs about a week ago, couple of days after that he began to notice pain over his right lower chest wall and the right upper quadrant of his abdomen.  Pain has been constant since then, described as sharp and not exacerbated or alleviated by anything in particular.  He has not had any nausea, vomiting, diarrhea, fever, cough, or shortness of breath.  He thinks the pain might be related to the fall, but states it did not come on until a few days afterwards and so he is also concerned about his gallbladder.  He has never had similar symptoms in the past, denies ever being told that he has gallstones.  He denies hitting his head or losing consciousness with the fall, denies any pain to his extremities.        Past Medical History:  Diagnosis Date   Arthritis    hands, neck, back, knees    Atrial fibrillation (HCC)    Dysrhythmia    Afib   Hyperlipidemia    Hypertension    Pre-diabetes    Prediabetes     Patient Active Problem List   Diagnosis Date Noted   Osteoarthritis of right hip 01/10/2021   Obesity (BMI 30-39.9) 11/13/2020   Annual physical exam 02/02/2020   Erectile dysfunction 02/02/2020   Hematuria 02/02/2020   Evaluation by psychiatric service required 09/06/2019   Chronic pain of both knees 02/18/2019   Chronic pain syndrome 08/12/2018   Vitamin D deficiency 08/12/2018   Low back pain 02/09/2018    Paroxysmal A-fib (HCC) 05/07/2017   Prediabetes    Suspected sleep apnea 04/10/2017   Essential hypertension 04/28/2016   Hyperlipidemia 04/28/2016   Morbid obesity with BMI of 40.0-44.9, adult (HCC) 04/28/2016   Primary osteoarthritis of both knees 04/28/2016    Past Surgical History:  Procedure Laterality Date   NO PAST SURGERIES     TOTAL HIP ARTHROPLASTY Right 01/10/2021   Procedure: TOTAL HIP ARTHROPLASTY ANTERIOR APPROACH;  Surgeon: Samson Frederic, MD;  Location: [facility];  Service: Orthopedics;  Laterality: Right;    Prior to Admission medications   Medication Sig Start Date End Date Taking? Authorizing Provider  lidocaine (LIDODERM) 5 % Place 1 patch onto the skin every 12 (twelve) hours. Remove & Discard patch within 12 hours or as directed by MD 11/19/21 11/19/22 Yes Chesley Noon, MD  amLODipine (NORVASC) 5 MG tablet Take 1 tablet (5 mg total) by mouth daily. 11/13/20   McLean-Scocuzza, Pasty Spillers, MD  Cholecalciferol 1.25 MG (50000 UT) capsule Take 1 capsule (50,000 Units total) by mouth once a week. Patient not taking: No sig reported 11/13/20   McLean-Scocuzza, Pasty Spillers, MD  cyclobenzaprine (FLEXERIL) 5 MG tablet Take 1 tablet (5 mg total) by mouth 3 (three) times daily as needed. 09/01/21   Menshew, Charlesetta Ivory, PA-C  diclofenac (VOLTAREN) 75 MG EC tablet TAKE 1 TABLET BY MOUTH 2 TIMES DAILY AFTER A MEAL. AS NEEDED 06/11/21  McLean-Scocuzza, Pasty Spillers, MD  HYDROcodone-acetaminophen (NORCO) 5-325 MG tablet Take 1 tablet by mouth 3 (three) times daily as needed. 09/01/21   Menshew, Charlesetta Ivory, PA-C  ibuprofen (ADVIL) 600 MG tablet Take 1 tablet (600 mg total) by mouth every 6 (six) hours as needed. 09/04/21   Franne Forts, DO  losartan-hydrochlorothiazide (HYZAAR) 100-25 MG tablet Take 1 tablet by mouth daily. In am Patient taking differently: Take 1 tablet by mouth daily. 11/13/20   McLean-Scocuzza, Pasty Spillers, MD  ondansetron (ZOFRAN) 4 MG tablet Take 1 tablet (4 mg total) by  mouth every 8 (eight) hours as needed for nausea or vomiting. 01/10/21   Samson Frederic, MD    Allergies Patient has no known allergies.  Family History  Problem Relation Age of Onset   Arthritis Mother    Hyperlipidemia Mother    Stroke Mother    Mental illness Neg Hx     Social History Social History   Tobacco Use   Smoking status: Light Smoker    Types: Cigarettes    Last attempt to quit: 05/29/2016    Years since quitting: 5.4   Smokeless tobacco: Never  Vaping Use   Vaping Use: Never used  Substance Use Topics   Alcohol use: Not Currently    Comment: socially   Drug use: No    Review of Systems  Constitutional: No fever/chills Eyes: No visual changes. ENT: No sore throat. Cardiovascular: Positive for chest wall pain. Respiratory: Denies shortness of breath. Gastrointestinal: Positive for abdominal pain.  No nausea, no vomiting.  No diarrhea.  No constipation. Genitourinary: Negative for dysuria. Musculoskeletal: Negative for back pain. Skin: Negative for rash. Neurological: Negative for headaches, focal weakness or numbness.  ____________________________________________   PHYSICAL EXAM:  VITAL SIGNS: ED Triage Vitals  Enc Vitals Group     BP 11/19/21 1131 (!) 140/99     Pulse Rate 11/19/21 1131 96     Resp 11/19/21 1131 18     Temp 11/19/21 1131 98 F (36.7 C)     Temp Source 11/19/21 1135 Oral     SpO2 11/19/21 1131 100 %     Weight --      Height --      Head Circumference --      Peak Flow --      Pain Score --      Pain Loc --      Pain Edu? --      Excl. in GC? --     Constitutional: Alert and oriented. Eyes: Conjunctivae are normal. Head: Atraumatic. Nose: No congestion/rhinnorhea. Mouth/Throat: Mucous membranes are moist. Neck: Normal ROM Cardiovascular: Normal rate, regular rhythm. Grossly normal heart sounds.  2+ radial pulses bilaterally. Respiratory: Normal respiratory effort.  No retractions. Lungs CTAB.  Tenderness to  palpation over right lower chest wall. Gastrointestinal: Soft and tender to palpation in the right upper quadrant with no rebound or guarding. No distention. Genitourinary: deferred Musculoskeletal: No lower extremity tenderness nor edema. Neurologic:  Normal speech and language. No gross focal neurologic deficits are appreciated. Skin:  Skin is warm, dry and intact. No rash noted. Psychiatric: Mood and affect are normal. Speech and behavior are normal.  ____________________________________________   LABS (all labs ordered are listed, but only abnormal results are displayed)  Labs Reviewed  CBC WITH DIFFERENTIAL/PLATELET  COMPREHENSIVE METABOLIC PANEL  LIPASE, BLOOD     PROCEDURES  Procedure(s) performed (including Critical Care):  Procedures   ____________________________________________   INITIAL IMPRESSION / ASSESSMENT AND  PLAN / ED COURSE      45 year old male with past medical history of hypertension, hyperlipidemia, atrial fibrillation, and chronic pain syndrome who presents to the ED complaining of right lower chest wall and right upper quadrant pain developing a couple of days after a trip and fall on the stairs.  Patient denies any trauma to his head or neck and is not on any blood thinners.  Pain is reproducible with palpation of his right lower chest wall and right upper quadrant.  Chest x-ray was performed and reviewed by me with no infiltrate, edema, effusion, or obvious rib fracture.  Given right upper quadrant tenderness, we will further assess with right upper quadrant ultrasound, check labs including LFTs and lipase.  We will treat symptomatically with dose of Percocet and reassess.  LFTs and lipase are within normal limits, right upper quadrant shows no evidence of biliary pathology does show mild fatty liver disease, unlikely to be contributing to patient's current symptoms.  Suspect pain is due to chest wall contusion and he is appropriate for discharge home  with PCP follow-up.  He was counseled to return to the ED for new worsening symptoms, patient agrees with plan.      ____________________________________________   FINAL CLINICAL IMPRESSION(S) / ED DIAGNOSES  Final diagnoses:  RUQ pain  Chest wall pain     ED Discharge Orders          Ordered    lidocaine (LIDODERM) 5 %  Every 12 hours        11/19/21 1447             Note:  This document was prepared using Dragon voice recognition software and may include unintentional dictation errors.    Blake Divine, MD 11/19/21 [phone]

## 2021-11-19 NOTE — ED Triage Notes (Signed)
Pt comes with c/o right rib pain and fall.

## 2021-11-29 ENCOUNTER — Encounter: Payer: Self-pay | Admitting: Internal Medicine

## 2021-11-29 ENCOUNTER — Ambulatory Visit (INDEPENDENT_AMBULATORY_CARE_PROVIDER_SITE_OTHER): Payer: Managed Care, Other (non HMO) | Admitting: Internal Medicine

## 2021-11-29 ENCOUNTER — Other Ambulatory Visit (HOSPITAL_COMMUNITY): Payer: Self-pay

## 2021-11-29 ENCOUNTER — Other Ambulatory Visit: Payer: Self-pay

## 2021-11-29 VITALS — BP 134/86 | HR 100 | Temp 97.2°F | Ht 76.0 in | Wt 336.8 lb

## 2021-11-29 DIAGNOSIS — E785 Hyperlipidemia, unspecified: Secondary | ICD-10-CM

## 2021-11-29 DIAGNOSIS — Z1211 Encounter for screening for malignant neoplasm of colon: Secondary | ICD-10-CM | POA: Diagnosis not present

## 2021-11-29 DIAGNOSIS — M62838 Other muscle spasm: Secondary | ICD-10-CM

## 2021-11-29 DIAGNOSIS — Z0000 Encounter for general adult medical examination without abnormal findings: Secondary | ICD-10-CM

## 2021-11-29 DIAGNOSIS — Z1329 Encounter for screening for other suspected endocrine disorder: Secondary | ICD-10-CM

## 2021-11-29 DIAGNOSIS — Z6841 Body Mass Index (BMI) 40.0 and over, adult: Secondary | ICD-10-CM

## 2021-11-29 DIAGNOSIS — Z23 Encounter for immunization: Secondary | ICD-10-CM

## 2021-11-29 DIAGNOSIS — Z125 Encounter for screening for malignant neoplasm of prostate: Secondary | ICD-10-CM

## 2021-11-29 DIAGNOSIS — R7303 Prediabetes: Secondary | ICD-10-CM | POA: Diagnosis not present

## 2021-11-29 DIAGNOSIS — I10 Essential (primary) hypertension: Secondary | ICD-10-CM | POA: Diagnosis not present

## 2021-11-29 DIAGNOSIS — R110 Nausea: Secondary | ICD-10-CM

## 2021-11-29 DIAGNOSIS — E559 Vitamin D deficiency, unspecified: Secondary | ICD-10-CM

## 2021-11-29 DIAGNOSIS — Z0184 Encounter for antibody response examination: Secondary | ICD-10-CM

## 2021-11-29 MED ORDER — LOSARTAN POTASSIUM-HCTZ 100-25 MG PO TABS: 1.0000 | ORAL_TABLET | Freq: Every day | ORAL | 3 refills | Status: DC

## 2021-11-29 MED ORDER — AMLODIPINE BESYLATE 5 MG PO TABS: 5.0000 mg | ORAL_TABLET | Freq: Every day | ORAL | 3 refills | Status: DC

## 2021-11-29 MED ORDER — OZEMPIC (0.25 OR 0.5 MG/DOSE) 2 MG/1.5ML ~~LOC~~ SOPN
0.2500 mg | PEN_INJECTOR | SUBCUTANEOUS | 2 refills | Status: DC
  Filled 2021-11-29: qty 3, 90d supply, fill #0

## 2021-11-29 MED ORDER — CYCLOBENZAPRINE HCL 5 MG PO TABS
5.0000 mg | ORAL_TABLET | Freq: Two times a day (BID) | ORAL | 1 refills | Status: DC | PRN
Start: 2021-11-29 — End: 2022-05-23

## 2021-11-29 MED ORDER — ONDANSETRON 4 MG PO TBDP: 4.0000 mg | ORAL_TABLET | Freq: Every day | ORAL | 2 refills | Status: DC | PRN

## 2021-11-29 NOTE — Progress Notes (Signed)
Chief Complaint  Patient presents with   Weight Loss   Colonoscopy    Discuss need or referral    Prostate Check   Annual Exam   annual 1. Htn not taking norvasc 5 mg qd nor hyzaar 100-25 mg qd  2. 4/10 right knee pain will f/u Dr. Lyla Glassing had right total hip  3. Disc wt loss obesity and needs to lose wt for surgery for #2  Given sample ozempic   Review of Systems  Constitutional:  Negative for weight loss.  HENT:  Negative for hearing loss.   Eyes:  Negative for blurred vision.  Respiratory:  Negative for shortness of breath.   Cardiovascular:  Negative for chest pain.  Gastrointestinal:  Negative for abdominal pain and blood in stool.  Musculoskeletal:  Negative for back pain.  Skin:  Negative for rash.  Neurological:  Negative for headaches.  Psychiatric/Behavioral:  Negative for depression.   Past Medical History:  Diagnosis Date   Arthritis    hands, neck, back, knees    Atrial fibrillation ([place])    Dysrhythmia    Afib   Hyperlipidemia    Hypertension    Pre-diabetes    Prediabetes    Past Surgical History:  Procedure Laterality Date   NO PAST SURGERIES     TOTAL HIP ARTHROPLASTY Right 01/10/2021   Procedure: TOTAL HIP ARTHROPLASTY ANTERIOR APPROACH;  Surgeon: Rod Can, MD;  Location: [facility];  Service: Orthopedics;  Laterality: Right;   Family History  Problem Relation Age of Onset   Arthritis Mother    Hyperlipidemia Mother    Stroke Mother    Mental illness Neg Hx    Social History   Socioeconomic History   Marital status: Divorced    Spouse name: Not on file   Number of children: 4   Years of education: Not on file   Highest education level: Associate degree: academic program  Occupational History   Not on file  Tobacco Use   Smoking status: Former    Types: Cigarettes    Quit date: 05/29/2016    Years since quitting: 5.5   Smokeless tobacco: Never  Vaping Use   Vaping Use: Never used  Substance and Sexual Activity   Alcohol use: Not  Currently    Comment: socially   Drug use: No   Sexual activity: Yes    Birth control/protection: Condom  Other Topics Concern   Not on file  Social History Narrative   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 AS NEEDED   ondansetron (ZOFRAN-ODT) 4 MG disintegrating tablet Take 1 tablet (4 mg total) by mouth daily as needed for nausea or vomiting.   [DISCONTINUED] amLODipine (NORVASC) 5 MG tablet Take 1 tablet (5 mg total) by mouth daily.   [DISCONTINUED] cyclobenzaprine (FLEXERIL) 5 MG tablet Take 1 tablet (5 mg total) by mouth 3 (three) times daily as needed.   [DISCONTINUED] losartan-hydrochlorothiazide (HYZAAR) 100-25 MG tablet Take 1 tablet by mouth daily. In am (Patient taking differently: Take 1 tablet by mouth  daily.)   [DISCONTINUED] Semaglutide,0.25 or 0.5MG/DOS, (OZEMPIC, 0.25 OR 0.5 MG/DOSE,) 2 MG/1.5ML SOPN Inject 0.25 mg into the skin once a week for 1 month then increase to 0.5 weekly for 1 month   No Known Allergies Recent Results (from the past 2160 hour(s))  CBC with Differential     Status: None   Collection Time: 11/19/21 12:59 PM  Result Value Ref Range   WBC 6.7 4.0 - 10.5 K/uL   RBC 4.87 4.22 - 5.81 MIL/uL   Hemoglobin 13.8 13.0 - 17.0 g/dL   HCT 41.7 39.0 - 52.0 %   MCV 85.6 80.0 - 100.0 fL   MCH 28.3 26.0 - 34.0 pg   MCHC 33.1 30.0 - 36.0 g/dL   RDW 15.5 11.5 - 15.5 %   Platelets 237 150 - 400 K/uL   nRBC 0.0 0.0 - 0.2 %   Neutrophils Relative % 52 %   Neutro Abs 3.5 1.7 - 7.7 K/uL   Lymphocytes Relative 37 %   Lymphs Abs 2.5 0.7 - 4.0 K/uL   Monocytes  Relative 8 %   Monocytes Absolute 0.6 0.1 - 1.0 K/uL   Eosinophils Relative 2 %   Eosinophils Absolute 0.2 0.0 - 0.5 K/uL   Basophils Relative 1 %   Basophils Absolute 0.1 0.0 - 0.1 K/uL   Immature Granulocytes 0 %   Abs Immature Granulocytes 0.02 0.00 - 0.07 K/uL    Comment: Performed at [facility], [address] [place], [place] [address]  Comprehensive metabolic panel     Status: None   Collection Time: 11/19/21 12:59 PM  Result Value Ref Range   Sodium 136 135 - 145 mmol/L   Potassium 3.5 3.5 - 5.1 mmol/L   Chloride 102 98 - 111 mmol/L   CO2 28 22 - 32 mmol/L   Glucose, Bld 96 70 - 99 mg/dL    Comment: Glucose reference range applies only to samples taken after fasting for at least 8 hours.   BUN 20 6 - 20 mg/dL   Creatinine, Ser 1.16 0.61 - 1.24 mg/dL   Calcium 8.9 8.9 - 10.3 mg/dL   Total Protein 7.6 6.5 - 8.1 g/dL   Albumin 4.2 3.5 - 5.0 g/dL   AST 38 15 - 41 U/L   ALT 33 0 - 44 U/L   Alkaline Phosphatase 45 38 - 126 U/L   Total Bilirubin 0.6 0.3 - 1.2 mg/dL   GFR, Estimated >60 >60 mL/min    Comment: (NOTE) Calculated using the CKD-EPI Creatinine Equation (2021)    Anion gap 6 5 - 15    Comment: Performed at [facility], [address] [place], [place] [address]  Lipase, blood     Status: None   Collection Time: 11/19/21 12:59 PM  Result Value Ref Range   Lipase 30 11 - 51 U/L    Comment: Performed at [facility], [address] [place], [place] [address]   Objective  Body mass index is 41 kg/m. Wt Readings from Last 3 Encounters:  11/29/21 (!) 336 lb 12.8 oz (152.8 kg)  09/01/21 (!) 325 lb (147.4 kg)  01/10/21 (!) 308 lb (139.7 kg)   Temp Readings from Last 3 Encounters:  11/29/21 (!) 97.2 F (36.2 C) (Temporal)  11/19/21 98.4 F (36.9 C) (Oral)  09/04/21 97.7 F (36.5 C) (Oral)   BP Readings from Last 3 Encounters:  11/29/21 134/86  11/19/21 (!) 151/89  09/04/21 (!) 143/94   Pulse Readings from Last 3  Encounters:  11/29/21 100  11/19/21 (!) 113  09/04/21 75    Physical Exam Vitals and nursing note reviewed.  Constitutional:      Appearance: Normal appearance. He is well-developed and well-groomed. He is obese.  HENT:     Head: Normocephalic and atraumatic.  Eyes:     Conjunctiva/sclera: Conjunctivae normal.     Pupils: Pupils are equal, round, and reactive to light.  Cardiovascular:     Rate and Rhythm: Normal rate and regular rhythm.     Heart sounds: Normal heart sounds.  Pulmonary:     Effort: Pulmonary effort is normal. No respiratory distress.     Breath sounds: Normal breath sounds.  Abdominal:     Tenderness: There is no abdominal tenderness.  Skin:    General: Skin is warm and moist.  Neurological:     General: No focal deficit present.     Mental Status: He is alert and oriented to person, place, and time. Mental status is at baseline.     Sensory: Sensation is intact.     Motor: Motor function is intact.     Coordination: Coordination is intact.     Gait: Gait is intact. Gait normal.  Psychiatric:        Attention and Perception: Attention and perception normal.        Mood and Affect: Mood and affect normal.        Speech: Speech normal.        Behavior: Behavior normal. Behavior is cooperative.        Thought Content: Thought content normal.        Cognition and Memory: Cognition and memory normal.        Judgment: Judgment normal.    Assessment  Plan  Annual physical exam See below  Prediabetes - Plan: Hemoglobin A1c  Essential hypertension sl elevated not taking medication - Plan: losartan-hydrochlorothiazide (HYZAAR) 100-25 MG tablet, amLODipine (NORVASC) 5 MG tablet  Hyperlipidemia, unspecified hyperlipidemia type - Plan: Lipid panel  Morbid obesity with BMI of 40.0-44.9, adult ([place]) Ozempic sample given 0.25 with prn zofran  Muscle spasm - Plan: cyclobenzaprine (FLEXERIL) 5 MG tablet   HM Declines flu shot  Tdap utd Will given hep B1/3  doses  -had new hep B vaccines 2/2 Check titer  Prevnar given today pna 23 vaccine utd  MMR immune  covid vaccine had 2/2 pfizer consider booster   psa check 45  Colonoscopy screen start 28 [facility] congrat smoking and etoh cessation  rec healthy diet and exercise  Former smoker   Provider: Dr. Olivia Mackie McLean-Scocuzza-Internal Medicine

## 2021-11-29 NOTE — Patient Instructions (Addendum)
MD Physician   Primary Contact Information  Phone Fax E-mail Address  [phone] Not available [address]   [place] [address]     Specialties     Gastroenterology      Consider new pfizer rec total 4 doses     Pneumococcal Conjugate Vaccine (Prevnar 13) Suspension for Injection What is this medication? PNEUMOCOCCAL VACCINE (NEU mo KOK al vak SEEN) is a vaccine used to prevent pneumococcus bacterial infections. These bacteria can cause serious infections like pneumonia, meningitis, and blood infections. This vaccine will lower your chance of getting pneumonia. If you do get pneumonia, it can make your symptoms milder and your illness shorter. This vaccine will not treat an infection and will not cause infection. This vaccine is recommended for infants and young children, adults with certain medical conditions, and adults 65 years or older. This medicine may be used for other purposes; ask your health care provider or pharmacist if you have questions. COMMON BRAND NAME(S): Prevnar, Prevnar 13 What should I tell my care team before I take this medication? They need to know if you have any of these conditions: bleeding problems fever immune system problems an unusual or allergic reaction to pneumococcal vaccine, diphtheria toxoid, other vaccines, latex, other medicines, foods, dyes, or preservatives pregnant or trying to get pregnant breast-feeding How should I use this medication? This vaccine is for injection into a muscle. It is given by a health care professional. A copy of Vaccine Information Statements will be given before each vaccination. Read this sheet carefully each time. The sheet may change frequently. Talk to your pediatrician regarding the use of this medicine in children. While this drug may be prescribed for children as young as 61 weeks old for selected conditions, precautions do apply. Overdosage: If you think you have taken too much of this  medicine contact a poison control center or emergency room at once. NOTE: This medicine is only for you. Do not share this medicine with others. What if I miss a dose? It is important not to miss your dose. Call your doctor or health care professional if you are unable to keep an appointment. What may interact with this medication? medicines for cancer chemotherapy medicines that suppress your immune function steroid medicines like prednisone or cortisone This list may not describe all possible interactions. Give your health care provider a list of all the medicines, herbs, non-prescription drugs, or dietary supplements you use. Also tell them if you smoke, drink alcohol, or use illegal drugs. Some items may interact with your medicine. What should I watch for while using this medication? Mild fever and pain should go away in 3 days or less. Report any unusual symptoms to your doctor or health care professional. What side effects may I notice from receiving this medication? Side effects that you should report to your doctor or health care professional as soon as possible: allergic reactions like skin rash, itching or hives, swelling of the face, lips, or tongue breathing problems confused fast or irregular heartbeat fever over 102 degrees F seizures unusual bleeding or bruising unusual muscle weakness Side effects that usually do not require medical attention (report to your doctor or health care professional if they continue or are bothersome): aches and pains diarrhea fever of 102 degrees F or less headache irritable loss of appetite pain, tender at site where injected trouble sleeping This list may not describe all possible side effects. Call your doctor for medical advice about side effects. You  may report side effects to FDA at 1-800-FDA-1088. Where should I keep my medication? This does not apply. This vaccine is given in a clinic, pharmacy, doctor's office, or other health care  setting and will not be stored at home. NOTE: This sheet is a summary. It may not cover all possible information. If you have questions about this medicine, talk to your doctor, pharmacist, or health care provider.  2022 Elsevier/Gold Standard (2014-09-07 00:00:00)  Semaglutide Injection What is this medication? SEMAGLUTIDE (SEM a GLOO tide) treats type 2 diabetes. It works by increasing insulin levels in your body, which decreases your blood sugar (glucose). It also reduces the amount of sugar released into the blood and slows down your digestion. It can also be used to lower the risk of heart attack and stroke in people with type 2 diabetes. Changes to diet and exercise are often combined with this medication. This medicine may be used for other purposes; ask your health care provider or pharmacist if you have questions. COMMON BRAND NAME(S): OZEMPIC What should I tell my care team before I take this medication? They need to know if you have any of these conditions: Endocrine tumors (MEN 2) or if someone in your family had these tumors Eye disease, vision problems History of pancreatitis Kidney disease Stomach problems Thyroid cancer or if someone in your family had thyroid cancer An unusual or allergic reaction to semaglutide, other medications, foods, dyes, or preservatives Pregnant or trying to get pregnant Breast-feeding How should I use this medication? This medication is for injection under the skin of your upper leg (thigh), stomach area, or upper arm. It is given once every week (every 7 days). You will be taught how to prepare and give this medication. Use exactly as directed. Take your medication at regular intervals. Do not take it more often than directed. If you use this medication with insulin, you should inject this medication and the insulin separately. Do not mix them together. Do not give the injections right next to each other. Change (rotate) injection sites with each  injection. It is important that you put your used needles and syringes in a special sharps container. Do not put them in a trash can. If you do not have a sharps container, call your pharmacist or care team to get one. A special MedGuide will be given to you by the pharmacist with each prescription and refill. Be sure to read this information carefully each time. This medication comes with INSTRUCTIONS FOR USE. Ask your pharmacist for directions on how to use this medication. Read the information carefully. Talk to your pharmacist or care team if you have questions. Talk to your care team about the use of this medication in children. Special care may be needed. Overdosage: If you think you have taken too much of this medicine contact a poison control center or emergency room at once. NOTE: This medicine is only for you. Do not share this medicine with others. What if I miss a dose? If you miss a dose, take it as soon as you can within 5 days after the missed dose. Then take your next dose at your regular weekly time. If it has been longer than 5 days after the missed dose, do not take the missed dose. Take the next dose at your regular time. Do not take double or extra doses. If you have questions about a missed dose, contact your care team for advice. What may interact with this medication? Other medications for  diabetes Many medications may cause changes in blood sugar, these include: Alcohol containing beverages Antiviral medications for HIV or AIDS Aspirin and aspirin-like medications Certain medications for blood pressure, heart disease, irregular heart beat Chromium Diuretics Male hormones, such as estrogens or progestins, birth control pills Fenofibrate Gemfibrozil Isoniazid Lanreotide Male hormones or anabolic steroids MAOIs like Carbex, Eldepryl, Marplan, Nardil, and Parnate Medications for weight loss Medications for allergies, asthma, cold, or cough Medications for depression,  anxiety, or psychotic disturbances Niacin Nicotine NSAIDs, medications for pain and inflammation, like ibuprofen or naproxen Octreotide Pasireotide Pentamidine Phenytoin Probenecid Quinolone antibiotics such as ciprofloxacin, levofloxacin, ofloxacin Some herbal dietary supplements Steroid medications such as prednisone or cortisone Sulfamethoxazole; trimethoprim Thyroid hormones Some medications can hide the warning symptoms of low blood sugar (hypoglycemia). You may need to monitor your blood sugar more closely if you are taking one of these medications. These include: Beta-blockers, often used for high blood pressure or heart problems (examples include atenolol, metoprolol, propranolol) Clonidine Guanethidine Reserpine This list may not describe all possible interactions. Give your health care provider a list of all the medicines, herbs, non-prescription drugs, or dietary supplements you use. Also tell them if you smoke, drink alcohol, or use illegal drugs. Some items may interact with your medicine. What should I watch for while using this medication? Visit your care team for regular checks on your progress. Drink plenty of fluids while taking this medication. Check with your care team if you get an attack of severe diarrhea, nausea, and vomiting. The loss of too much body fluid can make it dangerous for you to take this medication. A test called the HbA1C (A1C) will be monitored. This is a simple blood test. It measures your blood sugar control over the last 2 to 3 months. You will receive this test every 3 to 6 months. Learn how to check your blood sugar. Learn the symptoms of low and high blood sugar and how to manage them. Always carry a quick-source of sugar with you in case you have symptoms of low blood sugar. Examples include hard sugar candy or glucose tablets. Make sure others know that you can choke if you eat or drink when you develop serious symptoms of low blood sugar, such  as seizures or unconsciousness. They must get medical help at once. Tell your care team if you have high blood sugar. You might need to change the dose of your medication. If you are sick or exercising more than usual, you might need to change the dose of your medication. Do not skip meals. Ask your care team if you should avoid alcohol. Many nonprescription cough and cold products contain sugar or alcohol. These can affect blood sugar. Pens should never be shared. Even if the needle is changed, sharing may result in passing of viruses like hepatitis or HIV. Wear a medical ID bracelet or chain, and carry a card that describes your disease and details of your medication and dosage times. Do not become pregnant while taking this medication. Women should inform their care team if they wish to become pregnant or think they might be pregnant. There is a potential for serious side effects to an unborn child. Talk to your care team for more information. What side effects may I notice from receiving this medication? Side effects that you should report to your care team as soon as possible: Allergic reactions--skin rash, itching, hives, swelling of the face, lips, tongue, or throat Change in vision Dehydration--increased thirst, dry mouth, feeling faint  or lightheaded, headache, dark yellow or brown urine Gallbladder problems--severe stomach pain, nausea, vomiting, fever Heart palpitations--rapid, pounding, or irregular heartbeat Kidney injury--decrease in the amount of urine, swelling of the ankles, hands, or feet Pancreatitis--severe stomach pain that spreads to your back or gets worse after eating or when touched, fever, nausea, vomiting Thyroid cancer--new mass or lump in the neck, pain or trouble swallowing, trouble breathing, hoarseness Side effects that usually do not require medical attention (report to your care team if they continue or are bothersome): Diarrhea Loss of appetite Nausea Stomach  pain Vomiting This list may not describe all possible side effects. Call your doctor for medical advice about side effects. You may report side effects to FDA at 1-800-FDA-1088. Where should I keep my medication? Keep out of the reach of children. Store unopened pens in a refrigerator between 2 and 8 degrees C (36 and 46 degrees F). Do not freeze. Protect from light and heat. After you first use the pen, it can be stored for 56 days at room temperature between 15 and 30 degrees C (59 and 86 degrees F) or in a refrigerator. Throw away your used pen after 56 days or after the expiration date, whichever comes first. Do not store your pen with the needle attached. If the needle is left on, medication may leak from the pen. NOTE: This sheet is a summary. It may not cover all possible information. If you have questions about this medicine, talk to your doctor, pharmacist, or health care provider.  2022 Elsevier/Gold Standard (2021-03-07 00:00:00)

## 2021-12-02 ENCOUNTER — Other Ambulatory Visit: Payer: Self-pay

## 2021-12-02 MED ORDER — OZEMPIC (0.25 OR 0.5 MG/DOSE) 2 MG/1.5ML ~~LOC~~ SOPN: 0.2500 mg | PEN_INJECTOR | SUBCUTANEOUS | 2 refills | Status: DC

## 2021-12-03 ENCOUNTER — Other Ambulatory Visit: Payer: Managed Care, Other (non HMO)

## 2021-12-15 ENCOUNTER — Other Ambulatory Visit: Payer: Self-pay | Admitting: Internal Medicine

## 2021-12-15 DIAGNOSIS — G8929 Other chronic pain: Secondary | ICD-10-CM

## 2021-12-15 DIAGNOSIS — M25561 Pain in right knee: Secondary | ICD-10-CM

## 2022-01-08 ENCOUNTER — Encounter: Payer: Self-pay | Admitting: Internal Medicine

## 2022-01-08 NOTE — Telephone Encounter (Signed)
Please advise, can Patient be seen for tooth pain and antibiotic? Or do we need to send list of dentist in the area?

## 2022-02-07 ENCOUNTER — Other Ambulatory Visit (INDEPENDENT_AMBULATORY_CARE_PROVIDER_SITE_OTHER): Payer: Managed Care, Other (non HMO)

## 2022-02-07 ENCOUNTER — Other Ambulatory Visit: Payer: Self-pay

## 2022-02-07 ENCOUNTER — Other Ambulatory Visit: Payer: Self-pay | Admitting: Internal Medicine

## 2022-02-07 DIAGNOSIS — E559 Vitamin D deficiency, unspecified: Secondary | ICD-10-CM

## 2022-02-07 DIAGNOSIS — Z125 Encounter for screening for malignant neoplasm of prostate: Secondary | ICD-10-CM

## 2022-02-07 DIAGNOSIS — E785 Hyperlipidemia, unspecified: Secondary | ICD-10-CM | POA: Diagnosis not present

## 2022-02-07 DIAGNOSIS — Z1329 Encounter for screening for other suspected endocrine disorder: Secondary | ICD-10-CM

## 2022-02-07 DIAGNOSIS — R7303 Prediabetes: Secondary | ICD-10-CM | POA: Diagnosis not present

## 2022-02-07 DIAGNOSIS — Z0184 Encounter for antibody response examination: Secondary | ICD-10-CM

## 2022-02-07 LAB — LIPID PANEL
Cholesterol: 225 mg/dL — ABNORMAL HIGH (ref 0–200)
HDL: 46 mg/dL (ref >39.00)
LDL Cholesterol: 152 mg/dL — ABNORMAL HIGH (ref 0–99)
NonHDL: 178.81
Total CHOL/HDL Ratio: 5
Triglycerides: 132 mg/dL (ref 0.0–149.0)
VLDL: 26.4 mg/dL (ref 0.0–40.0)

## 2022-02-07 LAB — TSH: TSH: 1.69 u[IU]/mL (ref 0.35–5.50)

## 2022-02-07 LAB — VITAMIN D 25 HYDROXY (VIT D DEFICIENCY, FRACTURES): VITD: 22.16 ng/mL — ABNORMAL LOW (ref 30.00–100.00)

## 2022-02-07 LAB — HEMOGLOBIN A1C: Hgb A1c MFr Bld: 5.7 % (ref 4.6–6.5)

## 2022-02-07 LAB — PSA: PSA: 0.2 ng/mL (ref 0.10–4.00)

## 2022-02-07 MED ORDER — CHOLECALCIFEROL 1.25 MG (50000 UT) PO CAPS: 50000.0000 [IU] | ORAL_CAPSULE | ORAL | 1 refills | Status: DC

## 2022-02-10 LAB — HEPATITIS B SURFACE ANTIBODY, QUANTITATIVE: Hep B S AB Quant (Post): 310 m[IU]/mL (ref >=10)

## 2022-02-18 ENCOUNTER — Other Ambulatory Visit: Payer: Self-pay | Admitting: Internal Medicine

## 2022-02-18 DIAGNOSIS — E785 Hyperlipidemia, unspecified: Secondary | ICD-10-CM

## 2022-02-18 MED ORDER — ATORVASTATIN CALCIUM 10 MG PO TABS: 10.0000 mg | ORAL_TABLET | Freq: Every day | ORAL | 3 refills | Status: DC

## 2022-04-24 ENCOUNTER — Encounter: Payer: Self-pay | Admitting: Internal Medicine

## 2022-04-24 DIAGNOSIS — Z6841 Body Mass Index (BMI) 40.0 and over, adult: Secondary | ICD-10-CM | POA: Insufficient documentation

## 2022-04-28 ENCOUNTER — Telehealth: Payer: Self-pay

## 2022-04-28 NOTE — Telephone Encounter (Signed)
PA has been started on 04/28/22 for pt's Semaglutide,0.25 or 0.5MG /DOS, (OZEMPIC, 0.25 OR 0.5 MG/DOSE,) 2 MG/1.5ML SOPN  ? ?Key: [id number] ?Rx #: [id number] ? ?Awaiting denial or approval. ?

## 2022-05-23 ENCOUNTER — Other Ambulatory Visit: Payer: Self-pay | Admitting: Internal Medicine

## 2022-05-23 DIAGNOSIS — M62838 Other muscle spasm: Secondary | ICD-10-CM

## 2022-05-30 ENCOUNTER — Ambulatory Visit: Payer: Managed Care, Other (non HMO) | Admitting: Internal Medicine

## 2022-07-10 ENCOUNTER — Other Ambulatory Visit: Payer: Self-pay | Admitting: Internal Medicine

## 2022-07-10 DIAGNOSIS — G8929 Other chronic pain: Secondary | ICD-10-CM

## 2022-08-28 ENCOUNTER — Ambulatory Visit: Payer: Managed Care, Other (non HMO) | Admitting: Internal Medicine

## 2022-09-17 ENCOUNTER — Encounter: Payer: Self-pay | Admitting: Internal Medicine

## 2022-09-17 ENCOUNTER — Ambulatory Visit (INDEPENDENT_AMBULATORY_CARE_PROVIDER_SITE_OTHER): Payer: BLUE CROSS/BLUE SHIELD | Admitting: Internal Medicine

## 2022-09-17 VITALS — BP 124/64 | HR 87 | Temp 98.2°F | Ht 76.0 in | Wt 325.4 lb

## 2022-09-17 DIAGNOSIS — E559 Vitamin D deficiency, unspecified: Secondary | ICD-10-CM

## 2022-09-17 DIAGNOSIS — R7303 Prediabetes: Secondary | ICD-10-CM

## 2022-09-17 DIAGNOSIS — E538 Deficiency of other specified B group vitamins: Secondary | ICD-10-CM

## 2022-09-17 DIAGNOSIS — I10 Essential (primary) hypertension: Secondary | ICD-10-CM

## 2022-09-17 DIAGNOSIS — Z1211 Encounter for screening for malignant neoplasm of colon: Secondary | ICD-10-CM

## 2022-09-17 DIAGNOSIS — Z1389 Encounter for screening for other disorder: Secondary | ICD-10-CM

## 2022-09-17 DIAGNOSIS — E785 Hyperlipidemia, unspecified: Secondary | ICD-10-CM | POA: Diagnosis not present

## 2022-09-17 DIAGNOSIS — Z8639 Personal history of other endocrine, nutritional and metabolic disease: Secondary | ICD-10-CM

## 2022-09-17 DIAGNOSIS — E611 Iron deficiency: Secondary | ICD-10-CM

## 2022-09-17 LAB — CBC WITH DIFFERENTIAL/PLATELET
Basophils Absolute: 0.1 10*3/uL (ref 0.0–0.1)
Basophils Relative: 1 % (ref 0.0–3.0)
Eosinophils Absolute: 0.1 10*3/uL (ref 0.0–0.7)
Eosinophils Relative: 1.6 % (ref 0.0–5.0)
HCT: 44.5 % (ref 39.0–52.0)
Hemoglobin: 14.6 g/dL (ref 13.0–17.0)
Lymphocytes Relative: 28.5 % (ref 12.0–46.0)
Lymphs Abs: 1.9 10*3/uL (ref 0.7–4.0)
MCHC: 32.8 g/dL (ref 30.0–36.0)
MCV: 83.8 fl (ref 78.0–100.0)
Monocytes Absolute: 0.5 10*3/uL (ref 0.1–1.0)
Monocytes Relative: 7.9 % (ref 3.0–12.0)
Neutro Abs: 4 10*3/uL (ref 1.4–7.7)
Neutrophils Relative %: 61 % (ref 43.0–77.0)
Platelets: 250 10*3/uL (ref 150.0–400.0)
RBC: 5.31 Mil/uL (ref 4.22–5.81)
RDW: 15.5 % (ref 11.5–15.5)
WBC: 6.5 10*3/uL (ref 4.0–10.5)

## 2022-09-17 LAB — HEMOGLOBIN A1C: Hgb A1c MFr Bld: 6.1 % (ref 4.6–6.5)

## 2022-09-17 LAB — COMPREHENSIVE METABOLIC PANEL
ALT: 35 U/L (ref 0–53)
AST: 35 U/L (ref 0–37)
Albumin: 4.9 g/dL (ref 3.5–5.2)
Alkaline Phosphatase: 51 U/L (ref 39–117)
BUN: 22 mg/dL (ref 6–23)
CO2: 29 mEq/L (ref 19–32)
Calcium: 10 mg/dL (ref 8.4–10.5)
Chloride: 100 mEq/L (ref 96–112)
Creatinine, Ser: 1.12 mg/dL (ref 0.40–1.50)
GFR: 78.99 mL/min (ref >60.00)
Glucose, Bld: 82 mg/dL (ref 70–99)
Potassium: 3.7 mEq/L (ref 3.5–5.1)
Sodium: 138 mEq/L (ref 135–145)
Total Bilirubin: 0.6 mg/dL (ref 0.2–1.2)
Total Protein: 7.8 g/dL (ref 6.0–8.3)

## 2022-09-17 LAB — LIPID PANEL
Cholesterol: 137 mg/dL (ref 0–200)
HDL: 39.7 mg/dL (ref >39.00)
LDL Cholesterol: 77 mg/dL (ref 0–99)
NonHDL: 97.1
Total CHOL/HDL Ratio: 3
Triglycerides: 101 mg/dL (ref 0.0–149.0)
VLDL: 20.2 mg/dL (ref 0.0–40.0)

## 2022-09-17 LAB — IBC + FERRITIN
Ferritin: 61.6 ng/mL (ref 22.0–322.0)
Iron: 82 ug/dL (ref 42–165)
Saturation Ratios: 18.2 % — ABNORMAL LOW (ref 20.0–50.0)
TIBC: 449.4 ug/dL (ref 250.0–450.0)
Transferrin: 321 mg/dL (ref 212.0–360.0)

## 2022-09-17 LAB — VITAMIN B12: Vitamin B-12: 456 pg/mL (ref 211–911)

## 2022-09-17 LAB — VITAMIN D 25 HYDROXY (VIT D DEFICIENCY, FRACTURES): VITD: 23.74 ng/mL — ABNORMAL LOW (ref 30.00–100.00)

## 2022-09-17 MED ORDER — ATORVASTATIN CALCIUM 10 MG PO TABS: 10.0000 mg | ORAL_TABLET | Freq: Every day | ORAL | 3 refills | Status: DC

## 2022-09-17 MED ORDER — AMLODIPINE BESYLATE 5 MG PO TABS: 5.0000 mg | ORAL_TABLET | Freq: Every day | ORAL | 3 refills | Status: DC

## 2022-09-17 MED ORDER — CHOLECALCIFEROL 1.25 MG (50000 UT) PO CAPS: 50000.0000 [IU] | ORAL_CAPSULE | ORAL | 1 refills | Status: DC

## 2022-09-17 MED ORDER — LOSARTAN POTASSIUM-HCTZ 100-25 MG PO TABS: 1.0000 | ORAL_TABLET | Freq: Every day | ORAL | 3 refills | Status: DC

## 2022-09-17 NOTE — Progress Notes (Signed)
Chief Complaint  Patient presents with   Follow-up   F/u  1. Htn on norvasc 5 mg qd hyzaar hctz 100-25 mg qd controlled hld on lipitor 10 mg qhs   2. H/o prediabetes/dm check labs today he stopped ozempic due to backorder working out had nutritionist    Review of Systems  Constitutional:  Negative for weight loss.  HENT:  Negative for hearing loss.   Eyes:  Negative for blurred vision.  Respiratory:  Negative for shortness of breath.   Cardiovascular:  Negative for chest pain.  Gastrointestinal:  Negative for abdominal pain and blood in stool.  Genitourinary:  Negative for dysuria.  Musculoskeletal:  Negative for back pain, falls and joint pain.  Skin:  Negative for rash.  Neurological:  Negative for headaches.  Psychiatric/Behavioral:  Negative for depression.    Past Medical History:  Diagnosis Date   Arthritis    hands, neck, back, knees    Atrial fibrillation ([place])    Dysrhythmia    Afib   Hyperlipidemia    Hypertension    Pre-diabetes    Prediabetes    Past Surgical History:  Procedure Laterality Date   NO PAST SURGERIES     TOTAL HIP ARTHROPLASTY Right 01/10/2021   Procedure: TOTAL HIP ARTHROPLASTY ANTERIOR APPROACH;  Surgeon: Rod Can, MD;  Location: [facility];  Service: Orthopedics;  Laterality: Right;   Family History  Problem Relation Age of Onset   Arthritis Mother    Hyperlipidemia Mother    Stroke Mother    Stroke Cousin        died 2022-09-18 of stroke 30   Mental illness Neg Hx    Social History   Socioeconomic History   Marital status: Divorced    Spouse name: Not on file   Number of children: 4   Years of education: Not on file   Highest education level: Associate degree: academic program  Occupational History   Not on file  Tobacco Use   Smoking status: Former    Types: Cigarettes    Quit date: 05/29/2016    Years since quitting: 6.3   Smokeless tobacco: Never  Vaping Use   Vaping Use: Never used  Substance and Sexual Activity    Alcohol use: Not Currently    Comment: socially   Drug use: No   Sexual activity: Yes    Birth control/protection: Condom  Other Topics Concern   Not on file  Social History Narrative   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 [place] Strain: Brush  (09/06/2019)   Overall Financial Resource Strain (CARDIA)    Difficulty of Paying Living Expenses: Not hard at all  Food Insecurity: No [place] (09/06/2019)   Hunger Vital Sign    Worried About Running Out of Food in the Last Year: Never true    [place] in the Last Year: Never true  Transportation Needs: No Transportation Needs (09/06/2019)   PRAPARE - Hydrologist (Medical): No    Lack of Transportation (Non-Medical): No  Physical Activity: Insufficiently Active (09/06/2019)   Exercise Vital Sign    Days of Exercise per Week: 3 days    Minutes of Exercise per Session: 30 min  Stress: No Stress Concern Present (09/06/2019)   [place]    Feeling of Stress : Not at all  Social Connections:  Unknown (09/06/2019)   Social Connection and Isolation Panel [NHANES]    Frequency of Communication with Friends and Family: Not on file    Frequency of Social Gatherings with Friends and Family: Not on file    Attends Religious Services: Never    Active Member of Clubs or Organizations: No    Attends Archivist Meetings: Never    Marital Status: Divorced  Human resources officer Violence: Not At Risk (09/06/2019)   Humiliation, Afraid, Rape, and Kick questionnaire    Fear of Current or Ex-Partner: No    Emotionally Abused: No    Physically Abused: No    Sexually Abused: No   No outpatient medications have been marked as taking for the 09/17/22 encounter (Office Visit) with McLean-Scocuzza, Nino Glow, MD.   No Known Allergies No results found for this or any previous visit  (from the past 2160 hour(s)). Objective  Body mass index is 39.61 kg/m. Wt Readings from Last 3 Encounters:  09/17/22 (!) 325 lb 6.4 oz (147.6 kg)  11/29/21 (!) 336 lb 12.8 oz (152.8 kg)  09/01/21 (!) 325 lb (147.4 kg)   Temp Readings from Last 3 Encounters:  09/17/22 98.2 F (36.8 C) (Oral)  11/29/21 (!) 97.2 F (36.2 C) (Temporal)  11/19/21 98.4 F (36.9 C) (Oral)   BP Readings from Last 3 Encounters:  09/17/22 124/64  11/29/21 134/86  11/19/21 (!) 151/89   Pulse Readings from Last 3 Encounters:  09/17/22 87  11/29/21 100  11/19/21 (!) 113    Physical Exam Vitals and nursing note reviewed.  Constitutional:      Appearance: Normal appearance. He is well-developed and well-groomed.  HENT:     Head: Normocephalic and atraumatic.  Eyes:     Conjunctiva/sclera: Conjunctivae normal.     Pupils: Pupils are equal, round, and reactive to light.  Cardiovascular:     Rate and Rhythm: Normal rate and regular rhythm.     Heart sounds: Normal heart sounds.  Pulmonary:     Effort: Pulmonary effort is normal. No respiratory distress.     Breath sounds: Normal breath sounds.  Abdominal:     Tenderness: There is no abdominal tenderness.  Skin:    General: Skin is warm and moist.  Neurological:     General: No focal deficit present.     Mental Status: He is alert and oriented to person, place, and time. Mental status is at baseline.     Sensory: Sensation is intact.     Motor: Motor function is intact.     Coordination: Coordination is intact.     Gait: Gait is intact. Gait normal.  Psychiatric:        Attention and Perception: Attention and perception normal.        Mood and Affect: Mood and affect normal.        Speech: Speech normal.        Behavior: Behavior normal. Behavior is cooperative.        Thought Content: Thought content normal.        Cognition and Memory: Cognition and memory normal.        Judgment: Judgment normal.     Assessment  Plan  Essential  hypertension controlled- Plan: Comprehensive metabolic panel, Lipid panel, CBC with Differential/Platelet, Microalbumin / creatinine urine ratio, amLODipine (NORVASC) 5 MG tablet, losartan-hydrochlorothiazide (HYZAAR) 100-25 MG tablet  Hyperlipidemia, unspecified hyperlipidemia type - Plan: Lipid panel lipitor 10 mg qhs  Prediabetes - Plan: Hemoglobin A1c, Microalbumin / creatinine urine ratio  Vitamin D deficiency - Plan: Vitamin D (25 hydroxy), Cholecalciferol 1.25 MG (50000 UT) capsule   HM Declines flu shot  Tdap utd Will given hep B1/3 doses  -had new hep B vaccines 2/2 hep B immune  Hep C neg Check titer  Prevnar 20 due 11/29/2026 Prevnar utd pna 23 vaccine utd  MMR immune  covid vaccine had 2/2 pfizer consider booster   psa check 45 02/07/22 0.20 rec PSA 50 and above   Colonoscopy screen start 88 [facility] congrat smoking and etoh cessation  rec healthy diet and exercise  Former smoker   Provider: Dr. Olivia Mackie McLean-Scocuzza-Internal Medicine

## 2022-09-17 NOTE — Patient Instructions (Signed)
[facility] Phone Fax E-mail Address  [phone] Not available [address]   [place] [address]     Specialties     Gastroenterology      Colonoscopy, Adult A colonoscopy is a procedure to look at the entire large intestine. This procedure is done using a long, thin, flexible tube that has a camera on the end. You may have a colonoscopy: As a part of normal colorectal screening. If you have certain symptoms, such as: A low number of red blood cells in your blood (anemia). Diarrhea that does not go away. Pain in your abdomen. Blood in your stool. A colonoscopy can help screen for and diagnose medical problems, including: An abnormal growth of cells or tissue (tumor). Abnormal growths within the lining of your intestine (polyps). Inflammation. Areas of bleeding. Tell your health care provider about: Any allergies you have. All medicines you are taking, including vitamins, herbs, eye drops, creams, and over-the-counter medicines. Any problems you or family members have had with anesthetic medicines. Any bleeding problems you have. Any surgeries you have had. Any medical conditions you have. Any problems you have had with having bowel movements. Whether you are pregnant or may be pregnant. What are the risks? Generally, this is a safe procedure. However, problems may occur, including: Bleeding. Damage to your intestine. Allergic reactions to medicines given during the procedure. Infection. This is rare. What happens before the procedure? Eating and drinking restrictions Follow instructions from your health care provider about eating or drinking restrictions, which may include: A few days before the procedure: Follow a low-fiber diet. Avoid nuts, seeds, dried fruit, raw fruits, and vegetables. 1-3 days before the procedure: Eat only gelatin dessert or ice pops. Drink only clear liquids, such as water, clear juice, clear broth or bouillon, black  coffee or tea, or clear soft drinks or sports drinks. Avoid liquids that contain red or purple dye. The day of the procedure: Do not eat solid foods. You may continue to drink clear liquids until up to 2 hours before the procedure. Do not eat or drink anything starting 2 hours before the procedure, or within the time period that your health care provider recommends. Bowel prep If you were prescribed a bowel prep to take by mouth (orally) to clean out your colon: Take it as told by your health care provider. Starting the day before your procedure, you will need to drink a large amount of liquid medicine. The liquid will cause you to have many bowel movements of loose stool until your stool becomes almost clear or light green. If your skin or the opening between the buttocks (anus) gets irritated from diarrhea, you may relieve the irritation using: Wipes with medicine in them, such as adult wet wipes with aloe and vitamin E. A product to soothe skin, such as petroleum jelly. If you vomit while drinking the bowel prep: Take a break for up to 60 minutes. Begin the bowel prep again. Call your health care provider if you keep vomiting or you cannot take the bowel prep without vomiting. To clean out your colon, you may also be given: Laxative medicines. These help you have a bowel movement. Instructions for enema use. An enema is liquid medicine injected into your rectum. Medicines Ask your health care provider about: Changing or stopping your regular medicines or supplements. This is especially important if you are taking iron supplements, diabetes medicines, or blood thinners. Taking medicines such as aspirin and ibuprofen. These medicines can thin  your blood. Do not take these medicines unless your health care provider tells you to take them. Taking over-the-counter medicines, vitamins, herbs, and supplements. General instructions Ask your health care provider what steps will be taken to help  prevent infection. These may include washing skin with a germ-killing soap. If you will be going home right after the procedure, plan to have a responsible adult: Take you home from the hospital or clinic. You will not be allowed to drive. Care for you for the time you are told. What happens during the procedure?  An IV will be inserted into one of your veins. You will be given a medicine to make you fall asleep (general anesthetic). You will lie on your side with your knees bent. A lubricant will be put on the tube. Then the tube will be: Inserted into your anus. Gently eased through all parts of your large intestine. Air will be sent into your colon to keep it open. This may cause some pressure or cramping. Images will be taken with the camera and will appear on a screen. A small tissue sample may be removed to be looked at under a microscope (biopsy). The tissue may be sent to a lab for testing if any signs of problems are found. If small polyps are found, they may be removed and checked for cancer cells. When the procedure is finished, the tube will be removed. The procedure may vary among health care providers and hospitals. What happens after the procedure? Your blood pressure, heart rate, breathing rate, and blood oxygen level will be monitored until you leave the hospital or clinic. You may have a small amount of blood in your stool. You may pass gas and have mild cramping or bloating in your abdomen. This is caused by the air that was used to open your colon during the exam. If you were given a sedative during the procedure, it can affect you for several hours. Do not drive or operate machinery until your health care provider says that it is safe. It is up to you to get the results of your procedure. Ask your health care provider, or the department that is doing the procedure, when your results will be ready. Summary A colonoscopy is a procedure to look at the entire large  intestine. Follow instructions from your health care provider about eating and drinking before the procedure. If you were prescribed an oral bowel prep to clean out your colon, take it as told by your health care provider. During the colonoscopy, a flexible tube with a camera on its end is inserted into the anus and then passed into all parts of the large intestine. This information is not intended to replace advice given to you by your health care provider. Make sure you discuss any questions you have with your health care provider. Document Revised: 11/25/2021 Document Reviewed: 07/24/2021 Elsevier Patient Education  [place].

## 2022-09-18 LAB — MICROALBUMIN / CREATININE URINE RATIO
Creatinine, Urine: 154 mg/dL (ref 20–320)
Microalb Creat Ratio: 6 mcg/mg creat (ref <30)
Microalb, Ur: 0.9 mg/dL

## 2022-09-18 LAB — URINALYSIS, ROUTINE W REFLEX MICROSCOPIC
Bilirubin Urine: NEGATIVE
Glucose, UA: NEGATIVE
Hgb urine dipstick: NEGATIVE
Ketones, ur: NEGATIVE
Leukocytes,Ua: NEGATIVE
Nitrite: NEGATIVE
Protein, ur: NEGATIVE
Specific Gravity, Urine: 1.019 (ref 1.001–1.035)
pH: 6 (ref 5.0–8.0)

## 2022-10-20 ENCOUNTER — Telehealth: Payer: Self-pay | Admitting: Internal Medicine

## 2022-10-20 NOTE — Telephone Encounter (Signed)
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 The pt has to have a physical done for the job but the pt BP level is  elevated to 157/100 and 149/101. Roselyn Reef wanted to make an appointment for pt but I sent him to access nurse

## 2022-10-21 ENCOUNTER — Telehealth: Payer: Self-pay

## 2022-10-21 NOTE — Telephone Encounter (Signed)
Pt called stating he was returning Marisa's phone call.  I did not she a telephone note in the chart, and pt stated he did not know why she called.  Luna Fuse was unavailable at the time of call.  Pt would like a CB.

## 2022-10-21 NOTE — Telephone Encounter (Signed)
Noted. Please confirm no acute issues and ok to wait until scheduled appt.

## 2022-10-21 NOTE — Telephone Encounter (Signed)
Charles Huang - [place] RECORD AccessNurse Patient Name: Charles Huang [facility] Gender: Male DOB: [birth year] Age: 46 Y 2 M 23 D Return Phone Number: [phone] (Primary) Address: City/ State/ Zip: [place] [address] Client Charles Huang Client Site [place] - Huang Provider McClean-Scocuzza, Olivia Mackie Contact Type Call Who Is Calling Patient / Member / Family / Caregiver Call Type Triage / Clinical Relationship To Patient Self Return Phone Number [phone] (Primary) Chief Complaint Blood Pressure High Reason for Call Symptomatic / Request for [place] states the pt has high BP(157/100). Marylen Ponto in an RN that is with the pt on the phone. He is trying to get a job and they found that it is still high with BP. Translation No Nurse Assessment Nurse: Tito Dine, RN, Neoma Laming Date/Time Eilene Ghazi Time): 10/20/2022 3:43:00 PM Confirm and document reason for call. If symptomatic, describe symptoms. ---Caller states the pt has high BP(157/100). Marylen Ponto RN that is with the pt on the phone states that he was sent to have a physical exam for employee but because of his B/P been out of range for their guidelines they need to re-evaluate him. Pt is taking his BP medications Losartan-Hydrochlorothiazide 100-25 mg one time a Huang. Caller is requesting to be seen by the Office as soon as possible so he can pass his employment screening. Denies SOB, chest pain or other symptoms. Does the patient have any new or worsening symptoms? ---Yes Will a triage be completed? ---Yes Related visit to physician within the last 2 weeks? ---Yes Does the PT have any chronic conditions? (i.e. diabetes, asthma, this includes High risk factors for pregnancy, etc.) ---Yes List chronic conditions. ---HTN Is this a behavioral health or substance  abuse call? ---No Guidelines Guideline Title Affirmed Question Affirmed Notes Nurse Date/Time (Eastern Time) Blood Pressure - High Systolic BP >= 419 OR Diastolic >= 379 Tito Dine, RN, Neoma Laming 10/20/2022 3:53:57 PM Disp. Time Eilene Ghazi Time) Disposition Final User 10/20/2022 3:58:13 PM SEE PCP WITHIN 3 DAYS Yes Tito Dine, RN, Deborah Final Disposition 10/20/2022 3:58:13 PM SEE PCP WITHIN 3 DAYS Yes Tito Dine, RN, Garrel Ridgel Disagree/Comply Comply Caller Understands Yes PreDisposition Call Doctor Care Advice Given Per Guideline SEE PCP WITHIN 3 DAYS: * You need to be seen within 2 or 3 days. * EAT HEALTHY: Eat a diet rich in fresh fruits and vegetables, dietary fiber, non-animal protein (e.g., soy), and low-fat dairy products. Avoid foods with a high content of saturated fat or cholesterol. HIGH BLOOD PRESSURE - LIFESTYLE MODIFICATIONS: * DECREASE SODIUM INTAKE: Aim to eat less than 2.4 g (100 mmol) of sodium each Huang. Unfortunately 75% of the salt in the average person's diet is in pre-processed foods. CALL BACK IF: * Weakness or numbness of the face, arm or leg on one side of the body occurs * Difficulty walking, difficulty talking, or severe headache occurs * Chest pain or difficulty breathing occurs * Your blood pressure is over 180/110 * You become worse CARE ADVICE given per High Blood Pressure (Adult) guideline. Comments User: Nelwyn Salisbury, RN Date/Time Eilene Ghazi Time): 10/20/2022 4:01:50 PM Warm transferred patient to backline for appt. Referrals REFERRED TO PCP OFFICE

## 2022-10-21 NOTE — Telephone Encounter (Signed)
LM for pt to cb 

## 2022-10-21 NOTE — Telephone Encounter (Signed)
Pt has an appt with you on 10/23/22 his access nurse note has been attached.

## 2022-10-23 ENCOUNTER — Ambulatory Visit (INDEPENDENT_AMBULATORY_CARE_PROVIDER_SITE_OTHER): Payer: BLUE CROSS/BLUE SHIELD | Admitting: Internal Medicine

## 2022-10-23 ENCOUNTER — Encounter: Payer: Self-pay | Admitting: Internal Medicine

## 2022-10-23 VITALS — BP 140/98 | HR 80 | Temp 98.2°F | Resp 17 | Ht 76.0 in | Wt 331.6 lb

## 2022-10-23 DIAGNOSIS — I480 Paroxysmal atrial fibrillation: Secondary | ICD-10-CM

## 2022-10-23 DIAGNOSIS — E785 Hyperlipidemia, unspecified: Secondary | ICD-10-CM

## 2022-10-23 DIAGNOSIS — Z136 Encounter for screening for cardiovascular disorders: Secondary | ICD-10-CM

## 2022-10-23 DIAGNOSIS — I10 Essential (primary) hypertension: Secondary | ICD-10-CM

## 2022-10-23 DIAGNOSIS — R7303 Prediabetes: Secondary | ICD-10-CM | POA: Diagnosis not present

## 2022-10-23 MED ORDER — AMLODIPINE BESYLATE 10 MG PO TABS: 10.0000 mg | ORAL_TABLET | Freq: Every day | ORAL | 2 refills | Status: DC

## 2022-10-23 NOTE — Progress Notes (Signed)
Patient ID: Charles Huang, male   DOB: 11/06/1976, 46 y.o.   MRN: [id number]   Subjective:    Patient ID: Charles Huang, male    DOB: [birth year], 46 y.o.   MRN: [id number]    Patient here for  Chief Complaint  Patient presents with   Follow-up   Hypertension   .   HPI Here for work in appt.  Work in for elevated blood pressure. Pt of [employer]. Had physical exam for work and blood pressure was noted to be elevated.  Currently on losartan/hctz and amlodipine. Tries to stay active.  Reports no chest pain with increased activity or exertion.  Has been exercising.  Trying to watch his diet.  No nausea or vomiting reported.  No abdominal pain or bowel change reported.  Does report a previous history of afib.  In reviewing records, had documented afib in 2017. Was evaluated in ER.  Per note, saw cardiology - on eliquis for a brief period.  Off eliquis now for years.  No increased heart rate or palpitations reported.  Family history of CVA.  Had cousin recently died in 56s.     Past Medical History:  Diagnosis Date   Arthritis    hands, neck, back, knees    Atrial fibrillation ([place])    Dysrhythmia    Afib   Hyperlipidemia    Hypertension    Pre-diabetes    Prediabetes    Past Surgical History:  Procedure Laterality Date   NO PAST SURGERIES     TOTAL HIP ARTHROPLASTY Right 01/10/2021   Procedure: TOTAL HIP ARTHROPLASTY ANTERIOR APPROACH;  Surgeon: Rod Can, MD;  Location: [facility];  Service: Orthopedics;  Laterality: Right;   Family History  Problem Relation Age of Onset   Arthritis Mother    Hyperlipidemia Mother    Stroke Mother    Stroke Cousin        died 09/20/2022 of stroke 57   Mental illness Neg Hx    Social History   Socioeconomic History   Marital status: Divorced    Spouse name: Not on file   Number of children: 4   Years of education: Not on file   Highest education level: Associate degree: academic program  Occupational History   Not on file  Tobacco Use   Smoking  status: Former    Types: Cigarettes    Quit date: 05/29/2016    Years since quitting: 6.4   Smokeless tobacco: Never  Vaping Use   Vaping Use: Never used  Substance and Sexual Activity   Alcohol use: Not Currently    Comment: socially   Drug use: No   Sexual activity: Yes    Birth control/protection: Condom  Other Topics Concern   Not on file  Social History Narrative   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 [place] Strain: [place]  (09/06/2019)   Overall Financial Resource Strain (CARDIA)    Difficulty of Paying Living Expenses: Not hard at all  Food Insecurity: No [place] (09/06/2019)   Hunger Vital Sign    Worried About Running Out of Food in the Last Year: Never true    [place] in the Last Year: Never true  Transportation Needs: No Transportation Needs (09/06/2019)   PRAPARE - Hydrologist (Medical): No    Lack of Transportation (Non-Medical): No  Physical Activity: Insufficiently Active (09/06/2019)  Exercise Vital Sign    Days of Exercise per Week: 3 days    Minutes of Exercise per Session: 30 min  Stress: No Stress Concern Present (09/06/2019)   [place]    Feeling of Stress : Not at all  Social Connections: Unknown (09/06/2019)   Social Connection and Isolation Panel [NHANES]    Frequency of Communication with Friends and Family: Not on file    Frequency of Social Gatherings with Friends and Family: Not on file    Attends Religious Services: Never    Marine scientist or Organizations: No    Attends Archivist Meetings: Never    Marital Status: Divorced     Review of Systems  Constitutional:  Negative for appetite change and unexpected weight change.  HENT:  Negative for congestion and sinus pressure.   Respiratory:  Negative for cough and chest tightness.        Breathing  stable.   Cardiovascular:  Negative for chest pain, palpitations and leg swelling.  Gastrointestinal:  Negative for abdominal pain, diarrhea, nausea and vomiting.  Genitourinary:  Negative for difficulty urinating and dysuria.  Musculoskeletal:  Negative for joint swelling and myalgias.  Skin:  Negative for color change and rash.  Neurological:  Negative for dizziness and headaches.  Psychiatric/Behavioral:  Negative for agitation and dysphoric mood.        Objective:     BP (!) 140/98 (BP Location: Left Arm, Patient Position: Sitting, Cuff Size: Large)   Pulse 80   Temp 98.2 F (36.8 C) (Temporal)   Resp 17   Ht 6' 4" (1.93 m)   Wt (!) 331 lb 9.6 oz (150.4 kg)   SpO2 97%   BMI 40.36 kg/m  Wt Readings from Last 3 Encounters:  10/23/22 (!) 331 lb 9.6 oz (150.4 kg)  09/17/22 (!) 325 lb 6.4 oz (147.6 kg)  11/29/21 (!) 336 lb 12.8 oz (152.8 kg)    Physical Exam Vitals reviewed.  Constitutional:      General: He is not in acute distress.    Appearance: Normal appearance. He is well-developed.  HENT:     Head: Normocephalic and atraumatic.     Right Ear: External ear normal.     Left Ear: External ear normal.  Eyes:     General: No scleral icterus.       Right eye: No discharge.        Left eye: No discharge.     Conjunctiva/sclera: Conjunctivae normal.  Cardiovascular:     Rate and Rhythm: Normal rate and regular rhythm.  Pulmonary:     Effort: Pulmonary effort is normal. No respiratory distress.     Breath sounds: Normal breath sounds.  Abdominal:     General: Bowel sounds are normal.     Palpations: Abdomen is soft.     Tenderness: There is no abdominal tenderness.  Musculoskeletal:        General: No swelling or tenderness.     Cervical back: Neck supple. No tenderness.  Lymphadenopathy:     Cervical: No cervical adenopathy.  Skin:    Findings: No erythema or rash.  Neurological:     Mental Status: He is alert.  Psychiatric:        Mood and Affect: Mood  normal.        Behavior: Behavior normal.      Outpatient Encounter Medications as of 10/23/2022  Medication Sig   amLODipine (NORVASC) 10 MG  tablet Take 1 tablet (10 mg total) by mouth daily.   atorvastatin (LIPITOR) 10 MG tablet Take 1 tablet (10 mg total) by mouth daily. After 6 pm   Cholecalciferol 1.25 MG (50000 UT) capsule Take 1 capsule (50,000 Units total) by mouth once a week.   losartan-hydrochlorothiazide (HYZAAR) 100-25 MG tablet Take 1 tablet by mouth daily. At night   [DISCONTINUED] amLODipine (NORVASC) 5 MG tablet Take 1 tablet (5 mg total) by mouth daily.   No facility-administered encounter medications on file as of 10/23/2022.     Lab Results  Component Value Date   WBC 6.5 09/17/2022   HGB 14.6 09/17/2022   HCT 44.5 09/17/2022   PLT 250.0 09/17/2022   GLUCOSE 82 09/17/2022   CHOL 137 09/17/2022   TRIG 101.0 09/17/2022   HDL 39.70 09/17/2022   LDLCALC 77 09/17/2022   ALT 35 09/17/2022   AST 35 09/17/2022   NA 138 09/17/2022   K 3.7 09/17/2022   CL 100 09/17/2022   CREATININE 1.12 09/17/2022   BUN 22 09/17/2022   CO2 29 09/17/2022   TSH 1.69 02/07/2022   PSA 0.20 02/07/2022   INR 1.0 01/07/2021   HGBA1C 6.1 09/17/2022   MICROALBUR 0.9 09/17/2022    US Abdomen Limited RUQ (LIVER/GB)  Result Date: 11/19/2021 CLINICAL DATA:  Right upper quadrant pain for a few days following a fall EXAM: ULTRASOUND ABDOMEN LIMITED RIGHT UPPER QUADRANT COMPARISON:  None. FINDINGS: Gallbladder: The gallbladder is contracted. No gallstones or pericholecystic fluid is seen. Sonographic Murphy's sign was reported negative. Common bile duct: Diameter: 2 mm Liver: Parenchymal echogenicity is diffusely increased and heterogeneous likely reflecting mild fatty infiltration with areas of sparing. No focal lesions are seen. Portal vein is patent on color Doppler imaging with normal direction of blood flow towards the liver. Other: None. IMPRESSION: Mild fatty infiltration of the liver.  Otherwise, unremarkable right upper quadrant ultrasound. Electronically Signed   By: Valetta Mole M.D.   On: 11/19/2021 14:33   DG Ribs Unilateral W/Chest Right  Result Date: 11/19/2021 CLINICAL DATA:  Fall, pain in lower axillary ribs EXAM: RIGHT RIBS AND CHEST - 3+ VIEW COMPARISON:  Chest radiograph 03/08/2019 FINDINGS: The cardiomediastinal silhouette is normal. There is no focal consolidation or pulmonary edema. There is no pleural effusion or pneumothorax No displaced rib fracture or other acute osseous abnormality is identified at the indicated site of pain. IMPRESSION: No displaced rib fracture or other acute osseous abnormality identified at the indicated site of pain. Electronically Signed   By: Valetta Mole M.D.   On: 11/19/2021 11:59       Assessment & Plan:   Problem List Items Addressed This Visit     Encounter for screening for coronary artery disease    Discussed risk factors.  Discussed further cardiac evaluation.  Discussed screening, calcium score, etc.  Also discussed further w/up given history of afib.  Agreeable for cardiology referral.        Relevant Orders   Ambulatory referral to Cardiology   Essential hypertension - Primary    Blood pressure elevated.  Found to be elevated during an exam for work.  Remains elevated.  Outside checks reviewed - 146/94, 141/92, 144/99.  Continues on losartan/hctz 100/12.5 and amlodipine 4m q day.  Will increase amlodipine to 165mq day.  Continue losartan/hctz.  Follow pressures.  Get him back in soon to recheck.  Have him spot check pressures.       Relevant Medications   amLODipine (NORVASC)  10 MG tablet   Hyperlipidemia    Continue lipitor.  Low cholesterol diet and exercise.  Follow lipid panel and liver function tests.       Relevant Medications   amLODipine (NORVASC) 10 MG tablet   Paroxysmal A-fib (HCC)    Appears to be in SR.  Documented history of afib - 2017 ER visit.  Off eliquis.  Has been off for years.  Unclear of  previous w/up or evaluation.  Discussed referral to cardiology as outlined for further cardiac screening/evaluation/testing.        Relevant Medications   amLODipine (NORVASC) 10 MG tablet   Other Relevant Orders   Ambulatory referral to Cardiology   Prediabetes    Recent A1c 6.1.  continue low carb diet and exercise.  Follow met b and A1c.          Einar Pheasant, MD

## 2022-11-03 ENCOUNTER — Encounter: Payer: Self-pay | Admitting: Internal Medicine

## 2022-11-03 DIAGNOSIS — Z136 Encounter for screening for cardiovascular disorders: Secondary | ICD-10-CM | POA: Insufficient documentation

## 2022-11-03 NOTE — Assessment & Plan Note (Signed)
Discussed risk factors.  Discussed further cardiac evaluation.  Discussed screening, calcium score, etc.  Also discussed further w/up given history of afib.  Agreeable for cardiology referral.

## 2022-11-03 NOTE — Assessment & Plan Note (Signed)
Continue lipitor.  Low cholesterol diet and exercise.  Follow lipid panel and liver function tests.   

## 2022-11-03 NOTE — Assessment & Plan Note (Signed)
Appears to be in SR.  Documented history of afib - 2017 ER visit.  Off eliquis.  Has been off for years.  Unclear of previous w/up or evaluation.  Discussed referral to cardiology as outlined for further cardiac screening/evaluation/testing.

## 2022-11-03 NOTE — Assessment & Plan Note (Signed)
Blood pressure elevated.  Found to be elevated during an exam for work.  Remains elevated.  Outside checks reviewed - 146/94, 141/92, 144/99.  Continues on losartan/hctz 100/12.5 and amlodipine 5mg  q day.  Will increase amlodipine to 10mg  q day.  Continue losartan/hctz.  Follow pressures.  Get him back in soon to recheck.  Have him spot check pressures.

## 2022-11-03 NOTE — Assessment & Plan Note (Signed)
Recent A1c 6.1.  continue low carb diet and exercise.  Follow met b and A1c.

## 2022-11-21 ENCOUNTER — Ambulatory Visit (INDEPENDENT_AMBULATORY_CARE_PROVIDER_SITE_OTHER): Payer: BLUE CROSS/BLUE SHIELD | Admitting: Internal Medicine

## 2022-11-21 DIAGNOSIS — I10 Essential (primary) hypertension: Secondary | ICD-10-CM

## 2022-11-21 NOTE — Progress Notes (Unsigned)
Patient ID: Charles Huang, male   DOB: 09-08-1976, 46 y.o.   MRN: [id number]   Subjective:    Patient ID: Charles Huang, male    DOB: [birth year], 46 y.o.   MRN: [id number]   Patient here for No chief complaint on file.  Marland Kitchen   HPI Here to follow up regarding his blood pressure.    Past Medical History:  Diagnosis Date   Arthritis    hands, neck, back, knees    Atrial fibrillation (HCC)    Dysrhythmia    Afib   Hyperlipidemia    Hypertension    Pre-diabetes    Prediabetes    Past Surgical History:  Procedure Laterality Date   NO PAST SURGERIES     TOTAL HIP ARTHROPLASTY Right 01/10/2021   Procedure: TOTAL HIP ARTHROPLASTY ANTERIOR APPROACH;  Surgeon: Samson Frederic, MD;  Location: [facility];  Service: Orthopedics;  Laterality: Right;   Family History  Problem Relation Age of Onset   Arthritis Mother    Hyperlipidemia Mother    Stroke Mother    Stroke Cousin        died 2022/09/20 of stroke 58   Mental illness Neg Hx    Social History   Socioeconomic History   Marital status: Divorced    Spouse name: Not on file   Number of children: 4   Years of education: Not on file   Highest education level: Associate degree: academic program  Occupational History   Not on file  Tobacco Use   Smoking status: Former    Types: Cigarettes    Quit date: 05/29/2016    Years since quitting: 6.4   Smokeless tobacco: Never  Vaping Use   Vaping Use: Never used  Substance and Sexual Activity   Alcohol use: Not Currently    Comment: socially   Drug use: No   Sexual activity: Yes    Birth control/protection: Condom  Other Topics Concern   Not on file  Social History Narrative   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 [employer]    Social Determinants of Health   Financial Resource Strain: Low Risk  (09/06/2019)   Overall Financial Resource Strain (CARDIA)    Difficulty of Paying Living Expenses: Not hard at all  Food Insecurity: No Food Insecurity (09/06/2019)   Hunger Vital Sign     Worried About Running Out of Food in the Last Year: Never true    Ran Out of Food in the Last Year: Never true  Transportation Needs: No Transportation Needs (09/06/2019)   PRAPARE - Administrator, Civil Service (Medical): No    Lack of Transportation (Non-Medical): No  Physical Activity: Insufficiently Active (09/06/2019)   Exercise Vital Sign    Days of Exercise per Week: 3 days    Minutes of Exercise per Session: 30 min  Stress: No Stress Concern Present (09/06/2019)   [employer] of Occupational Health - Occupational Stress Questionnaire    Feeling of Stress : Not at all  Social Connections: Unknown (09/06/2019)   Social Connection and Isolation Panel [NHANES]    Frequency of Communication with Friends and Family: Not on file    Frequency of Social Gatherings with Friends and Family: Not on file    Attends Religious Services: Never    Active Member of Clubs or Organizations: No    Attends Banker Meetings: Never    Marital Status: Divorced     Review of Systems  Objective:     There were no vitals taken for this visit. Wt Readings from Last 3 Encounters:  10/23/22 (!) 331 lb 9.6 oz (150.4 kg)  09/17/22 (!) 325 lb 6.4 oz (147.6 kg)  11/29/21 (!) 336 lb 12.8 oz (152.8 kg)    Physical Exam   Outpatient Encounter Medications as of 11/21/2022  Medication Sig   amLODipine (NORVASC) 10 MG tablet Take 1 tablet (10 mg total) by mouth daily.   atorvastatin (LIPITOR) 10 MG tablet Take 1 tablet (10 mg total) by mouth daily. After 6 pm   Cholecalciferol 1.25 MG (50000 UT) capsule Take 1 capsule (50,000 Units total) by mouth once a week.   losartan-hydrochlorothiazide (HYZAAR) 100-25 MG tablet Take 1 tablet by mouth daily. At night   No facility-administered encounter medications on file as of 11/21/2022.     Lab Results  Component Value Date   WBC 6.5 09/17/2022   HGB 14.6 09/17/2022   HCT 44.5 09/17/2022   PLT 250.0 09/17/2022   GLUCOSE 82  09/17/2022   CHOL 137 09/17/2022   TRIG 101.0 09/17/2022   HDL 39.70 09/17/2022   LDLCALC 77 09/17/2022   ALT 35 09/17/2022   AST 35 09/17/2022   NA 138 09/17/2022   K 3.7 09/17/2022   CL 100 09/17/2022   CREATININE 1.12 09/17/2022   BUN 22 09/17/2022   CO2 29 09/17/2022   TSH 1.69 02/07/2022   PSA 0.20 02/07/2022   INR 1.0 01/07/2021   HGBA1C 6.1 09/17/2022   MICROALBUR 0.9 09/17/2022    US Abdomen Limited RUQ (LIVER/GB)  Result Date: 11/19/2021 CLINICAL DATA:  Right upper quadrant pain for a few days following a fall EXAM: ULTRASOUND ABDOMEN LIMITED RIGHT UPPER QUADRANT COMPARISON:  None. FINDINGS: Gallbladder: The gallbladder is contracted. No gallstones or pericholecystic fluid is seen. Sonographic Murphy's sign was reported negative. Common bile duct: Diameter: 2 mm Liver: Parenchymal echogenicity is diffusely increased and heterogeneous likely reflecting mild fatty infiltration with areas of sparing. No focal lesions are seen. Portal vein is patent on color Doppler imaging with normal direction of blood flow towards the liver. Other: None. IMPRESSION: Mild fatty infiltration of the liver. Otherwise, unremarkable right upper quadrant ultrasound. Electronically Signed   By: Lesia Hausen M.D.   On: 11/19/2021 14:33   DG Ribs Unilateral W/Chest Right  Result Date: 11/19/2021 CLINICAL DATA:  Fall, pain in lower axillary ribs EXAM: RIGHT RIBS AND CHEST - 3+ VIEW COMPARISON:  Chest radiograph 03/08/2019 FINDINGS: The cardiomediastinal silhouette is normal. There is no focal consolidation or pulmonary edema. There is no pleural effusion or pneumothorax No displaced rib fracture or other acute osseous abnormality is identified at the indicated site of pain. IMPRESSION: No displaced rib fracture or other acute osseous abnormality identified at the indicated site of pain. Electronically Signed   By: Lesia Hausen M.D.   On: 11/19/2021 11:59       Assessment & Plan:   Problem List Items  Addressed This Visit   None    Dale Guernsey, MD

## 2022-11-22 ENCOUNTER — Encounter: Payer: Self-pay | Admitting: Internal Medicine

## 2022-11-22 NOTE — Progress Notes (Signed)
Patient ID: Charles Huang, male   DOB: 1976/05/05, 46 y.o.   MRN: [id number] Did not show for appt.

## 2022-12-16 ENCOUNTER — Encounter: Payer: Self-pay | Admitting: Internal Medicine

## 2022-12-17 NOTE — Telephone Encounter (Signed)
Given that the only time I saw him was to work in for his elevated blood pressure, I would hold on refilling oral diclofenac.  He can use voltaren gel if needs antiinflammatory.  Also if not allergic to tylenol, would recommend tylenol arthritis.  Does need a f/u appt regarding his blood pressure.

## 2022-12-18 ENCOUNTER — Other Ambulatory Visit: Payer: Self-pay | Admitting: Family

## 2022-12-18 DIAGNOSIS — G8929 Other chronic pain: Secondary | ICD-10-CM

## 2022-12-19 NOTE — Telephone Encounter (Signed)
If he calls back, please get a number and good times that I can call him.

## 2022-12-19 NOTE — Telephone Encounter (Signed)
I called Mr Charles Huang and left a message for him to call back to discuss.  Left message for him to leave times that would be good for him to be reached - so that I could contact him.

## 2022-12-22 NOTE — Telephone Encounter (Signed)
Patient called and needs his medication filled today.,Diclofenac Sodium 75

## 2022-12-22 NOTE — Telephone Encounter (Signed)
I spoke with the patient . He will be switching to Dr. Volanda Napoleon. Appointment scheduled for 12/23/22.

## 2022-12-22 NOTE — Telephone Encounter (Signed)
Patient returned Dr Bary Leriche phone call  [phone]. Patient is very unhappy that his medicatin is not filled.

## 2022-12-23 ENCOUNTER — Ambulatory Visit (INDEPENDENT_AMBULATORY_CARE_PROVIDER_SITE_OTHER): Payer: BLUE CROSS/BLUE SHIELD | Admitting: Family Medicine

## 2022-12-23 ENCOUNTER — Encounter: Payer: Self-pay | Admitting: Family Medicine

## 2022-12-23 VITALS — BP 132/86 | HR 93 | Temp 98.2°F | Ht 76.0 in | Wt 331.2 lb

## 2022-12-23 DIAGNOSIS — I10 Essential (primary) hypertension: Secondary | ICD-10-CM | POA: Diagnosis not present

## 2022-12-23 DIAGNOSIS — Z6841 Body Mass Index (BMI) 40.0 and over, adult: Secondary | ICD-10-CM

## 2022-12-23 DIAGNOSIS — M170 Bilateral primary osteoarthritis of knee: Secondary | ICD-10-CM

## 2022-12-23 DIAGNOSIS — E785 Hyperlipidemia, unspecified: Secondary | ICD-10-CM | POA: Diagnosis not present

## 2022-12-23 DIAGNOSIS — M160 Bilateral primary osteoarthritis of hip: Secondary | ICD-10-CM

## 2022-12-23 DIAGNOSIS — M1611 Unilateral primary osteoarthritis, right hip: Secondary | ICD-10-CM | POA: Diagnosis not present

## 2022-12-23 DIAGNOSIS — I480 Paroxysmal atrial fibrillation: Secondary | ICD-10-CM | POA: Diagnosis not present

## 2022-12-23 MED ORDER — CELECOXIB 50 MG PO CAPS: 50.0000 mg | ORAL_CAPSULE | Freq: Two times a day (BID) | ORAL | 0 refills | Status: DC

## 2022-12-23 MED ORDER — AMLODIPINE BESYLATE 10 MG PO TABS: 10.0000 mg | ORAL_TABLET | Freq: Every day | ORAL | 3 refills | Status: DC

## 2022-12-23 MED ORDER — PANTOPRAZOLE SODIUM 40 MG PO TBEC: 40.0000 mg | DELAYED_RELEASE_TABLET | Freq: Every day | ORAL | 0 refills | Status: DC

## 2022-12-23 MED ORDER — LOSARTAN POTASSIUM-HCTZ 100-25 MG PO TABS: 1.0000 | ORAL_TABLET | Freq: Every day | ORAL | 3 refills | Status: DC

## 2022-12-23 MED ORDER — ATORVASTATIN CALCIUM 10 MG PO TABS: 10.0000 mg | ORAL_TABLET | Freq: Every day | ORAL | 3 refills | Status: DC

## 2022-12-23 NOTE — Assessment & Plan Note (Signed)
Chronic.  BMI greater than 40.  Has been actively trying to lose weight.  A1c in 2019 6.5.  Patient is diabetic not currently on medication.  Recent A1c 6.1 well-controlled. Could consider GLP-1 given hypertension, hyperlipidemia and diabetic history. Plan to discussed with patient at next visit.

## 2022-12-23 NOTE — Progress Notes (Signed)
SUBJECTIVE:   Chief Complaint  Patient presents with   Establish Care    Transfer of Care   HPI Patient presents to clinic to transfer care.  Hypertension Asymptomatic.  Requesting refills for medications.  Currently taking Hyzaar 100-25 mg daily, amlodipine 10 mg daily.  Tolerating medications well.  Denies any headaches, visual changes, chest pain, shortness of breath or extremity edema.  Hip/knee pain Chronic issue.  Previous history of right hip surgery for severe osteoarthritis.  Awaiting surgery for left hip.  Has taken measures weight loss and works out 4 times week at gym.  Previously taking diclofenac 75 mg twice daily.  Open to changing medications.  PERTINENT PMH / PSH: Severe bilateral hip osteoarthritis Valgus knee bilaterally Status post total right hip arthroplasty, 01/22 Hypertension Hyperlipidemia Paroxysmal A-fib, previously on Eliquis   OBJECTIVE:  BP 132/86   Pulse 93   Temp 98.2 F (36.8 C)   Ht 6\' 4"  (1.93 m)   Wt (!) 331 lb 3.2 oz (150.2 kg)   SpO2 99%   BMI 40.31 kg/m    Physical Exam Vitals reviewed.  Constitutional:      General: He is not in acute distress.    Appearance: He is normal weight. He is not ill-appearing.  HENT:     Head: Normocephalic.  Eyes:     Conjunctiva/sclera: Conjunctivae normal.  Cardiovascular:     Rate and Rhythm: Normal rate and regular rhythm.     Pulses: Normal pulses.  Pulmonary:     Effort: Pulmonary effort is normal.     Breath sounds: Normal breath sounds.  Abdominal:     General: Bowel sounds are normal.  Musculoskeletal:        General: Deformity present.     Cervical back: Normal range of motion.     Right hip: Decreased range of motion.     Left hip: Decreased range of motion.     Right lower leg: No edema.     Left lower leg: No edema.  Lymphadenopathy:     Cervical: No cervical adenopathy.  Neurological:     Mental Status: He is alert. Mental status is at baseline.  Psychiatric:         Mood and Affect: Mood normal.        Behavior: Behavior normal.        Thought Content: Thought content normal.        Judgment: Judgment normal.     ASSESSMENT/PLAN:  Essential hypertension Assessment & Plan: Chronic.  Stable.  Reports blood pressure at home 140s/70s.  Currently asymptomatic.  Compliant with medications.  Has not taken medications yet today.  Recent creatinine within normal limits.  BP likely elevated secondary to chronic pain. Refill Hyzaar 100-25 mg daily Refill amlodipine 10 mg daily Monitor blood pressure at home.  Goal less than 130/80 Will follow-up in 2 weeks, if remains elevated plan to workup for secondary causes Discussed initiation of Celebrex for pain, side effects include GI bleed and cardiac events including hypertension/MI.  Patient agreeable to starting medication.     Orders: -     amLODIPine Besylate; Take 1 tablet (10 mg total) by mouth daily.  Dispense: 90 tablet; Refill: 3 -     Losartan Potassium-HCTZ; Take 1 tablet by mouth daily. At night  Dispense: 90 tablet; Refill: 3  Paroxysmal A-fib [facility]) Assessment & Plan: Referral for cardiology previously sent.  Not currently on Eliquis has been off for years.  Currently asymptomatic.   Hyperlipidemia,  unspecified hyperlipidemia type Assessment & Plan: Chronic.  Tolerating statin therapy, and no myalgias. Refill Lipitor 10 mg daily Referral previously sent to cardiology for CAD screening.  Patient interested in CT cardiac screening.  Orders: -     Atorvastatin Calcium; Take 1 tablet (10 mg total) by mouth daily. After 6 pm  Dispense: 90 tablet; Refill: 3  Primary osteoarthritis of right hip Assessment & Plan: Chronic.  Status post right hip arthroplasty.  Plan for left hip surgery in future. Discontinue diclofenac Start Celebrex 50 mg twice daily Start Tylenol 1300 mg 3 times daily Start Protonix 40 mg daily Continue plan for weight loss management, hip stretches and low impact  exercise. Follow-up with Dr. Lyla Glassing Follow-up with PCP in 2 weeks  Orders: -     Celecoxib; Take 1 capsule (50 mg total) by mouth 2 (two) times daily.  Dispense: 60 capsule; Refill: 0 -     Pantoprazole Sodium; Take 1 tablet (40 mg total) by mouth daily.  Dispense: 30 tablet; Refill: 0  Primary osteoarthritis of both knees Assessment & Plan: Chronic.  Valgus knees bilaterally.  Continues to have pain daily.  Was previously taking diclofenac 75 mg twice daily.  Discussed with patient switching to Celebrex and adding PPI for GI protection.  Patient agreeable. Start Celebrex 50 mg twice daily, can increase to 100 mg twice daily in 1 week if not improved Start Protonix 40 mg daily Start Tylenol 1300 mg 3 times daily, max dose 4 g.  LFTs within normal limits.  No alcohol intake Follow-up in 2 weeks Follow-up with Dr. Lyla Glassing    Primary osteoarthritis of both hips Assessment & Plan: Chronic.  Status post right hip arthroplasty.  Plan for left hip surgery in future. Discontinue diclofenac Start Celebrex 50 mg twice daily Start Tylenol 1300 mg 3 times daily Start Protonix 40 mg daily Continue plan for weight loss management, hip stretches and low impact exercise. Follow-up with Dr. Lyla Glassing Follow-up with PCP in 2 weeks   BMI 40.0-44.9, adult [facility]) Assessment & Plan: Chronic.  BMI greater than 40.  Has been actively trying to lose weight.  A1c in 2019 6.5.  Patient is diabetic not currently on medication.  Recent A1c 6.1 well-controlled. Could consider GLP-1 given hypertension, hyperlipidemia and diabetic history. Plan to discussed with patient at next visit.   HCM Colonoscopy scheduled 02/24 Abdominal ultrasound at age 13 for history of tobacco use Discussed tobacco cessation Declined flu vaccine COVID booster   PDMP reviewed  Return in about 2 weeks (around 01/06/2023) for PCP.  Carollee Leitz, MD

## 2022-12-23 NOTE — Assessment & Plan Note (Addendum)
Chronic.  Status post right hip arthroplasty.  Plan for left hip surgery in future. Discontinue diclofenac Start Celebrex 50 mg twice daily Start Tylenol 1300 mg 3 times daily Start Protonix 40 mg daily Continue plan for weight loss management, hip stretches and low impact exercise. Follow-up with Dr. Lyla Glassing Follow-up with PCP in 2 weeks

## 2022-12-23 NOTE — Assessment & Plan Note (Signed)
Chronic.  Valgus knees bilaterally.  Continues to have pain daily.  Was previously taking diclofenac 75 mg twice daily.  Discussed with patient switching to Celebrex and adding PPI for GI protection.  Patient agreeable. Start Celebrex 50 mg twice daily, can increase to 100 mg twice daily in 1 week if not improved Start Protonix 40 mg daily Start Tylenol 1300 mg 3 times daily, max dose 4 g.  LFTs within normal limits.  No alcohol intake Follow-up in 2 weeks Follow-up with Dr. Lyla Glassing

## 2022-12-23 NOTE — Patient Instructions (Addendum)
It was a pleasure meeting you today. Thank you for allowing me to take part in your health care.  Our goals for today as we discussed include:  Start Celebrex 50 mg twice daily Start Protonix 40 mg daily Start Tylenol 1300 mg 3 times a day Continue current activity at the gym.  Recommend hip stretching.  Monitor blood pressure at home.  Goal less than 130/80. Continue losartan-hydrochlorothiazide 100-25 mg daily Continue amlodipine 10 mg daily  Colonoscopy is booked for February 12.  Cardiology referral sent 11/12 for evaluation of atrial fibrillation and coronary artery screening.  If you have any questions or concerns, please do not hesitate to call the office at [phone].  I look forward to our next visit and until then take care and stay safe.  Regards,   Carollee Leitz, MD   [facility]

## 2022-12-23 NOTE — Assessment & Plan Note (Signed)
Referral for cardiology previously sent.  Not currently on Eliquis has been off for years.  Currently asymptomatic.

## 2022-12-23 NOTE — Assessment & Plan Note (Addendum)
Chronic.  Stable.  Reports blood pressure at home 140s/70s.  Currently asymptomatic.  Compliant with medications.  Has not taken medications yet today.  Recent creatinine within normal limits.  BP likely elevated secondary to chronic pain. Refill Hyzaar 100-25 mg daily Refill amlodipine 10 mg daily Monitor blood pressure at home.  Goal less than 130/80 Will follow-up in 2 weeks, if remains elevated plan to workup for secondary causes Discussed initiation of Celebrex for pain, side effects include GI bleed and cardiac events including hypertension/MI.  Patient agreeable to starting medication.

## 2022-12-23 NOTE — Assessment & Plan Note (Signed)
Chronic.  Tolerating statin therapy, and no myalgias. Refill Lipitor 10 mg daily Referral previously sent to cardiology for CAD screening.  Patient interested in CT cardiac screening.

## 2023-01-05 NOTE — Patient Instructions (Incomplete)
It was a pleasure meeting you today. Thank you for allowing me to take part in your health care.  Our goals for today as we discussed include:  Continue Celebrex 50 mg two times a day as needed Continue Protonix 40 mg daily Continue Tylenol 1300 mg three times a day Continue to remain active Follow up with Orthopedics   If you have any questions or concerns, please do not hesitate to call the office at [phone].  I look forward to our next visit and until then take care and stay safe.  Regards,   Carollee Leitz, MD   [facility]

## 2023-01-05 NOTE — Progress Notes (Deleted)
   SUBJECTIVE:  No chief complaint on file.  HPI Presents for follow up for ***. Seen in clinic on 01/09 and treated with ***.  Since then patient reports improvement in symptoms ***. Associated symptoms include ***.   PERTINENT PMH / PSH: ***  OBJECTIVE:  There were no vitals taken for this visit.   Physical Exam  ASSESSMENT/PLAN:  Primary osteoarthritis of right hip   PDMP reviewed***  No follow-ups on file.  Carollee Leitz, MD

## 2023-01-06 ENCOUNTER — Ambulatory Visit: Payer: BLUE CROSS/BLUE SHIELD | Admitting: Family Medicine

## 2023-01-06 DIAGNOSIS — I10 Essential (primary) hypertension: Secondary | ICD-10-CM

## 2023-01-06 DIAGNOSIS — E785 Hyperlipidemia, unspecified: Secondary | ICD-10-CM

## 2023-01-06 DIAGNOSIS — E559 Vitamin D deficiency, unspecified: Secondary | ICD-10-CM

## 2023-01-06 DIAGNOSIS — M1611 Unilateral primary osteoarthritis, right hip: Secondary | ICD-10-CM

## 2023-01-08 ENCOUNTER — Encounter: Payer: Self-pay | Admitting: Family Medicine

## 2023-01-13 ENCOUNTER — Other Ambulatory Visit: Payer: Self-pay

## 2023-01-13 MED ORDER — CELECOXIB 100 MG PO CAPS: 100.0000 mg | ORAL_CAPSULE | Freq: Two times a day (BID) | ORAL | 0 refills | Status: DC

## 2023-01-23 ENCOUNTER — Other Ambulatory Visit: Payer: Self-pay | Admitting: Family Medicine

## 2023-01-23 DIAGNOSIS — M1611 Unilateral primary osteoarthritis, right hip: Secondary | ICD-10-CM

## 2023-02-11 ENCOUNTER — Other Ambulatory Visit: Payer: Self-pay

## 2023-02-11 ENCOUNTER — Telehealth: Payer: Self-pay | Admitting: Family Medicine

## 2023-02-11 DIAGNOSIS — I10 Essential (primary) hypertension: Secondary | ICD-10-CM

## 2023-02-11 NOTE — Telephone Encounter (Signed)
Patient called and said that Dr Volanda Napoleon wanted him to do labs, no lab orders

## 2023-02-11 NOTE — Telephone Encounter (Signed)
I ordered the labs and called and  scheduled the patient.  Tashianna Broome,cma

## 2023-02-19 ENCOUNTER — Other Ambulatory Visit (INDEPENDENT_AMBULATORY_CARE_PROVIDER_SITE_OTHER): Payer: BC Managed Care – PPO

## 2023-02-19 DIAGNOSIS — I10 Essential (primary) hypertension: Secondary | ICD-10-CM

## 2023-02-19 LAB — COMPREHENSIVE METABOLIC PANEL
ALT: 27 U/L (ref 0–53)
AST: 28 U/L (ref 0–37)
Albumin: 4.3 g/dL (ref 3.5–5.2)
Alkaline Phosphatase: 50 U/L (ref 39–117)
BUN: 16 mg/dL (ref 6–23)
CO2: 29 mEq/L (ref 19–32)
Calcium: 9.6 mg/dL (ref 8.4–10.5)
Chloride: 104 mEq/L (ref 96–112)
Creatinine, Ser: 0.91 mg/dL (ref 0.40–1.50)
GFR: 101.04 mL/min (ref >60.00)
Glucose, Bld: 86 mg/dL (ref 70–99)
Potassium: 3.7 mEq/L (ref 3.5–5.1)
Sodium: 140 mEq/L (ref 135–145)
Total Bilirubin: 0.4 mg/dL (ref 0.2–1.2)
Total Protein: 7.5 g/dL (ref 6.0–8.3)

## 2023-02-26 ENCOUNTER — Other Ambulatory Visit: Payer: Self-pay | Admitting: Family Medicine

## 2023-02-26 DIAGNOSIS — M1611 Unilateral primary osteoarthritis, right hip: Secondary | ICD-10-CM

## 2023-03-18 ENCOUNTER — Other Ambulatory Visit: Payer: Self-pay | Admitting: Family Medicine

## 2023-03-18 ENCOUNTER — Encounter: Payer: Self-pay | Admitting: Family Medicine

## 2023-03-18 DIAGNOSIS — M1611 Unilateral primary osteoarthritis, right hip: Secondary | ICD-10-CM

## 2023-03-18 MED ORDER — PANTOPRAZOLE SODIUM 40 MG PO TBEC: 40.0000 mg | DELAYED_RELEASE_TABLET | Freq: Every day | ORAL | 0 refills | Status: DC

## 2023-03-18 MED ORDER — CELECOXIB 100 MG PO CAPS: 100.0000 mg | ORAL_CAPSULE | Freq: Two times a day (BID) | ORAL | 0 refills | Status: DC

## 2023-03-18 NOTE — Telephone Encounter (Signed)
Patient states he is following up on his request for a refill.  Prescription Request  03/18/2023  LOV: 12/23/2022  What is the name of the medication or equipment? Celecoxib 100mg   Have you contacted your pharmacy to request a refill? No   Which pharmacy would you like this sent to?  [facility], [address] in [place]    Patient notified that their request is being sent to the clinical staff for review and that they should receive a response within 2 business days.   Please advise at Mobile [phone] (mobile)  Patient states he is out of the medication and would like to have it refilled as soon as possible.  Patient had a transfer of care appointment on 12/23/2022 with Dr. Carollee Leitz.

## 2023-03-19 ENCOUNTER — Ambulatory Visit: Payer: BC Managed Care – PPO

## 2023-03-19 ENCOUNTER — Other Ambulatory Visit: Payer: Self-pay | Admitting: Family Medicine

## 2023-03-19 DIAGNOSIS — I10 Essential (primary) hypertension: Secondary | ICD-10-CM

## 2023-03-19 MED ORDER — LOSARTAN POTASSIUM-HCTZ 100-25 MG PO TABS: 1.0000 | ORAL_TABLET | Freq: Every day | ORAL | 3 refills | Status: DC

## 2023-03-19 NOTE — Progress Notes (Signed)
Pt presented in office for a BP check. I checked pt BP in his left arm. BP was 138/86 O2 was 97 HR was 78. Pt showed no signs of distress

## 2023-03-19 NOTE — Telephone Encounter (Signed)
Patient is scheduled today for a BP check so her can get his medication.  Charles Huang,c,a

## 2023-03-23 ENCOUNTER — Encounter: Payer: BLUE CROSS/BLUE SHIELD | Admitting: Family Medicine

## 2023-04-10 ENCOUNTER — Other Ambulatory Visit: Payer: Self-pay | Admitting: Family Medicine

## 2023-04-10 DIAGNOSIS — M1611 Unilateral primary osteoarthritis, right hip: Secondary | ICD-10-CM

## 2023-04-12 MED ORDER — CELECOXIB 100 MG PO CAPS: 100.0000 mg | ORAL_CAPSULE | Freq: Two times a day (BID) | ORAL | 1 refills | Status: DC

## 2023-04-12 MED ORDER — PANTOPRAZOLE SODIUM 40 MG PO TBEC: 40.0000 mg | DELAYED_RELEASE_TABLET | Freq: Every day | ORAL | 1 refills | Status: DC

## 2023-04-12 NOTE — Telephone Encounter (Signed)
Pt has not had a f/u as requested since TOC  Okay to refill?  Past Appointments  Date Time Status Provider Department Type Appt Notes  03/23/2023 11:00 AM Can Dana Allan, MD [facility] TOC TOC/kg  03/19/2023  2:45 PM Comp [place], [place], CMA [facility] NURSE BP check per walsh.  ng  02/19/2023  9:30 AM Comp [facility] LAB [facility] lab only.  nng  01/06/2023  8:00 AM No Show Dana Allan, MD [facility] OV 2-week follow-up/kg  12/23/2022  9:00 AM Comp Dana Allan, MD [facility] TOC TOC  needing to be seen sooner in a lot of pain/vfw

## 2023-05-21 ENCOUNTER — Encounter: Payer: Self-pay | Admitting: Family Medicine

## 2023-05-21 ENCOUNTER — Ambulatory Visit: Payer: BC Managed Care – PPO | Admitting: Family Medicine

## 2023-05-21 VITALS — BP 152/92 | HR 88 | Temp 97.8°F | Ht 75.0 in | Wt 338.0 lb

## 2023-05-21 DIAGNOSIS — E1159 Type 2 diabetes mellitus with other circulatory complications: Secondary | ICD-10-CM

## 2023-05-21 DIAGNOSIS — E559 Vitamin D deficiency, unspecified: Secondary | ICD-10-CM | POA: Diagnosis not present

## 2023-05-21 DIAGNOSIS — M160 Bilateral primary osteoarthritis of hip: Secondary | ICD-10-CM

## 2023-05-21 DIAGNOSIS — E118 Type 2 diabetes mellitus with unspecified complications: Secondary | ICD-10-CM

## 2023-05-21 DIAGNOSIS — E785 Hyperlipidemia, unspecified: Secondary | ICD-10-CM

## 2023-05-21 DIAGNOSIS — Z6841 Body Mass Index (BMI) 40.0 and over, adult: Secondary | ICD-10-CM

## 2023-05-21 DIAGNOSIS — M1611 Unilateral primary osteoarthritis, right hip: Secondary | ICD-10-CM | POA: Diagnosis not present

## 2023-05-21 DIAGNOSIS — I152 Hypertension secondary to endocrine disorders: Secondary | ICD-10-CM

## 2023-05-21 DIAGNOSIS — M170 Bilateral primary osteoarthritis of knee: Secondary | ICD-10-CM

## 2023-05-21 DIAGNOSIS — R7303 Prediabetes: Secondary | ICD-10-CM

## 2023-05-21 DIAGNOSIS — E1169 Type 2 diabetes mellitus with other specified complication: Secondary | ICD-10-CM

## 2023-05-21 DIAGNOSIS — I10 Essential (primary) hypertension: Secondary | ICD-10-CM | POA: Diagnosis not present

## 2023-05-21 MED ORDER — CHOLECALCIFEROL 1.25 MG (50000 UT) PO CAPS
50000.0000 [IU] | ORAL_CAPSULE | ORAL | 1 refills | Status: DC
Start: 2023-05-21 — End: 2024-03-24

## 2023-05-21 MED ORDER — AMLODIPINE BESYLATE 10 MG PO TABS: 10.0000 mg | ORAL_TABLET | Freq: Every day | ORAL | 3 refills | Status: DC

## 2023-05-21 MED ORDER — PANTOPRAZOLE SODIUM 40 MG PO TBEC
40.0000 mg | DELAYED_RELEASE_TABLET | Freq: Every day | ORAL | 1 refills | Status: DC
Start: 2023-05-21 — End: 2024-03-24

## 2023-05-21 MED ORDER — ATORVASTATIN CALCIUM 10 MG PO TABS: 10.0000 mg | ORAL_TABLET | Freq: Every day | ORAL | 3 refills | Status: DC

## 2023-05-21 MED ORDER — LOSARTAN POTASSIUM-HCTZ 100-25 MG PO TABS
1.0000 | ORAL_TABLET | Freq: Every day | ORAL | 3 refills | Status: DC
Start: 2023-05-21 — End: 2023-06-01

## 2023-05-21 NOTE — Patient Instructions (Signed)
It was a pleasure meeting you today. Thank you for allowing me to take part in your health care.  Our goals for today as we discussed include:  Restart Hyzarr today Monitor blood pressure.  If remains >140/90 restart Amlodipine 10 mg daily  Stop Celebrex  Take Tylenol 650 mg 2 tablets three times a day Take Ibuprofen sparingly  Schedule appointment for blood work.  Fast for 10 hours  Will  send referral to Pain Management  Follow up with Bariatric Surgery in [place], Dr Adolphus Birchwood. If needing a referral please MyChart me.   Follow up in 3 months for blood pressure check  If you have any questions or concerns, please do not hesitate to call the office at [phone].  I look forward to our next visit and until then take care and stay safe.  Regards,   Dana Allan, MD   [facility]

## 2023-05-21 NOTE — Progress Notes (Signed)
SUBJECTIVE:   Chief Complaint  Patient presents with   Weight Gain    Pt stated--gaining weight   Pain    Pt stated--both knee and left hip pain.   HPI Patient presents to clinic for management chronic care  Hypertension Elevated.  Recently having headaches.  Prescribed Hyzaar and Norvasc for which he is self discontinued.  Blood pressures at home in the 150s-160s.  Reports did not want to be taking medication and that could manage without.  Osteoarthritis of bilateral hips/knees Was previously taking Celebrex 100 mg twice daily.  Given increase in blood pressure recommend discontinuing medication.  Has not seen pain management in past.  Recommend consult to pain management for continued management.  Was previously seen by orthopedics and recommended weight loss for surgical intervention.  Morbid obesity Increasing weight despite efforts to eat healthy.  Difficulty with exercise given arthritic pain.  He is now interested in bariatric surgery.  Significant other recommended Dr. Adolphus Birchwood, at [facility] in [place].  Diabetes type 2 Asymptomatic.  A1c elevated at 6.5 in 2019.  Since then has been well-controlled without medication. Prescribed ARB, self d/c'd.  On Statin.   PERTINENT PMH / PSH: Osteoarthritis bilateral hips and knees Morbid obesity Hypertension Hyperlipidemia DM type II  OBJECTIVE:  BP (!) 152/92   Pulse 88   Temp 97.8 F (36.6 C)   Ht 6\' 3"  (1.905 m)   Wt (!) 338 lb (153.3 kg)   SpO2 96%   BMI 42.25 kg/m    Physical Exam Vitals reviewed.  Constitutional:      General: He is not in acute distress.    Appearance: Normal appearance. He is normal weight. He is not ill-appearing, toxic-appearing or diaphoretic.  Eyes:     General:        Right eye: No discharge.        Left eye: No discharge.  Cardiovascular:     Rate and Rhythm: Normal rate and regular rhythm.     Heart sounds: Normal heart sounds.  Pulmonary:     Effort: Pulmonary effort is  normal.     Breath sounds: Normal breath sounds.  Abdominal:     General: Bowel sounds are normal.  Musculoskeletal:        General: Normal range of motion.     Cervical back: Normal range of motion.  Skin:    General: Skin is warm and dry.  Neurological:     Mental Status: He is alert and oriented to person, place, and time. Mental status is at baseline.  Psychiatric:        Mood and Affect: Mood normal.        Behavior: Behavior normal.        Thought Content: Thought content normal.        Judgment: Judgment normal.     ASSESSMENT/PLAN:  Essential hypertension Assessment & Plan: Chronic.  Elevated.  Reports not taking prescribed medications. Restart Hyzaar 100-25 mg daily Restart amlodipine 10 mg daily Recheck c-Met Monitor blood pressure at home.  Goal less than 130/80 Will follow-up in 2 weeks Discontinue Celebrex     Orders: -     amLODIPine Besylate; Take 1 tablet (10 mg total) by mouth daily.  Dispense: 90 tablet; Refill: 3 -     Losartan Potassium-HCTZ; Take 1 tablet by mouth daily.  Dispense: 90 tablet; Refill: 3 -     TSH; Future -     Comprehensive metabolic panel; Future  Hyperlipidemia, unspecified hyperlipidemia type  Assessment & Plan: Chronic.  Tolerating statin therapy, and no myalgias. Refill Lipitor 10 mg daily Referral previously sent to cardiology for CAD screening.  Had not followed up with scheduling appointment. Check lipids at next visit  Orders: -     Atorvastatin Calcium; Take 1 tablet (10 mg total) by mouth daily. After 6 pm  Dispense: 90 tablet; Refill: 3 -     Lipid panel; Future  Vitamin D deficiency Assessment & Plan: Refill vitamin D 1.25 mg weekly Check vitamin D level at next visit  Orders: -     Cholecalciferol; Take 1 capsule (50,000 Units total) by mouth once a week.  Dispense: 13 capsule; Refill: 1 -     VITAMIN D 25 Hydroxy (Vit-D Deficiency, Fractures); Future  Primary osteoarthritis of right hip -     Pantoprazole  Sodium; Take 1 tablet (40 mg total) by mouth daily.  Dispense: 30 tablet; Refill: 1 -     CBC with Differential/Platelet; Future -     Ambulatory referral to Pain Clinic  Prediabetes -     Hemoglobin A1c; Future  Primary osteoarthritis of both knees Assessment & Plan: Chronic.  Valgus knees bilaterally.  Continues to have pain daily.  Previously followed by orthopedics who recommended surgery if BMI less than 40 Blood pressure elevated due to noncompliance with BP meds Discontinue Celebrex Will refer to pain management Follow-up with orthopedics as scheduled   Orders: -     Ambulatory referral to Pain Clinic  Morbid obesity with BMI of 40.0-44.9, adult [facility]) Assessment & Plan: Chronic.  BMI elevated. Check TSH Check A1c Patient to follow-up with [facility] for appointment with bariatric surgery, Dr. Adolphus Birchwood at [place].     Orders: -     Ambulatory referral to Pain Clinic  Primary osteoarthritis of both hips -     Ambulatory referral to Pain Clinic  Hyperlipidemia associated with type 2 diabetes mellitus (HCC) Assessment & Plan: Chronic.  Tolerating statin therapy, and no myalgias. Refill Lipitor 10 mg daily Referral previously sent to cardiology for CAD screening.  Had not followed up with scheduling appointment. Check lipids at next visit   Hypertension associated with diabetes [facility]) Assessment & Plan: Chronic.  Elevated.  Reports not taking prescribed medications. Restart Hyzaar 100-25 mg daily Restart amlodipine 10 mg daily Recheck c-Met Monitor blood pressure at home.  Goal less than 130/80 Will follow-up in 2 weeks Discontinue Celebrex      Type 2 diabetes with complication [facility]) Assessment & Plan: Chronic.  Well-controlled without medications. Blood pressure remains elevated as patient self discontinued Restart Hyzaar 100-25 mg daily Restart amlodipine 10 mg daily On statin therapy Foot exam at next visit Recommend eye exam annually Urine  ACR at next visit Would benefit for GLP-1 injectable, plan to discuss at next visit   Orders: -     Microalbumin / creatinine urine ratio; Future  HCM Declined Colonoscopy and Cologuard  PDMP reviewed  Return in about 3 months (around 08/21/2023) for PCP.  Dana Allan, MD

## 2023-05-27 ENCOUNTER — Other Ambulatory Visit: Payer: BC Managed Care – PPO

## 2023-05-30 ENCOUNTER — Encounter: Payer: Self-pay | Admitting: Family Medicine

## 2023-05-30 DIAGNOSIS — E118 Type 2 diabetes mellitus with unspecified complications: Secondary | ICD-10-CM | POA: Insufficient documentation

## 2023-05-30 NOTE — Assessment & Plan Note (Addendum)
Chronic.  Well-controlled without medications. Blood pressure remains elevated as patient self discontinued Restart Hyzaar 100-25 mg daily Restart amlodipine 10 mg daily On statin therapy Foot exam at next visit Recommend eye exam annually Urine ACR at next visit Would benefit for GLP-1 injectable, plan to discuss at next visit

## 2023-05-30 NOTE — Assessment & Plan Note (Signed)
Chronic.  BMI elevated. Check TSH Check A1c Patient to follow-up with [facility] for appointment with bariatric surgery, Dr. Adolphus Birchwood at [place].

## 2023-05-30 NOTE — Assessment & Plan Note (Signed)
Chronic.  Valgus knees bilaterally.  Continues to have pain daily.  Previously followed by orthopedics who recommended surgery if BMI less than 40 Blood pressure elevated due to noncompliance with BP meds Discontinue Celebrex Will refer to pain management Follow-up with orthopedics as scheduled

## 2023-05-30 NOTE — Assessment & Plan Note (Signed)
Refill vitamin D 1.25 mg weekly Check vitamin D level at next visit

## 2023-05-30 NOTE — Assessment & Plan Note (Signed)
Chronic.  Tolerating statin therapy, and no myalgias. Refill Lipitor 10 mg daily Referral previously sent to cardiology for CAD screening.  Had not followed up with scheduling appointment. Check lipids at next visit

## 2023-05-30 NOTE — Assessment & Plan Note (Signed)
Chronic.  Elevated.  Reports not taking prescribed medications. Restart Hyzaar 100-25 mg daily Restart amlodipine 10 mg daily Recheck c-Met Monitor blood pressure at home.  Goal less than 130/80 Will follow-up in 2 weeks Discontinue Celebrex

## 2023-06-01 ENCOUNTER — Encounter: Payer: Self-pay | Admitting: Family Medicine

## 2023-06-01 ENCOUNTER — Other Ambulatory Visit: Payer: Self-pay | Admitting: Family Medicine

## 2023-06-01 DIAGNOSIS — I10 Essential (primary) hypertension: Secondary | ICD-10-CM

## 2023-06-01 MED ORDER — LOSARTAN POTASSIUM-HCTZ 100-25 MG PO TABS: 1.0000 | ORAL_TABLET | Freq: Every day | ORAL | 3 refills | Status: DC

## 2023-06-02 ENCOUNTER — Other Ambulatory Visit (INDEPENDENT_AMBULATORY_CARE_PROVIDER_SITE_OTHER): Payer: BC Managed Care – PPO

## 2023-06-02 ENCOUNTER — Other Ambulatory Visit: Payer: Self-pay | Admitting: Family Medicine

## 2023-06-02 DIAGNOSIS — E785 Hyperlipidemia, unspecified: Secondary | ICD-10-CM | POA: Diagnosis not present

## 2023-06-02 DIAGNOSIS — R350 Frequency of micturition: Secondary | ICD-10-CM

## 2023-06-02 DIAGNOSIS — E559 Vitamin D deficiency, unspecified: Secondary | ICD-10-CM

## 2023-06-02 DIAGNOSIS — I10 Essential (primary) hypertension: Secondary | ICD-10-CM

## 2023-06-02 DIAGNOSIS — E118 Type 2 diabetes mellitus with unspecified complications: Secondary | ICD-10-CM | POA: Diagnosis not present

## 2023-06-02 DIAGNOSIS — M1611 Unilateral primary osteoarthritis, right hip: Secondary | ICD-10-CM | POA: Diagnosis not present

## 2023-06-02 DIAGNOSIS — R7303 Prediabetes: Secondary | ICD-10-CM

## 2023-06-02 LAB — COMPREHENSIVE METABOLIC PANEL
ALT: 31 U/L (ref 0–53)
AST: 31 U/L (ref 0–37)
Albumin: 4.7 g/dL (ref 3.5–5.2)
Alkaline Phosphatase: 46 U/L (ref 39–117)
BUN: 18 mg/dL (ref 6–23)
CO2: 31 mEq/L (ref 19–32)
Calcium: 9.6 mg/dL (ref 8.4–10.5)
Chloride: 100 mEq/L (ref 96–112)
Creatinine, Ser: 0.92 mg/dL (ref 0.40–1.50)
GFR: 99.52 mL/min (ref >60.00)
Glucose, Bld: 90 mg/dL (ref 70–99)
Potassium: 3.8 mEq/L (ref 3.5–5.1)
Sodium: 139 mEq/L (ref 135–145)
Total Bilirubin: 0.7 mg/dL (ref 0.2–1.2)
Total Protein: 8.2 g/dL (ref 6.0–8.3)

## 2023-06-02 LAB — CBC WITH DIFFERENTIAL/PLATELET
Basophils Absolute: 0.1 10*3/uL (ref 0.0–0.1)
Basophils Relative: 0.8 % (ref 0.0–3.0)
Eosinophils Absolute: 0.1 10*3/uL (ref 0.0–0.7)
Eosinophils Relative: 1.1 % (ref 0.0–5.0)
HCT: 43.8 % (ref 39.0–52.0)
Hemoglobin: 14.3 g/dL (ref 13.0–17.0)
Lymphocytes Relative: 28.5 % (ref 12.0–46.0)
Lymphs Abs: 2.4 10*3/uL (ref 0.7–4.0)
MCHC: 32.6 g/dL (ref 30.0–36.0)
MCV: 84.2 fl (ref 78.0–100.0)
Monocytes Absolute: 0.7 10*3/uL (ref 0.1–1.0)
Monocytes Relative: 8.4 % (ref 3.0–12.0)
Neutro Abs: 5.1 10*3/uL (ref 1.4–7.7)
Neutrophils Relative %: 61.2 % (ref 43.0–77.0)
Platelets: 268 10*3/uL (ref 150.0–400.0)
RBC: 5.2 Mil/uL (ref 4.22–5.81)
RDW: 15.6 % — ABNORMAL HIGH (ref 11.5–15.5)
WBC: 8.4 10*3/uL (ref 4.0–10.5)

## 2023-06-02 LAB — MICROALBUMIN / CREATININE URINE RATIO
Creatinine,U: 254 mg/dL
Microalb Creat Ratio: 1.8 mg/g (ref 0.0–30.0)
Microalb, Ur: 4.5 mg/dL — ABNORMAL HIGH (ref 0.0–1.9)

## 2023-06-02 LAB — LIPID PANEL
Cholesterol: 158 mg/dL (ref 0–200)
HDL: 45.7 mg/dL (ref >39.00)
LDL Cholesterol: 95 mg/dL (ref 0–99)
NonHDL: 111.92
Total CHOL/HDL Ratio: 3
Triglycerides: 86 mg/dL (ref 0.0–149.0)
VLDL: 17.2 mg/dL (ref 0.0–40.0)

## 2023-06-02 LAB — VITAMIN D 25 HYDROXY (VIT D DEFICIENCY, FRACTURES): VITD: 17.39 ng/mL — ABNORMAL LOW (ref 30.00–100.00)

## 2023-06-02 LAB — HEMOGLOBIN A1C: Hgb A1c MFr Bld: 5.7 % (ref 4.6–6.5)

## 2023-06-02 LAB — TSH: TSH: 1.23 u[IU]/mL (ref 0.35–5.50)

## 2023-06-02 NOTE — Telephone Encounter (Signed)
Patient is scheduled for 06/08/23 at 9:40 for urinary concerns.

## 2023-06-08 ENCOUNTER — Ambulatory Visit: Payer: BC Managed Care – PPO | Admitting: Family Medicine

## 2023-06-24 ENCOUNTER — Ambulatory Visit: Payer: BC Managed Care – PPO | Admitting: Family Medicine

## 2023-07-06 ENCOUNTER — Ambulatory Visit: Admission: RE | Admit: 2023-07-06 | Payer: BC Managed Care – PPO | Source: Home / Self Care

## 2023-07-06 ENCOUNTER — Encounter: Admission: RE | Payer: Self-pay | Source: Home / Self Care

## 2023-07-06 SURGERY — COLONOSCOPY WITH PROPOFOL
Anesthesia: General

## 2023-07-09 ENCOUNTER — Encounter: Payer: Self-pay | Admitting: Family Medicine

## 2023-07-09 ENCOUNTER — Ambulatory Visit: Payer: BC Managed Care – PPO | Admitting: Family Medicine

## 2023-07-09 ENCOUNTER — Other Ambulatory Visit (HOSPITAL_COMMUNITY): Admission: RE | Admit: 2023-07-09 | Payer: BC Managed Care – PPO | Source: Ambulatory Visit

## 2023-07-09 VITALS — BP 131/83 | HR 79 | Temp 98.0°F | Wt 343.8 lb

## 2023-07-09 DIAGNOSIS — Z113 Encounter for screening for infections with a predominantly sexual mode of transmission: Secondary | ICD-10-CM | POA: Diagnosis not present

## 2023-07-09 DIAGNOSIS — R3989 Other symptoms and signs involving the genitourinary system: Secondary | ICD-10-CM | POA: Insufficient documentation

## 2023-07-09 DIAGNOSIS — R319 Hematuria, unspecified: Secondary | ICD-10-CM | POA: Insufficient documentation

## 2023-07-09 DIAGNOSIS — R32 Unspecified urinary incontinence: Secondary | ICD-10-CM | POA: Diagnosis not present

## 2023-07-09 LAB — POCT URINALYSIS DIP (CLINITEK)
Bilirubin, UA: NEGATIVE
Glucose, UA: NEGATIVE mg/dL
Ketones, POC UA: NEGATIVE mg/dL
Leukocytes, UA: NEGATIVE
Nitrite, UA: NEGATIVE
POC PROTEIN,UA: NEGATIVE
Spec Grav, UA: 1.025 (ref 1.010–1.025)
Urobilinogen, UA: 0.2 E.U./dL
pH, UA: 5.5 (ref 5.0–8.0)

## 2023-07-09 LAB — BASIC METABOLIC PANEL
BUN: 21 mg/dL (ref 6–23)
CO2: 29 mEq/L (ref 19–32)
Calcium: 9.6 mg/dL (ref 8.4–10.5)
Chloride: 102 mEq/L (ref 96–112)
Creatinine, Ser: 0.99 mg/dL (ref 0.40–1.50)
GFR: 91.08 mL/min (ref >60.00)
Glucose, Bld: 92 mg/dL (ref 70–99)
Potassium: 3.7 mEq/L (ref 3.5–5.1)
Sodium: 139 mEq/L (ref 135–145)

## 2023-07-09 LAB — PSA: PSA: 0.31 ng/mL (ref 0.10–4.00)

## 2023-07-09 NOTE — Patient Instructions (Addendum)
It was a pleasure meeting you today. Thank you for allowing me to take part in your health care.  Our goals for today as we discussed include:  Checking urine and blood work today If needing treatment will notify you. Urine negative for infection.  Did show a trace of blood. Will wait for result of blood work and other urine results  Recommend decreasing fluid few hours before leaving work to see if this will make a difference. Recommend not holding urine for longer than 4 hours.   Recommend Tylenol 650 mg 2 tablets 2-3 times a day.  Max 4000 mg daily Recommend avoiding Ibuprofen.    If you have any questions or concerns, please do not hesitate to call the office at [phone].  I look forward to our next visit and until then take care and stay safe.  Regards,   Dana Allan, MD   [facility]

## 2023-07-09 NOTE — Progress Notes (Signed)
SUBJECTIVE:   Chief Complaint  Patient presents with   Urinary concerns    Trouble holding urine. Pt drinking water unable to leave urine right now.   HPI Presents for acute visit  Concerned for not able to hold urine for long period of time.  Reports during the day he is able to hold urine for up to 6 hours without any issue.  At night when working he cannot make it past six hours and typically tries to hold off on going to the bathroom until he is almost done with work as the last bathroom is closer.  He does have difficulty with walking ling distances due to low back pain so does not like to use the closer bathroom as it add more walking.  Denies any fevers, frequency, dysuria, hematuria, penile discharge, testicular pain.  Drinks at least 40 oz fluid or more regularly.  PERTINENT PMH / PSH: Obesity Class 3 HTN HLD  OBJECTIVE:  BP 131/83 (BP Location: Left Arm, Patient Position: Sitting, Cuff Size: Large)   Pulse 79   Temp 98 F (36.7 C) (Temporal)   Wt (!) 343 lb 12.8 oz (155.9 kg)   SpO2 99%   BMI 42.97 kg/m    Physical Exam Vitals reviewed.  Constitutional:      General: He is not in acute distress.    Appearance: Normal appearance. He is obese. He is not ill-appearing, toxic-appearing or diaphoretic.  Eyes:     General:        Right eye: No discharge.        Left eye: No discharge.     Conjunctiva/sclera: Conjunctivae normal.  Cardiovascular:     Rate and Rhythm: Normal rate.  Pulmonary:     Effort: Pulmonary effort is normal.  Abdominal:     General: There is no distension.     Tenderness: There is no abdominal tenderness. There is no right CVA tenderness or left CVA tenderness.  Musculoskeletal:     Cervical back: Normal range of motion.  Skin:    General: Skin is warm and dry.  Neurological:     Mental Status: He is alert and oriented to person, place, and time. Mental status is at baseline.  Psychiatric:        Mood and Affect: Mood normal.         Behavior: Behavior normal.        Thought Content: Thought content normal.        Judgment: Judgment normal.        07/09/2023   11:14 AM 05/21/2023    9:43 AM 12/23/2022    9:19 AM 10/23/2022    7:11 AM 09/17/2022    8:11 AM  Depression screen PHQ 2/9  Decreased Interest 0 0 0 0 0  Down, Depressed, Hopeless 0 0 0 0 0  PHQ - 2 Score 0 0 0 0 0  Altered sleeping 1 3 0    Tired, decreased energy 1 0 0    Change in appetite 0 1 0    Feeling bad or failure about yourself  0 1 0    Trouble concentrating 0 0 0    Moving slowly or fidgety/restless 0 0 0    Suicidal thoughts 0 0 0    PHQ-9 Score 2 5 0    Difficult doing work/chores Somewhat difficult Not difficult at all Not difficult at all        07/09/2023   11:14 AM 05/21/2023    9:44  AM 12/23/2022    9:20 AM  GAD 7 : Generalized Anxiety Score  Nervous, Anxious, on Edge 0 0 0  Control/stop worrying 0 0 0  Worry too much - different things 0 0 0  Trouble relaxing 0 0 0  Restless 0 0 0  Easily annoyed or irritable 1 0 0  Afraid - awful might happen 0 0 0  Total GAD 7 Score 1 0 0  Anxiety Difficulty Not difficult at all Not difficult at all Not difficult at all    ASSESSMENT/PLAN:  Urine troubles Assessment & Plan: Suspect increased fluid and intentionally holding urine contributing to symptoms Recommend that he decrease fluid intake few hours before finishing shift and empty bladder at lest 4 hours prior to completion of shift if possible Recent A1c normal Check PSA, urinalysis, urine GC&C, Bmet today Follow up if no improvement in symptoms. Can refer to Urology at that time  Orders: -     Urine cytology ancillary only -     PSA -     Basic metabolic panel -     POCT URINALYSIS DIP (CLINITEK)   PDMP reviewed  Return if symptoms worsen or fail to improve.  Dana Allan, MD

## 2023-07-10 LAB — URINE CYTOLOGY ANCILLARY ONLY: Comment: NEGATIVE

## 2023-07-25 ENCOUNTER — Encounter: Payer: Self-pay | Admitting: Family Medicine

## 2023-07-25 DIAGNOSIS — R3989 Other symptoms and signs involving the genitourinary system: Secondary | ICD-10-CM | POA: Insufficient documentation

## 2023-07-25 NOTE — Assessment & Plan Note (Signed)
Suspect increased fluid and intentionally holding urine contributing to symptoms Recommend that he decrease fluid intake few hours before finishing shift and empty bladder at lest 4 hours prior to completion of shift if possible Recent A1c normal Check PSA, urinalysis, urine GC&C, Bmet today Follow up if no improvement in symptoms. Can refer to Urology at that time

## 2023-07-30 ENCOUNTER — Encounter (INDEPENDENT_AMBULATORY_CARE_PROVIDER_SITE_OTHER): Payer: Self-pay

## 2023-08-13 ENCOUNTER — Ambulatory Visit: Payer: BC Managed Care – PPO | Admitting: Family Medicine

## 2023-08-13 ENCOUNTER — Encounter: Payer: Self-pay | Admitting: Family Medicine

## 2023-08-13 VITALS — BP 136/88 | HR 105 | Temp 98.2°F | Resp 16 | Ht 74.5 in | Wt 343.0 lb

## 2023-08-13 DIAGNOSIS — M545 Low back pain, unspecified: Secondary | ICD-10-CM

## 2023-08-13 MED ORDER — CYCLOBENZAPRINE HCL 10 MG PO TABS
10.0000 mg | ORAL_TABLET | Freq: Three times a day (TID) | ORAL | 0 refills | Status: DC | PRN
Start: 2023-08-13 — End: 2024-03-24

## 2023-08-13 MED ORDER — PREDNISONE 20 MG PO TABS
40.0000 mg | ORAL_TABLET | Freq: Every day | ORAL | 0 refills | Status: AC
Start: 2023-08-13 — End: 2023-08-18

## 2023-08-13 NOTE — Progress Notes (Signed)
SUBJECTIVE:   Chief Complaint  Patient presents with   Back Pain   HPI Presents for acute visit  Low back pain Worked out at gym on Monday, treadmill and incline.  Next day had mild back pain, went to work and when trying to get work boots on felt back worsen.  On standing and attempting to start work was unable to go to work area.  Had to take time off. Took 40 mins to get work boots off.  Back almost gave out.  Has been in bed for three days and using walker when needing to ambulate.  Today pain has slightly improved and is able to use cane.  Denies any fevers, lower extremity numbness, tingling, weakness.  No loss of bowel/bladder function and no saddle anesthesia.  No urinary symptoms.  Has long history of back pain.  Last MRI in 2020 showed mild arthritis, mild narrowing of lower back structures and possibly nerve compression.  He reports not having seen specialists for this  Ibuprofen, heat had slightly helped.  Requesting FMLA and note for work today  PERTINENT PMH / PSH: Chronic back pain OA bilateral knees HTN Obesity Class 3  OBJECTIVE:  BP 136/88   Pulse (!) 105   Temp 98.2 F (36.8 C)   Resp 16   Ht 6' 2.5" (1.892 m)   Wt (!) 343 lb (155.6 kg)   SpO2 96%   BMI 43.45 kg/m    Physical Exam Musculoskeletal:     Cervical back: Normal.     Thoracic back: No swelling, edema, spasms, tenderness or bony tenderness. Normal range of motion.     Lumbar back: Spasms and tenderness present. No swelling, edema or bony tenderness. Decreased range of motion. Positive right straight leg raise test and positive left straight leg raise test.        08/13/2023   10:16 AM 07/09/2023   11:14 AM 05/21/2023    9:43 AM 12/23/2022    9:19 AM 10/23/2022    7:11 AM  Depression screen PHQ 2/9  Decreased Interest 1 0 0 0 0  Down, Depressed, Hopeless 0 0 0 0 0  PHQ - 2 Score 1 0 0 0 0  Altered sleeping 0 1 3 0   Tired, decreased energy 0 1 0 0   Change in appetite 0 0 1 0   Feeling  bad or failure about yourself  1 0 1 0   Trouble concentrating 0 0 0 0   Moving slowly or fidgety/restless 0 0 0 0   Suicidal thoughts 0 0 0 0   PHQ-9 Score 2 2 5  0   Difficult doing work/chores  Somewhat difficult Not difficult at all Not difficult at all       08/13/2023   10:17 AM 07/09/2023   11:14 AM 05/21/2023    9:44 AM 12/23/2022    9:20 AM  GAD 7 : Generalized Anxiety Score  Nervous, Anxious, on Edge 0 0 0 0  Control/stop worrying 0 0 0 0  Worry too much - different things 0 0 0 0  Trouble relaxing 0 0 0 0  Restless 0 0 0 0  Easily annoyed or irritable 1 1 0 0  Afraid - awful might happen 0 0 0 0  Total GAD 7 Score 1 1 0 0  Anxiety Difficulty Not difficult at all Not difficult at all Not difficult at all Not difficult at all    ASSESSMENT/PLAN:  Acute bilateral low back pain, unspecified  whether sciatica present Assessment & Plan: Acute on Chronic low back pain.  No red flags. Slightly improved today Will try short course steroid and muscle relaxant Requested Tramadol.  Will hold off on Opioids for now.  Recommend Orthopedic evaluation Patient will call [facility] for appointment, if needing referral would be happy to send Note provided for work FMLA forms to be completed by 09/12.  Once completed will notify patient for pick up. Follow up as needed   Orders: -     Cyclobenzaprine HCl; Take 1 tablet (10 mg total) by mouth 3 (three) times daily as needed for muscle spasms.  Dispense: 30 tablet; Refill: 0 -     predniSONE; Take 2 tablets (40 mg total) by mouth daily with breakfast for 5 days.  Dispense: 10 tablet; Refill: 0   PDMP reviewed  Return if symptoms worsen or fail to improve, for PCP.  Dana Allan, MD

## 2023-08-13 NOTE — Patient Instructions (Addendum)
It was a pleasure meeting you today. Thank you for allowing me to take part in your health care.  Our goals for today as we discussed include:  Call to schedule appointment Orthopedics [facility] in [place] [address] [place], [place] [address] SAME DAY APPOINTMENT To Schedule a Physician Appointment: [phone]  This is a list of the screening recommended for you and due dates:  Health Maintenance  Topic Date Due   Colon Cancer Screening  Never done   COVID-19 Vaccine (3 - 2023-24 season) 08/15/2022   Flu Shot  07/16/2023   Hemoglobin A1C  12/02/2023   Yearly kidney health urinalysis for diabetes  06/01/2024   Yearly kidney function blood test for diabetes  07/08/2024   DTaP/Tdap/Td vaccine (2 - Td or Tdap) 08/12/2028   Hepatitis C Screening  Completed   HIV Screening  Completed   HPV Vaccine  Aged Out   Complete foot exam   Discontinued   Eye exam for diabetics  Discontinued     If you have any questions or concerns, please do not hesitate to call the office at [phone].  I look forward to our next visit and until then take care and stay safe.  Regards,   Dana Allan, MD   [facility]

## 2023-08-13 NOTE — Assessment & Plan Note (Addendum)
Acute on Chronic low back pain.  No red flags. Slightly improved today Will try short course steroid and muscle relaxant Requested Tramadol.  Will hold off on Opioids for now.  Recommend Orthopedic evaluation Patient will call [facility] for appointment, if needing referral would be happy to send Note provided for work FMLA forms to be completed by 09/12.  Once completed will notify patient for pick up. Follow up as needed

## 2023-08-23 ENCOUNTER — Encounter: Payer: Self-pay | Admitting: Family Medicine

## 2023-08-25 ENCOUNTER — Emergency Department: Payer: BC Managed Care – PPO

## 2023-08-25 ENCOUNTER — Other Ambulatory Visit: Payer: Self-pay

## 2023-08-25 ENCOUNTER — Encounter: Payer: Self-pay | Admitting: Emergency Medicine

## 2023-08-25 ENCOUNTER — Ambulatory Visit: Payer: BC Managed Care – PPO | Admitting: Family Medicine

## 2023-08-25 ENCOUNTER — Emergency Department
Admission: EM | Admit: 2023-08-25 | Discharge: 2023-08-25 | Disposition: A | Discharge status: Home / Self Care | Payer: BC Managed Care – PPO | Attending: Emergency Medicine | Admitting: Emergency Medicine

## 2023-08-25 DIAGNOSIS — M545 Low back pain, unspecified: Secondary | ICD-10-CM | POA: Diagnosis not present

## 2023-08-25 DIAGNOSIS — R109 Unspecified abdominal pain: Secondary | ICD-10-CM | POA: Diagnosis present

## 2023-08-25 LAB — CBC WITH DIFFERENTIAL/PLATELET
Abs Immature Granulocytes: 0.03 10*3/uL (ref 0.00–0.07)
Basophils Absolute: 0.1 10*3/uL (ref 0.0–0.1)
Basophils Relative: 1 %
Eosinophils Absolute: 0.2 10*3/uL (ref 0.0–0.5)
Eosinophils Relative: 2 %
HCT: 45.4 % (ref 39.0–52.0)
Hemoglobin: 14.8 g/dL (ref 13.0–17.0)
Immature Granulocytes: 0 %
Lymphocytes Relative: 32 %
Lymphs Abs: 2.9 10*3/uL (ref 0.7–4.0)
MCH: 27.6 pg (ref 26.0–34.0)
MCHC: 32.6 g/dL (ref 30.0–36.0)
MCV: 84.7 fL (ref 80.0–100.0)
Monocytes Absolute: 0.8 10*3/uL (ref 0.1–1.0)
Monocytes Relative: 9 %
Neutro Abs: 5 10*3/uL (ref 1.7–7.7)
Neutrophils Relative %: 56 %
Platelets: 264 10*3/uL (ref 150–400)
RBC: 5.36 MIL/uL (ref 4.22–5.81)
RDW: 16.1 % — ABNORMAL HIGH (ref 11.5–15.5)
WBC: 8.8 10*3/uL (ref 4.0–10.5)
nRBC: 0 % (ref 0.0–0.2)

## 2023-08-25 LAB — COMPREHENSIVE METABOLIC PANEL
ALT: 35 U/L (ref 0–44)
AST: 35 U/L (ref 15–41)
Albumin: 4.9 g/dL (ref 3.5–5.0)
Alkaline Phosphatase: 55 U/L (ref 38–126)
Anion gap: 10 (ref 5–15)
BUN: 17 mg/dL (ref 6–20)
CO2: 27 mmol/L (ref 22–32)
Calcium: 9.6 mg/dL (ref 8.9–10.3)
Chloride: 101 mmol/L (ref 98–111)
Creatinine, Ser: 0.91 mg/dL (ref 0.61–1.24)
GFR, Estimated: >60 mL/min (ref >60)
Glucose, Bld: 100 mg/dL — ABNORMAL HIGH (ref 70–99)
Potassium: 3.6 mmol/L (ref 3.5–5.1)
Sodium: 138 mmol/L (ref 135–145)
Total Bilirubin: 0.6 mg/dL (ref 0.3–1.2)
Total Protein: 7.9 g/dL (ref 6.5–8.1)

## 2023-08-25 LAB — URINALYSIS, ROUTINE W REFLEX MICROSCOPIC
Bacteria, UA: NONE SEEN
Bilirubin Urine: NEGATIVE
Glucose, UA: NEGATIVE mg/dL
Ketones, ur: NEGATIVE mg/dL
Leukocytes,Ua: NEGATIVE
Nitrite: NEGATIVE
Protein, ur: NEGATIVE mg/dL
RBC / HPF: 0 RBC/hpf (ref 0–5)
Specific Gravity, Urine: 1.018 (ref 1.005–1.030)
Squamous Epithelial / HPF: 0 /HPF (ref 0–5)
pH: 5 (ref 5.0–8.0)

## 2023-08-25 MED ORDER — LIDOCAINE 5 % EX PTCH: 1.0000 | MEDICATED_PATCH | Freq: Two times a day (BID) | CUTANEOUS | 0 refills | Status: AC

## 2023-08-25 MED ORDER — METHOCARBAMOL 500 MG PO TABS: 500.0000 mg | ORAL_TABLET | Freq: Three times a day (TID) | ORAL | 0 refills | Status: DC | PRN

## 2023-08-25 MED ORDER — ACETAMINOPHEN 500 MG PO TABS
1000.0000 mg | ORAL_TABLET | Freq: Once | ORAL | Status: AC
  Administered 2023-08-25: 1000 mg via ORAL
  Filled 2023-08-25: qty 2

## 2023-08-25 MED ORDER — KETOROLAC TROMETHAMINE 30 MG/ML IJ SOLN
30.0000 mg | Freq: Once | INTRAMUSCULAR | Status: AC
  Administered 2023-08-25: 30 mg via INTRAMUSCULAR
  Filled 2023-08-25: qty 1

## 2023-08-25 MED ORDER — METHOCARBAMOL 500 MG PO TABS
500.0000 mg | ORAL_TABLET | Freq: Once | ORAL | Status: AC
  Administered 2023-08-25: 500 mg via ORAL
  Filled 2023-08-25: qty 1

## 2023-08-25 MED ORDER — LIDOCAINE 5 % EX PTCH
1.0000 | MEDICATED_PATCH | CUTANEOUS | Status: DC
  Administered 2023-08-25: 1 via TRANSDERMAL
  Filled 2023-08-25: qty 1

## 2023-08-25 NOTE — ED Notes (Signed)
Pt asleep when RN walked in and then drifting off in between conversation.

## 2023-08-25 NOTE — ED Provider Notes (Signed)
[facility] Provider Note    Event Date/Time   First MD Initiated Contact with Patient 08/25/23 [phone]     (approximate)   History   Flank Pain   HPI  Charles Huang is a 47 y.o. male who presents to the ED for evaluation of Flank Pain   Review of PCP visit from 2 weeks ago.  Acute visit for lower back pain after working out at the gym.  Patient presents for evaluation of acute on subacute right sided lower back pain.  Reports pain for 2 or 3 weeks.  Reports a history of arthritis.  He has had a right hip replacement and working on the left, but needs to lose weight first.  Reports he was in the gym and has had pain since then.  No discrete falls or injuries.  No fevers, saddle anesthesias or urinary/stool changes.  He reports concern that it may be his kidney  Physical Exam   Triage Vital Signs: ED Triage Vitals  Encounter Vitals Group     BP 08/25/23 0040 (!) 165/98     Systolic BP Percentile --      Diastolic BP Percentile --      Pulse Rate 08/25/23 0040 95     Resp 08/25/23 0040 18     Temp 08/25/23 0040 98.9 F (37.2 C)     Temp Source 08/25/23 0040 Oral     SpO2 08/25/23 0040 100 %     Weight 08/25/23 0038 (!) 337 lb (152.9 kg)     Height 08/25/23 0038 6\' 3"  (1.905 m)     Head Circumference --      Peak Flow --      Pain Score 08/25/23 0038 6     Pain Loc --      Pain Education --      Exclude from Growth Chart --     Most recent vital signs: Vitals:   08/25/23 0415 08/25/23 0447  BP:  136/87  Pulse: 82 92  Resp:  18  Temp:    SpO2: 98% 96%    General: Awake, no distress.  CV:  Good peripheral perfusion.  Resp:  Normal effort.  Abd:  No distention.  MSK:  No deformity noted.  Right-sided paraspinal lumbar tenderness.  No CVA tenderness.  No midline tenderness. Neuro:  No focal deficits appreciated. Other:     ED Results / Procedures / Treatments   Labs (all labs ordered are listed, but only abnormal results are  displayed) Labs Reviewed  CBC WITH DIFFERENTIAL/PLATELET - Abnormal; Notable for the following components:      Result Value   RDW 16.1 (*)    All other components within normal limits  COMPREHENSIVE METABOLIC PANEL - Abnormal; Notable for the following components:   Glucose, Bld 100 (*)    All other components within normal limits  URINALYSIS, ROUTINE W REFLEX MICROSCOPIC - Abnormal; Notable for the following components:   Color, Urine YELLOW (*)    APPearance CLEAR (*)    Hgb urine dipstick SMALL (*)    All other components within normal limits    EKG   RADIOLOGY CT renal study interpreted by me without evidence of acute pathology  Official radiology report(s): CT Renal Stone Study  Result Date: 08/25/2023 CLINICAL DATA:  Right-sided flank pain. EXAM: CT ABDOMEN AND PELVIS WITHOUT CONTRAST TECHNIQUE: Multidetector CT imaging of the abdomen and pelvis was performed following the standard protocol without IV contrast. RADIATION DOSE REDUCTION: This exam  was performed according to the departmental dose-optimization program which includes automated exposure control, adjustment of the mA and/or kV according to patient size and/or use of iterative reconstruction technique. COMPARISON:  None Available. FINDINGS: Lower chest: No acute abnormality. Hepatobiliary: No focal liver abnormality is seen. No gallstones, gallbladder wall thickening, or biliary dilatation. Pancreas: Unremarkable. No pancreatic ductal dilatation or surrounding inflammatory changes. Spleen: Normal in size without focal abnormality. Adrenals/Urinary Tract: Adrenal glands are unremarkable. Kidneys are normal in size, without renal calculi or hydronephrosis. A 5.4 cm diameter cyst is seen within the anterior aspect of the mid right kidney. An additional 1.4 cm area of parenchymal low attenuation is seen within the mid left kidney. The urinary bladder is poorly distended and subsequently limited in evaluation. Stomach/Bowel:  Stomach is within normal limits. Appendix appears normal. No evidence of bowel wall thickening, distention, or inflammatory changes. Vascular/Lymphatic: No significant vascular findings are present. No enlarged abdominal or pelvic lymph nodes. Reproductive: Prostate is unremarkable. Other: No abdominal wall hernia or abnormality. No abdominopelvic ascites. Musculoskeletal: There is a total right hip replacement with associated streak artifact and subsequently limited evaluation of the adjacent osseous and soft tissue structures. No acute osseous abnormalities are identified. IMPRESSION: 1. No evidence of nephrolithiasis or obstructive uropathy. 2. Bilateral renal cysts, as described above. Further evaluation with nonemergent renal ultrasound is recommended. This recommendation follows ACR consensus guidelines: Management of the Incidental Renal Mass on CT: A White Paper of the ACR Incidental Findings Committee. J Am Coll Radiol 2018;15:264-273. 3. Total right hip replacement. Electronically Signed   By: Aram Candela M.D.   On: 08/25/2023 02:10    PROCEDURES and INTERVENTIONS:  Procedures  Medications  lidocaine (LIDODERM) 5 % 1 patch (1 patch Transdermal Patch Applied 08/25/23 0446)  acetaminophen (TYLENOL) tablet 1,000 mg (1,000 mg Oral Given 08/25/23 0446)  ketorolac (TORADOL) 30 MG/ML injection 30 mg (30 mg Intramuscular Given 08/25/23 0446)  methocarbamol (ROBAXIN) tablet 500 mg (500 mg Oral Given 08/25/23 0446)     IMPRESSION / MDM / ASSESSMENT AND PLAN / ED COURSE  I reviewed the triage vital signs and the nursing notes.  Differential diagnosis includes, but is not limited to, lumbar strain, cauda equina, ureteral stone  {Patient presents with symptoms of an acute illness or injury that is potentially life-threatening.  Patient presents with right sided lumbar pain that I suspect is muscular in etiology and suitable for outpatient management.  No red flag features or indications for  lumbar imaging.  CT renal study obtained from triage without acute features.  Normal urine, metabolic panel and CBC.  We will provide nonnarcotic multimodal analgesia and reassess.  Anticipate he will be suitable for outpatient management.  Clinical Course as of 08/25/23 0554  Tue Aug 25, 2023  0981 Kerby Less, feels better and is appreciative.  Discussed management at home [DS]    Clinical Course User Index [DS] Delton Prairie, MD     FINAL CLINICAL IMPRESSION(S) / ED DIAGNOSES   Final diagnoses:  Right lumbar pain     Rx / DC Orders   ED Discharge Orders          Ordered    lidocaine (LIDODERM) 5 %  Every 12 hours        08/25/23 0418    methocarbamol (ROBAXIN) 500 MG tablet  Every 8 hours PRN        08/25/23 0418             Note:  This document was prepared  using Conservation officer, historic buildings and may include unintentional dictation errors.   Delton Prairie, MD 08/25/23 [phone]

## 2023-08-25 NOTE — ED Triage Notes (Signed)
Pt presents ambulatory to triage via POV with complaints of R sided flank pain x 2 weeks. Pt also endorses frequent urination over the last week. He notes taking Tylenol and Motrin without any improvement in sx. A&Ox4 at this time. Denies CP or SOB.

## 2023-08-25 NOTE — Discharge Instructions (Addendum)
Please take Tylenol and ibuprofen/Advil for your pain.  It is safe to take them together, or to alternate them every few hours.  Take up to 1000mg  of Tylenol at a time, up to 4 times per day.  Do not take more than 4000 mg of Tylenol in 24 hours.  For ibuprofen, take 400-600 mg, 3 - 4 times per day.  Please use lidocaine patches at your site of pain.  Apply 1 patch at a time, leave on for 12 hours, then remove for 12 hours.  12 hours on, 12 hours off.  Do not apply more than 1 patch at a time.  Use Robaxin muscle relaxer as needed for more severe/breakthrough pain, up to 3 times per day. This medication can make some people sleepy, so do not use while driving, working or Designer, television/film set

## 2023-08-27 ENCOUNTER — Telehealth: Payer: Self-pay

## 2023-08-27 NOTE — Transitions of Care (Post Inpatient/ED Visit) (Signed)
I spoke with pt; pt was seen [facility] ED on 08/25/23 with lower rt sided back pain that was mostly dull and continuous. no fall or injury; no urinary symptoms other than frequency.. pt said back pain had been gong on for couple of weeks. Pt is trying to eat ore fiber with fresh fruit and vegetables. Pt said that ED could not tell pt what was causing the low rt sided back pain and the lidocaine 5% patch and methocarbamol does not really help the pain. Offered to schedule appt for pt with [facility] but pt said at this time he does not want to schedule appt and if appt needed pt will call to schedule. Sending note to Dr Dana Allan.     08/27/2023  Name: Charles Huang MRN: [id number] DOB: 1976/04/24  Today's TOC FU Call Status: Today's TOC FU Call Status:: Successful TOC FU Call Completed TOC FU Call Complete Date: 08/27/23 Patient's Name and Date of Birth confirmed.  Transition Care Management Follow-up Telephone Call Date of Discharge: 08/25/23 Discharge Facility: [facility]) Type of Discharge: Emergency Department Reason for ED Visit: Other: (pt was seen [facility] ED on 08/25/23 with lower rt sided back pain that was mostly dull and continuous. no fall or injury; no urinary symptoms other than frequency.. pt said  back pasin had been gong on for couple of weeks.) How have you been since you were released from the hospital?: Same Any questions or concerns?: No  Items Reviewed: Did you receive and understand the discharge instructions provided?: Yes Medications obtained,verified, and reconciled?: Yes (Medications Reviewed) (pt said lidocaine patch 5% and metocarbamol not really helping back pain.) Any new allergies since your discharge?: No Dietary orders reviewed?: NA Do you have support at home?: Yes People in Home: friend(s) Name of Support/Comfort Primary Source: Verlon Au  Medications Reviewed Today: Medications Reviewed Today     Reviewed by Patience Musca, LPN (Licensed  Practical Nurse) on 08/27/23 at 1607  Med List Status: <None>   Medication Order Taking? Sig Documenting Provider Last Dose Status Informant  amLODipine (NORVASC) 10 MG tablet [id number] No Take 1 tablet (10 mg total) by mouth daily.  Patient not taking: Reported on 08/13/2023   Dana Allan, MD Not Taking Active   atorvastatin (LIPITOR) 10 MG tablet [id number] No Take 1 tablet (10 mg total) by mouth daily. After 6 pm  Patient not taking: Reported on 08/13/2023   Dana Allan, MD Not Taking Active   Cholecalciferol 1.25 MG (50000 UT) capsule [id number] No Take 1 capsule (50,000 Units total) by mouth once a week.  Patient not taking: Reported on 08/13/2023   Dana Allan, MD Not Taking Active   cyclobenzaprine (FLEXERIL) 10 MG tablet [id number]  Take 1 tablet (10 mg total) by mouth 3 (three) times daily as needed for muscle spasms. Dana Allan, MD  Active   lidocaine (LIDODERM) 5 % [id number]  Place 1 patch onto the skin every 12 (twelve) hours. Remove & Discard patch within 12 hours or as directed by MD Delton Prairie, MD  Active   losartan-hydrochlorothiazide (HYZAAR) 100-25 MG tablet [id number] No Take 1 tablet by mouth daily. Dana Allan, MD Taking Active   methocarbamol (ROBAXIN) 500 MG tablet [id number]  Take 1 tablet (500 mg total) by mouth every 8 (eight) hours as needed. Delton Prairie, MD  Active   pantoprazole (PROTONIX) 40 MG tablet [id number] No Take 1 tablet (40 mg total) by mouth daily.  Patient not taking: Reported on 07/09/2023  Dana Allan, MD Not Taking Expired 07/20/23 2359   Med List Note Concepcion Elk, [place] 06/14/19 1606): 06-14-19 [place] requests submitted for Gabapentin and Diclofenac per Covermymeds. dw            Home Care and Equipment/Supplies: Were Home Health Services Ordered?: NA Any new equipment or medical supplies ordered?: NA  Functional Questionnaire: Do you need assistance with bathing/showering or dressing?: No Do you need assistance with meal preparation?:  No Do you need assistance with eating?: No Do you have difficulty maintaining continence: No Do you need assistance with getting out of bed/getting out of a chair/moving?: No Do you have difficulty managing or taking your medications?: No  Follow up appointments reviewed: PCP Follow-up appointment confirmed?: NA (pt does not feel that he needs FU appt at this time and pt will call for appt at [facility] if needed.) Specialist Hospital Follow-up appointment confirmed?: NA Do you need transportation to your follow-up appointment?: No Do you understand care options if your condition(s) worsen?: Yes-patient verbalized understanding    SIGNATURE Lewanda Rife, LPN

## 2023-08-31 ENCOUNTER — Encounter: Payer: Self-pay | Admitting: Family Medicine

## 2023-09-04 ENCOUNTER — Telehealth: Payer: Self-pay

## 2023-09-04 NOTE — Telephone Encounter (Signed)
Called pt and informed that form is completed and ready to pick up from the front office.

## 2023-10-20 ENCOUNTER — Encounter: Payer: Self-pay | Admitting: Family Medicine

## 2023-10-27 NOTE — Telephone Encounter (Signed)
Form has been copied, placed in folder up front, and pt notified.

## 2023-12-03 ENCOUNTER — Telehealth: Payer: Self-pay | Admitting: Family Medicine

## 2023-12-03 NOTE — Telephone Encounter (Signed)
Patient dropped off FMLA paper work. Fax number is [phone],attn Feliciana Rossetti. Paper work due 12/17/2023. New Paper work is up front in Dr Joaquin Bend color folder

## 2023-12-04 NOTE — Telephone Encounter (Signed)
Placed in provider box. Will call pt when form is ready for pick up.

## 2023-12-17 ENCOUNTER — Telehealth: Payer: Self-pay

## 2023-12-17 NOTE — Telephone Encounter (Signed)
 Called pt  and let know that FMLA paperwork has completed, faxed, and is up front in folder waiting ready for pick-up.

## 2023-12-17 NOTE — Telephone Encounter (Signed)
 Called pt and informed him that forms are completed and ready for pick-up.

## 2024-03-03 ENCOUNTER — Encounter: Payer: BC Managed Care – PPO | Admitting: Family Medicine

## 2024-03-24 ENCOUNTER — Encounter: Payer: Self-pay | Admitting: Family Medicine

## 2024-03-24 ENCOUNTER — Ambulatory Visit: Admitting: Family Medicine

## 2024-03-24 VITALS — BP 132/88 | HR 80 | Temp 97.5°F | Resp 20 | Ht 75.0 in | Wt 336.0 lb

## 2024-03-24 DIAGNOSIS — I152 Hypertension secondary to endocrine disorders: Secondary | ICD-10-CM

## 2024-03-24 DIAGNOSIS — E1169 Type 2 diabetes mellitus with other specified complication: Secondary | ICD-10-CM | POA: Diagnosis not present

## 2024-03-24 DIAGNOSIS — E785 Hyperlipidemia, unspecified: Secondary | ICD-10-CM

## 2024-03-24 DIAGNOSIS — M1611 Unilateral primary osteoarthritis, right hip: Secondary | ICD-10-CM

## 2024-03-24 DIAGNOSIS — M545 Low back pain, unspecified: Secondary | ICD-10-CM

## 2024-03-24 DIAGNOSIS — M160 Bilateral primary osteoarthritis of hip: Secondary | ICD-10-CM

## 2024-03-24 DIAGNOSIS — Z6841 Body Mass Index (BMI) 40.0 and over, adult: Secondary | ICD-10-CM

## 2024-03-24 DIAGNOSIS — E1159 Type 2 diabetes mellitus with other circulatory complications: Secondary | ICD-10-CM | POA: Diagnosis not present

## 2024-03-24 DIAGNOSIS — E559 Vitamin D deficiency, unspecified: Secondary | ICD-10-CM

## 2024-03-24 DIAGNOSIS — E118 Type 2 diabetes mellitus with unspecified complications: Secondary | ICD-10-CM | POA: Diagnosis not present

## 2024-03-24 DIAGNOSIS — Z1211 Encounter for screening for malignant neoplasm of colon: Secondary | ICD-10-CM

## 2024-03-24 MED ORDER — NABUMETONE 500 MG PO TABS
500.0000 mg | ORAL_TABLET | Freq: Two times a day (BID) | ORAL | 0 refills | Status: DC | PRN
Start: 2024-03-24 — End: 2024-04-15

## 2024-03-24 MED ORDER — PANTOPRAZOLE SODIUM 40 MG PO TBEC
40.0000 mg | DELAYED_RELEASE_TABLET | Freq: Every day | ORAL | 3 refills | Status: DC
Start: 2024-03-24 — End: 2024-04-15

## 2024-03-24 MED ORDER — CYCLOBENZAPRINE HCL 10 MG PO TABS
10.0000 mg | ORAL_TABLET | Freq: Three times a day (TID) | ORAL | 1 refills | Status: DC | PRN
Start: 2024-03-24 — End: 2024-04-15

## 2024-03-24 MED ORDER — METHOCARBAMOL 500 MG PO TABS
500.0000 mg | ORAL_TABLET | Freq: Three times a day (TID) | ORAL | 1 refills | Status: DC | PRN
Start: 2024-03-24 — End: 2024-04-15

## 2024-03-24 NOTE — Progress Notes (Signed)
 SUBJECTIVE:   Chief Complaint  Patient presents with   Back Pain    Lower back  started hurting worse last night and getting worse ever sec   HPI Presents for acute visit  Discussed the use of AI scribe software for clinical note transcription with the patient, who gave verbal consent to proceed.  History of Present Illness Charles Huang is a 48 year old male with spinal stenosis who presents with acute sharp back pain.  He experiences a sharp back pain that is different from his usual back pain. Typically, his back pain resolves after a few days with rest and taking Flexeril  (cyclobenzaprine ), but this time the pain is more severe and has not subsided. The pain is located at the base of his spine and started on March 21, 2024, two days after an intense workout at the gym on March 19, 2024. He did not experience any immediate pain after the workout and did not engage in any unusual activities or sustain any injuries.  He has a history of spinal stenosis diagnosed in 2017, but has not had recent x-rays or seen an orthopedic specialist in a while. His back pain usually occurs after working long hours, but this episode is more intense and has affected his ability to walk, requiring assistance to get to his vehicle at work.  He reports recent changes in bowel habits, including constipation followed by severe diarrhea, which he describes as 'like turning on a faucet'. No abdominal pain, fever, or changes in urination. His diet primarily consists of salmon, shrimp, and cucumbers, with occasional indulgence in fast food.  He has a history of hypertension and has been off his blood pressure medications for two weeks, relying on beet juice, exercise, and diet to manage his blood pressure. He monitors his blood pressure at home, which has been in the high 120s over low 80s range. He reports a weight fluctuation from 321 to 338 pounds, with a goal to reduce his weight for a hip replacement surgery.  He  is currently out of his usual medications, including Flexeril  and another muscle relaxant, and has not found relief from his current regimen. He typically needs to take two Flexeril  tablets to experience any effect, which he acknowledges may not be advisable.    PERTINENT PMH / PSH: As above  OBJECTIVE:  BP 132/88   Pulse 80   Temp (!) 97.5 F (36.4 C)   Resp 20   Ht 6\' 3"  (1.905 m)   Wt (!) 336 lb (152.4 kg)   SpO2 98%   BMI 42.00 kg/m    Physical Exam Vitals reviewed.  Constitutional:      General: He is not in acute distress.    Appearance: Normal appearance. He is obese. He is not ill-appearing, toxic-appearing or diaphoretic.  Eyes:     General:        Right eye: No discharge.        Left eye: No discharge.  Cardiovascular:     Rate and Rhythm: Normal rate and regular rhythm.     Heart sounds: Normal heart sounds.  Pulmonary:     Effort: Pulmonary effort is normal.     Breath sounds: Normal breath sounds.  Abdominal:     General: Bowel sounds are normal.  Musculoskeletal:        General: No deformity or signs of injury. Normal range of motion.     Cervical back: Normal and normal range of motion.     Thoracic back:  Normal.     Lumbar back: Spasms and tenderness present. No bony tenderness. Normal range of motion. Negative right straight leg raise test and negative left straight leg raise test.  Skin:    General: Skin is warm and dry.  Neurological:     Mental Status: He is alert and oriented to person, place, and time. Mental status is at baseline.  Psychiatric:        Mood and Affect: Mood normal.        Behavior: Behavior normal.        Thought Content: Thought content normal.        Judgment: Judgment normal.           03/24/2024    2:56 PM 08/13/2023   10:16 AM 07/09/2023   11:14 AM 05/21/2023    9:43 AM 12/23/2022    9:19 AM  Depression screen PHQ 2/9  Decreased Interest 0 1 0 0 0  Down, Depressed, Hopeless 0 0 0 0 0  PHQ - 2 Score 0 1 0 0 0   Altered sleeping 1 0 1 3 0  Tired, decreased energy 1 0 1 0 0  Change in appetite 0 0 0 1 0  Feeling bad or failure about yourself  0 1 0 1 0  Trouble concentrating 0 0 0 0 0  Moving slowly or fidgety/restless 0 0 0 0 0  Suicidal thoughts 0 0 0 0 0  PHQ-9 Score 2 2 2 5  0  Difficult doing work/chores Not difficult at all  Somewhat difficult Not difficult at all Not difficult at all      03/24/2024    2:56 PM 08/13/2023   10:17 AM 07/09/2023   11:14 AM 05/21/2023    9:44 AM  GAD 7 : Generalized Anxiety Score  Nervous, Anxious, on Edge 0 0 0 0  Control/stop worrying 0 0 0 0  Worry too much - different things 0 0 0 0  Trouble relaxing 0 0 0 0  Restless 0 0 0 0  Easily annoyed or irritable 0 1 1 0  Afraid - awful might happen 0 0 0 0  Total GAD 7 Score 0 1 1 0  Anxiety Difficulty Not difficult at all Not difficult at all Not difficult at all Not difficult at all    ASSESSMENT/PLAN:  Acute bilateral low back pain, unspecified whether sciatica present Assessment & Plan: Sharp, severe pain at the spine base post-exercise. Suspected flare-up of spinal stenosis or new musculoskeletal injury.  - Order lumbar spine x-ray. - Prescribe Ralafen 500 mg BID PRN for pain.  - Refill Flexeril  for muscle relaxation. - Advise massage for symptom relief. - Discuss NSAID side effects, including GI issues and blood pressure effects. - Continue Protonix  for GI protection on NSAID   Orders: -     Cyclobenzaprine  HCl; Take 1 tablet (10 mg total) by mouth 3 (three) times daily as needed for muscle spasms.  Dispense: 90 tablet; Refill: 1 -     Methocarbamol ; Take 1 tablet (500 mg total) by mouth every 8 (eight) hours as needed.  Dispense: 30 tablet; Refill: 1 -     DG Lumbar Spine 2-3 Views  Hypertension associated with diabetes (HCC) Assessment & Plan: Self discontinued medications.  Blood pressure 132/88 mmHg, relatively controlled. Emphasized regular monitoring to prevent complications. - Monitor  blood pressure at home. - Order labs for kidney function and other parameters. - Discuss importance of blood pressure control and risks of untreated hypertension.  Orders: -     Comprehensive metabolic panel with GFR; Future  Hyperlipidemia associated with type 2 diabetes mellitus (HCC) -     Lipid panel; Future  Type 2 diabetes with complication (HCC) -     Hemoglobin A1c; Future -     Microalbumin / creatinine urine ratio; Future  Vitamin D  deficiency -     VITAMIN D  25 Hydroxy (Vit-D Deficiency, Fractures); Future  Primary osteoarthritis of both hips -     Nabumetone ; Take 1 tablet (500 mg total) by mouth 2 (two) times daily as needed.  Dispense: 60 tablet; Refill: 0  Primary osteoarthritis of right hip -     Pantoprazole  Sodium; Take 1 tablet (40 mg total) by mouth daily.  Dispense: 30 tablet; Refill: 3 -     Cyclobenzaprine  HCl; Take 1 tablet (10 mg total) by mouth 3 (three) times daily as needed for muscle spasms.  Dispense: 90 tablet; Refill: 1 -     Methocarbamol ; Take 1 tablet (500 mg total) by mouth every 8 (eight) hours as needed.  Dispense: 30 tablet; Refill: 1  Colon cancer screening -     Cologuard  Morbid obesity with BMI of 40.0-44.9, adult (HCC) Assessment & Plan: Weight loss needed for hip replacement eligibility. Emphasized weight management for health and surgical eligibility. - Continue current diet and exercise. - Use home scales for weight tracking. - Discuss importance of weight management for surgical eligibility and health.     PDMP reviewed  Return for PCP.  Valli Gaw, MD

## 2024-03-24 NOTE — Patient Instructions (Addendum)
 It was a pleasure meeting you today. Thank you for allowing me to take part in your health care.  Our goals for today as we discussed include:  Schedule fasting lab appointment.  Fast for 10 hours  Cologuard ordered.  Complete as directed  Start Relafen 1 tablet two times a day as needed for pain  Refills sent for requested medications  Xray of lower back today  Follow up as scheduled   This is a list of the screening recommended for you and due dates:  Health Maintenance  Topic Date Due   Colon Cancer Screening  Never done   COVID-19 Vaccine (3 - 2024-25 season) 08/16/2023   Hemoglobin A1C  12/02/2023   Yearly kidney health urinalysis for diabetes  06/01/2024   Flu Shot  07/15/2024   Yearly kidney function blood test for diabetes  08/24/2024   Pneumococcal Vaccination (3 of 3 - PCV20 or PCV21) 11/29/2026   DTaP/Tdap/Td vaccine (2 - Td or Tdap) 08/12/2028   Hepatitis C Screening  Completed   HIV Screening  Completed   HPV Vaccine  Aged Out   Meningitis B Vaccine  Aged Out   Complete foot exam   Discontinued   Eye exam for diabetics  Discontinued      If you have any questions or concerns, please do not hesitate to call the office at [phone].  I look forward to our next visit and until then take care and stay safe.  Regards,   Dana Allan, MD   [facility]

## 2024-04-03 ENCOUNTER — Encounter: Payer: Self-pay | Admitting: Family Medicine

## 2024-04-03 DIAGNOSIS — Z1211 Encounter for screening for malignant neoplasm of colon: Secondary | ICD-10-CM | POA: Insufficient documentation

## 2024-04-03 NOTE — Assessment & Plan Note (Addendum)
 Sharp, severe pain at the spine base post-exercise. Suspected flare-up of spinal stenosis or new musculoskeletal injury.  - Order lumbar spine x-ray. - Prescribe Ralafen 500 mg BID PRN for pain.  - Refill Flexeril  for muscle relaxation. - Advise massage for symptom relief. - Discuss NSAID side effects, including GI issues and blood pressure effects. - Continue Protonix  for GI protection on NSAID

## 2024-04-03 NOTE — Assessment & Plan Note (Signed)
 Weight loss needed for hip replacement eligibility. Emphasized weight management for health and surgical eligibility. - Continue current diet and exercise. - Use home scales for weight tracking. - Discuss importance of weight management for surgical eligibility and health.

## 2024-04-03 NOTE — Assessment & Plan Note (Signed)
 Self discontinued medications.  Blood pressure 132/88 mmHg, relatively controlled. Emphasized regular monitoring to prevent complications. - Monitor blood pressure at home. - Order labs for kidney function and other parameters. - Discuss importance of blood pressure control and risks of untreated hypertension.

## 2024-04-07 ENCOUNTER — Encounter: Admitting: Family Medicine

## 2024-04-14 ENCOUNTER — Encounter: Payer: Self-pay | Admitting: Family Medicine

## 2024-04-14 ENCOUNTER — Ambulatory Visit: Admitting: Family Medicine

## 2024-04-14 VITALS — BP 120/66 | HR 93 | Temp 98.0°F | Resp 20 | Ht 74.0 in | Wt 332.0 lb

## 2024-04-14 DIAGNOSIS — Z0000 Encounter for general adult medical examination without abnormal findings: Secondary | ICD-10-CM

## 2024-04-14 DIAGNOSIS — M160 Bilateral primary osteoarthritis of hip: Secondary | ICD-10-CM

## 2024-04-14 DIAGNOSIS — M170 Bilateral primary osteoarthritis of knee: Secondary | ICD-10-CM

## 2024-04-14 DIAGNOSIS — E559 Vitamin D deficiency, unspecified: Secondary | ICD-10-CM

## 2024-04-14 DIAGNOSIS — E785 Hyperlipidemia, unspecified: Secondary | ICD-10-CM

## 2024-04-14 DIAGNOSIS — E1169 Type 2 diabetes mellitus with other specified complication: Secondary | ICD-10-CM

## 2024-04-14 DIAGNOSIS — I152 Hypertension secondary to endocrine disorders: Secondary | ICD-10-CM

## 2024-04-14 DIAGNOSIS — E1159 Type 2 diabetes mellitus with other circulatory complications: Secondary | ICD-10-CM | POA: Diagnosis not present

## 2024-04-14 DIAGNOSIS — E118 Type 2 diabetes mellitus with unspecified complications: Secondary | ICD-10-CM | POA: Diagnosis not present

## 2024-04-14 DIAGNOSIS — E538 Deficiency of other specified B group vitamins: Secondary | ICD-10-CM | POA: Insufficient documentation

## 2024-04-14 LAB — MICROALBUMIN / CREATININE URINE RATIO
Creatinine,U: 149.2 mg/dL
Microalb Creat Ratio: 8.7 mg/g (ref 0.0–30.0)
Microalb, Ur: 1.3 mg/dL (ref 0.0–1.9)

## 2024-04-14 LAB — COMPREHENSIVE METABOLIC PANEL WITH GFR
ALT: 38 U/L (ref 0–53)
AST: 34 U/L (ref 0–37)
Albumin: 4.8 g/dL (ref 3.5–5.2)
Alkaline Phosphatase: 51 U/L (ref 39–117)
BUN: 23 mg/dL (ref 6–23)
CO2: 29 meq/L (ref 19–32)
Calcium: 9.6 mg/dL (ref 8.4–10.5)
Chloride: 101 meq/L (ref 96–112)
Creatinine, Ser: 0.87 mg/dL (ref 0.40–1.50)
GFR: 102.61 mL/min (ref >60.00)
Glucose, Bld: 89 mg/dL (ref 70–99)
Potassium: 3.8 meq/L (ref 3.5–5.1)
Sodium: 138 meq/L (ref 135–145)
Total Bilirubin: 0.6 mg/dL (ref 0.2–1.2)
Total Protein: 7.6 g/dL (ref 6.0–8.3)

## 2024-04-14 LAB — HEMOGLOBIN A1C: Hgb A1c MFr Bld: 5.8 % (ref 4.6–6.5)

## 2024-04-14 LAB — LIPID PANEL
Cholesterol: 168 mg/dL (ref 0–200)
HDL: 45.4 mg/dL (ref >39.00)
LDL Cholesterol: 98 mg/dL (ref 0–99)
NonHDL: 122.93
Total CHOL/HDL Ratio: 4
Triglycerides: 125 mg/dL (ref 0.0–149.0)
VLDL: 25 mg/dL (ref 0.0–40.0)

## 2024-04-14 LAB — VITAMIN D 25 HYDROXY (VIT D DEFICIENCY, FRACTURES): VITD: 20.35 ng/mL — ABNORMAL LOW (ref 30.00–100.00)

## 2024-04-14 NOTE — Progress Notes (Signed)
 SUBJECTIVE:   Chief Complaint  Patient presents with   Annual Exam   HPI Presents for annual visit  Discussed the use of AI scribe software for clinical note transcription with the patient, who gave verbal consent to proceed.  History of Present Illness Charles Huang is a 48 year old male who presents for an annual physical exam.  He has a history of hypertension and diabetes. He recently restarted his blood pressure medications, including amlodipine  10 mg at night, atorvastatin  10 mg, and losartan  100 mg, approximately two weeks ago. His blood pressure is well-controlled since resuming these medications.  He is managing his cholesterol with atorvastatin  and is awaiting lab results to determine if any adjustments are needed. He previously stopped his cholesterol medication but is currently taking it as prescribed.  He experiences chronic pain and uses Robaxin  and Flexeril  as needed for muscle spasms, particularly after physical activity such as going to the gym. He takes Robaxin  once a day and Flexeril  only when in significant pain. There has been no decrease in the need for Robaxin  since starting an NSAID, which he takes twice a day.  He has a history of orthopedic issues, including 'bone on bone' conditions in his hip and knees. He has not seen his orthopedic specialist, Tedra Fears, in a year and a half due to needing to lose weight before further surgical interventions can be considered.  He has a Cologuard test kit that he received but has not yet completed. It has been in his truck for a week and a half, and he plans to complete it and send it back soon.  He is up to date on his vaccines, with the next tetanus shot being the only one potentially due soon.    PERTINENT PMH / PSH: As above  OBJECTIVE:  BP 120/66   Pulse 93   Temp 98 F (36.7 C)   Resp 20   Ht 6\' 2"  (1.88 m)   Wt (!) 332 lb (150.6 kg)   SpO2 97%   BMI 42.63 kg/m    Physical Exam Vitals reviewed.   Constitutional:      Appearance: He is obese.  HENT:     Head: Normocephalic.     Right Ear: Tympanic membrane, ear canal and external ear normal.     Left Ear: Tympanic membrane, ear canal and external ear normal.     Nose: Nose normal.     Mouth/Throat:     Mouth: Mucous membranes are moist.  Eyes:     Conjunctiva/sclera: Conjunctivae normal.     Pupils: Pupils are equal, round, and reactive to light.  Neck:     Vascular: No carotid bruit.  Cardiovascular:     Rate and Rhythm: Normal rate and regular rhythm.     Pulses: Normal pulses.     Heart sounds: Normal heart sounds.  Pulmonary:     Effort: Pulmonary effort is normal.     Breath sounds: Normal breath sounds.  Abdominal:     General: Abdomen is flat. Bowel sounds are normal.     Palpations: Abdomen is soft.  Musculoskeletal:        General: Normal range of motion.     Cervical back: Normal range of motion and neck supple.     Right lower leg: No edema.     Left lower leg: No edema.  Lymphadenopathy:     Cervical: No cervical adenopathy.  Neurological:     Mental Status: He is alert.  Psychiatric:        Mood and Affect: Mood normal.        Behavior: Behavior normal.        Thought Content: Thought content normal.        Judgment: Judgment normal.           04/14/2024    8:56 AM 03/24/2024    2:56 PM 08/13/2023   10:16 AM 07/09/2023   11:14 AM 05/21/2023    9:43 AM  Depression screen PHQ 2/9  Decreased Interest 0 0 1 0 0  Down, Depressed, Hopeless 0 0 0 0 0  PHQ - 2 Score 0 0 1 0 0  Altered sleeping 1 1 0 1 3  Tired, decreased energy 1 1 0 1 0  Change in appetite 1 0 0 0 1  Feeling bad or failure about yourself  0 0 1 0 1  Trouble concentrating 0 0 0 0 0  Moving slowly or fidgety/restless 0 0 0 0 0  Suicidal thoughts 0 0 0 0 0  PHQ-9 Score 3 2 2 2 5   Difficult doing work/chores Not difficult at all Not difficult at all  Somewhat difficult Not difficult at all      04/14/2024    8:57 AM 03/24/2024     2:56 PM 08/13/2023   10:17 AM 07/09/2023   11:14 AM  GAD 7 : Generalized Anxiety Score  Nervous, Anxious, on Edge 0 0 0 0  Control/stop worrying 0 0 0 0  Worry too much - different things 0 0 0 0  Trouble relaxing 0 0 0 0  Restless 0 0 0 0  Easily annoyed or irritable 0 0 1 1  Afraid - awful might happen 0 0 0 0  Total GAD 7 Score 0 0 1 1  Anxiety Difficulty Not difficult at all Not difficult at all Not difficult at all Not difficult at all    ASSESSMENT/PLAN:  Annual physical exam Assessment & Plan: Routine wellness visit conducted. Labs pending for further management. - Cologuard previously ordered - Annual labs ordered - Vaccines up to date    Type 2 diabetes with complication [facility]) Assessment & Plan: Diet controlled.  Type 2 diabetes management discussed. Awaiting lab results. - Review lab results when available. - Discuss diabetic foot care in next visit. - Ensure eye exam is scheduled for June.  Orders: -     Microalbumin / creatinine urine ratio -     Hemoglobin A1c  Hyperlipidemia associated with type 2 diabetes mellitus (HCC) Assessment & Plan: Hyperlipidemia management discussed. Potential dosage adjustment based on labs. - Review cholesterol levels when lab results are available. - Consider increasing atorvastatin  dosage based on lab results.  Orders: -     Lipid panel  Hypertension associated with diabetes (HCC) Assessment & Plan: Hypertension well-controlled with current medications. Monitoring kidney function for safe continuation. - Check kidney function before refilling medications. - Refill Amlodipine  10 mg daily - Refills Hyzaar 100-25 mg daily - Discussed importance of continuing medications to control BP  Orders: -     Comprehensive metabolic panel with GFR  Vitamin D  deficiency -     VITAMIN D  25 Hydroxy (Vit-D Deficiency, Fractures)  Primary osteoarthritis of both knees Assessment & Plan: Osteoarthritis causing chronic pain. Weight loss  needed before surgery. Discussed shockwave therapy benefits. - Consider referral for shockwave therapy if candidate. - Discuss with orthopedic specialist at [facility]. - Cr normal, BP controlled with current medications - Refill Relafen   500 mg BID    Primary osteoarthritis of both hips Assessment & Plan: Chronic pain management discussed. Current regimen includes Robaxin  and NSAIDs. Shockwave therapy potential benefit discussed. - Refill Robaxin  and NSAIDs after reviewing kidney function. - Consider referral for shockwave therapy. - Discuss potential benefits of shockwave therapy with sports medicine specialist.     PDMP reviewed  Return if symptoms worsen or fail to improve, for PCP.  Valli Gaw, MD

## 2024-04-14 NOTE — Patient Instructions (Addendum)
 It was a pleasure meeting you today. Thank you for allowing me to take part in your health care.  Our goals for today as we discussed include:  Will review labs today.  If kidney of will refill medications or make adjustment.  Please complete Cologuard as recommended  Can send referral to [facility] Dr Antony Kling, DO [address] [place], [place], [place] [address] Phone: [phone]  This is a list of the screening recommended for you and due dates:  Health Maintenance  Topic Date Due   Cologuard (Stool DNA test)  Never done   COVID-19 Vaccine (3 - 2024-25 season) 08/16/2023   Hemoglobin A1C  12/02/2023   Yearly kidney health urinalysis for diabetes  06/01/2024   Flu Shot  07/15/2024   Yearly kidney function blood test for diabetes  08/24/2024   Pneumococcal Vaccination (3 of 3 - PCV20 or PCV21) 11/29/2026   DTaP/Tdap/Td vaccine (2 - Td or Tdap) 08/12/2028   Hepatitis C Screening  Completed   HIV Screening  Completed   HPV Vaccine  Aged Out   Meningitis B Vaccine  Aged Out   Complete foot exam   Discontinued   Eye exam for diabetics  Discontinued      If you have any questions or concerns, please do not hesitate to call the office at [phone].  I look forward to our next visit and until then take care and stay safe.  Regards,   Valli Gaw, MD   [facility]

## 2024-04-15 ENCOUNTER — Other Ambulatory Visit: Payer: Self-pay | Admitting: Family Medicine

## 2024-04-15 DIAGNOSIS — M160 Bilateral primary osteoarthritis of hip: Secondary | ICD-10-CM

## 2024-04-15 DIAGNOSIS — I10 Essential (primary) hypertension: Secondary | ICD-10-CM

## 2024-04-15 DIAGNOSIS — E785 Hyperlipidemia, unspecified: Secondary | ICD-10-CM

## 2024-04-15 DIAGNOSIS — M1611 Unilateral primary osteoarthritis, right hip: Secondary | ICD-10-CM

## 2024-04-15 DIAGNOSIS — M545 Low back pain, unspecified: Secondary | ICD-10-CM

## 2024-04-15 MED ORDER — METHOCARBAMOL 500 MG PO TABS: 500.0000 mg | ORAL_TABLET | Freq: Three times a day (TID) | ORAL | 1 refills | Status: DC | PRN

## 2024-04-15 MED ORDER — CYCLOBENZAPRINE HCL 10 MG PO TABS: 10.0000 mg | ORAL_TABLET | Freq: Three times a day (TID) | ORAL | 1 refills | Status: AC | PRN

## 2024-04-15 MED ORDER — LOSARTAN POTASSIUM-HCTZ 100-25 MG PO TABS
1.0000 | ORAL_TABLET | Freq: Every day | ORAL | 3 refills | Status: DC
Start: 2024-04-15 — End: 2024-10-20

## 2024-04-15 MED ORDER — AMLODIPINE BESYLATE 10 MG PO TABS: 10.0000 mg | ORAL_TABLET | Freq: Every day | ORAL | 3 refills | Status: AC

## 2024-04-15 MED ORDER — VITAMIN D3 1.25 MG (50000 UT) PO CAPS: 1.0000 | ORAL_CAPSULE | ORAL | 1 refills | Status: AC

## 2024-04-15 MED ORDER — PANTOPRAZOLE SODIUM 40 MG PO TBEC: 40.0000 mg | DELAYED_RELEASE_TABLET | Freq: Every day | ORAL | 3 refills | Status: DC

## 2024-04-15 MED ORDER — NABUMETONE 500 MG PO TABS: 500.0000 mg | ORAL_TABLET | Freq: Two times a day (BID) | ORAL | 0 refills | Status: DC | PRN

## 2024-04-15 MED ORDER — ATORVASTATIN CALCIUM 10 MG PO TABS: 10.0000 mg | ORAL_TABLET | Freq: Every day | ORAL | 3 refills | Status: DC

## 2024-04-22 ENCOUNTER — Encounter: Payer: Self-pay | Admitting: Family Medicine

## 2024-04-22 NOTE — Assessment & Plan Note (Signed)
 Hyperlipidemia management discussed. Potential dosage adjustment based on labs. - Review cholesterol levels when lab results are available. - Consider increasing atorvastatin  dosage based on lab results.

## 2024-04-22 NOTE — Assessment & Plan Note (Signed)
 Hypertension well-controlled with current medications. Monitoring kidney function for safe continuation. - Check kidney function before refilling medications. - Refill Amlodipine  10 mg daily - Refills Hyzaar 100-25 mg daily - Discussed importance of continuing medications to control BP

## 2024-04-22 NOTE — Assessment & Plan Note (Signed)
 Routine wellness visit conducted. Labs pending for further management. - Cologuard previously ordered - Annual labs ordered - Vaccines up to date

## 2024-04-22 NOTE — Assessment & Plan Note (Signed)
 Diet controlled.  Type 2 diabetes management discussed. Awaiting lab results. - Review lab results when available. - Discuss diabetic foot care in next visit. - Ensure eye exam is scheduled for June.

## 2024-04-22 NOTE — Assessment & Plan Note (Signed)
 Chronic pain management discussed. Current regimen includes Robaxin  and NSAIDs. Shockwave therapy potential benefit discussed. - Refill Robaxin  and NSAIDs after reviewing kidney function. - Consider referral for shockwave therapy. - Discuss potential benefits of shockwave therapy with sports medicine specialist.

## 2024-04-22 NOTE — Assessment & Plan Note (Signed)
 Osteoarthritis causing chronic pain. Weight loss needed before surgery. Discussed shockwave therapy benefits. - Consider referral for shockwave therapy if candidate. - Discuss with orthopedic specialist at [facility]. - Cr normal, BP controlled with current medications - Refill Relafen  500 mg BID

## 2024-04-27 ENCOUNTER — Other Ambulatory Visit (HOSPITAL_COMMUNITY): Payer: Self-pay

## 2024-07-13 ENCOUNTER — Encounter: Payer: Self-pay | Admitting: Nurse Practitioner

## 2024-10-18 ENCOUNTER — Telehealth: Payer: Self-pay | Admitting: Nurse Practitioner

## 2024-10-18 NOTE — Telephone Encounter (Signed)
 Lm to call office to reschedule 10/20/2024 [facility], provider out of office. MyChart message sent.  E2C2 please reschedule appointment.

## 2024-10-20 ENCOUNTER — Other Ambulatory Visit: Payer: Self-pay | Admitting: Nurse Practitioner

## 2024-10-20 ENCOUNTER — Encounter: Admitting: Nurse Practitioner

## 2024-10-20 DIAGNOSIS — I10 Essential (primary) hypertension: Secondary | ICD-10-CM

## 2024-10-20 NOTE — Telephone Encounter (Signed)
 Copied from CRM [id number]. Topic: Clinical - Medication Question >> Oct 20, 2024  9:29 AM Larissa RAMAN wrote: Reason for CRM: Patient requesting a refill on Losartan  and Vitamin D . Former patient of DR. Hope. Transfer of care appointment scheduled for 02/10/25.    [id number] [place] [address], [place] - [address] AT [facility] [address] [place] [address] Phone: [phone] Fax: [phone] Hours: Not open 24 hours

## 2024-10-21 MED ORDER — LOSARTAN POTASSIUM-HCTZ 100-25 MG PO TABS: 1.0000 | ORAL_TABLET | Freq: Every day | ORAL | 3 refills | Status: AC

## 2024-11-08 ENCOUNTER — Telehealth: Payer: Self-pay | Admitting: Nurse Practitioner

## 2024-11-08 NOTE — Telephone Encounter (Signed)
 Lm and sent MyChart: Charles Huang will be out of the office on February 27th.  Please call the office or reschedule your appointment through MyChart.  E2C2 please reshedule appt

## 2024-12-14 ENCOUNTER — Ambulatory Visit: Payer: Self-pay

## 2024-12-14 DIAGNOSIS — M2141 Flat foot [pes planus] (acquired), right foot: Secondary | ICD-10-CM | POA: Diagnosis not present

## 2024-12-14 DIAGNOSIS — M216X2 Other acquired deformities of left foot: Secondary | ICD-10-CM

## 2024-12-14 DIAGNOSIS — M216X1 Other acquired deformities of right foot: Secondary | ICD-10-CM

## 2024-12-14 DIAGNOSIS — B351 Tinea unguium: Secondary | ICD-10-CM

## 2024-12-14 DIAGNOSIS — M2142 Flat foot [pes planus] (acquired), left foot: Secondary | ICD-10-CM | POA: Diagnosis not present

## 2024-12-14 DIAGNOSIS — B353 Tinea pedis: Secondary | ICD-10-CM | POA: Diagnosis not present

## 2024-12-14 MED ORDER — TERBINAFINE HCL 250 MG PO TABS: 250.0000 mg | ORAL_TABLET | Freq: Every day | ORAL | 0 refills | Status: AC

## 2024-12-14 NOTE — Progress Notes (Signed)
 "  Subjective:  Patient ID: Charles Huang, male    DOB: July 01, 1976,  MRN: [id number]  Chief Complaint  Patient presents with   Tinea Pedis    Rm6 Patient complains of athletes feet bilateral feet/ itching and skin peeling/OTC treatment with no relief.     Discussed the use of AI scribe software for clinical note transcription with the patient, who gave verbal consent to proceed.  History of Present Illness Charles Huang is a 48 year old male with chronic pes planus who presents for evaluation of right foot tinea pedis and bilateral onychomycosis.  He has persistent right great toe nail discoloration and subungual debris consistent with onychomycosis that has not improved with topical treatment. Symptoms are localized to the right hallux and nail without reported pain.  He has longstanding severe bilateral pes planus since childhood without current pain, functional limitation, or impact on daily activities.     Review of Systems: Negative except as noted in the HPI. Denies N/V/F/Ch.  Past Medical History:  Diagnosis Date   Annual physical exam 02/02/2020   Arthritis    hands, neck, back, knees    Atrial fibrillation (HCC)    Chronic pain syndrome 08/12/2018   Pain contract signed 08/11/18    Dysrhythmia    Afib   Erectile dysfunction 02/02/2020   Evaluation by psychiatric service required 09/06/2019   Hematuria 02/02/2020   Hyperlipidemia    Hypertension    Low back pain 02/09/2018   MRI 12/2018 lumbar   IMPRESSION:  Small right paracentral disc protrusion L2-3     Small central disc protrusion L3-4     Small central disc protrusion and facet degeneration L5-S1.  Subarticular stenosis bilaterally with possible impingement of the  S1 nerve root bilaterally.         Obesity (BMI 30-39.9) 11/13/2020   Pre-diabetes    Prediabetes    Prediabetes    Suspected sleep apnea 04/10/2017   Current Medications[1]  Tobacco Use History[2]  Allergies[3] Objective:   Constitutional Well  developed. Well nourished. Oriented to person, place, and time.  Vascular Dorsalis pedis pulses faintly bilaterally. Posterior tibial pulses faintly bilaterally. Capillary refill normal to all digits.  No cyanosis or clubbing noted. Pedal hair growth normal.  Neurologic Normal speech. Epicritic sensation to light touch grossly intact bilaterally. Negative tinel sign at tarsal tunnel bilaterally.   Dermatologic Skin texture and turgor are within normal limits.  No open wounds or skin lesions. Right foot has peeling in moccasin distribution consistent with tinea pedis.  Mild interdigital maceration.  No open wounds or lesions.  No signs of infection.   Nails 1 through 5 bilaterally thickened, crumbly, discolored with subungual debris.  Musculoskeletal: 5 out of 5 muscle strength all major pedal muscle groups.  pes planus foot shape that is nonreducible.  Gastrosoleus equinus present.  No pain to palpation of posterior tibial tendon, sinus tarsi, with hindfoot range of motion.    Assessment:   1. Onychomycosis   2. Pes planus of both feet   3. Acquired equinus deformity of both feet   4. Tinea pedis, right      Plan:  Patient was evaluated and treated and all questions answered.  Assessment and Plan Assessment & Plan Tinea pedis and onychomycosis of the right foot Chronic tinea pedis with mild interdigital maceration and onychomycosis, refractory to topical treatment. No diabetes or immunosuppression. - Prescribed oral terbinafine for three months. CMP from May shows normal liver function.  - Trimmed toenails today out  of courtesy. - Advised midday sock change to reduce moisture. - Educated on gradual nail improvement over one year. - RTC 3 months  Pes planus - Patient has issues with his knees and hips and is getting replacements for these.  I did discuss with him use of over-the-counter orthotic to help support his arch and decrease chance of his feet from becoming  symptomatic. - Discussed over-the-counter orthotics for pes planus.   [name], DPM AACFAS Fellowship Trained Podiatric Surgeon [facility]     [1]  Current Outpatient Medications:    amLODipine  (NORVASC ) 10 MG tablet, Take 1 tablet (10 mg total) by mouth daily., Disp: 90 tablet, Rfl: 3   atorvastatin  (LIPITOR) 10 MG tablet, Take 1 tablet (10 mg total) by mouth daily. After 6 pm, Disp: 90 tablet, Rfl: 3   Cholecalciferol  (VITAMIN D3) 1.25 MG (50000 UT) CAPS, Take 1 capsule (1.25 mg total) by mouth once a week., Disp: 12 capsule, Rfl: 1   cyclobenzaprine  (FLEXERIL ) 10 MG tablet, Take 1 tablet (10 mg total) by mouth 3 (three) times daily as needed for muscle spasms., Disp: 90 tablet, Rfl: 1   losartan -hydrochlorothiazide  (HYZAAR) 100-25 MG tablet, Take 1 tablet by mouth daily., Disp: 90 tablet, Rfl: 3   terbinafine (LAMISIL) 250 MG tablet, Take 1 tablet (250 mg total) by mouth daily., Disp: 90 tablet, Rfl: 0   methocarbamol  (ROBAXIN ) 500 MG tablet, Take 1 tablet (500 mg total) by mouth every 8 (eight) hours as needed., Disp: 30 tablet, Rfl: 1   nabumetone  (RELAFEN ) 500 MG tablet, Take 1 tablet (500 mg total) by mouth 2 (two) times daily as needed., Disp: 60 tablet, Rfl: 0   pantoprazole  (PROTONIX ) 40 MG tablet, Take 1 tablet (40 mg total) by mouth daily., Disp: 30 tablet, Rfl: 3 [2]  Social History Tobacco Use  Smoking Status Former   Current packs/day: 0.00   Types: Cigarettes   Quit date: 05/29/2016   Years since quitting: 8.5  Smokeless Tobacco Never  [3] No Known Allergies  "

## 2024-12-26 ENCOUNTER — Telehealth: Payer: Self-pay

## 2024-12-26 NOTE — Telephone Encounter (Signed)
 Pt came in and dropped off his FMLA forms and has been placed in back mailbox.

## 2024-12-26 NOTE — Telephone Encounter (Signed)
 Noted

## 2025-01-02 ENCOUNTER — Telehealth: Payer: Self-pay

## 2025-01-02 NOTE — Telephone Encounter (Signed)
 Called Patient and scheduled him an appointment for 01/04/25 for FMLA paperwork with Dr. Onesimo since his [facility] appointment with Chelsea Aurora is not until 03/10/25.

## 2025-01-04 ENCOUNTER — Encounter: Payer: Self-pay | Admitting: Internal Medicine

## 2025-01-04 ENCOUNTER — Ambulatory Visit: Admitting: Internal Medicine

## 2025-01-04 VITALS — BP 138/80 | HR 109 | Temp 97.7°F | Ht 74.0 in | Wt 326.0 lb

## 2025-01-04 DIAGNOSIS — E1169 Type 2 diabetes mellitus with other specified complication: Secondary | ICD-10-CM | POA: Diagnosis not present

## 2025-01-04 DIAGNOSIS — M160 Bilateral primary osteoarthritis of hip: Secondary | ICD-10-CM

## 2025-01-04 DIAGNOSIS — E785 Hyperlipidemia, unspecified: Secondary | ICD-10-CM | POA: Diagnosis not present

## 2025-01-04 DIAGNOSIS — E118 Type 2 diabetes mellitus with unspecified complications: Secondary | ICD-10-CM

## 2025-01-04 DIAGNOSIS — M170 Bilateral primary osteoarthritis of knee: Secondary | ICD-10-CM

## 2025-01-04 DIAGNOSIS — E538 Deficiency of other specified B group vitamins: Secondary | ICD-10-CM

## 2025-01-04 DIAGNOSIS — E1159 Type 2 diabetes mellitus with other circulatory complications: Secondary | ICD-10-CM

## 2025-01-04 DIAGNOSIS — Z8679 Personal history of other diseases of the circulatory system: Secondary | ICD-10-CM

## 2025-01-04 DIAGNOSIS — I152 Hypertension secondary to endocrine disorders: Secondary | ICD-10-CM

## 2025-01-04 DIAGNOSIS — Z6841 Body Mass Index (BMI) 40.0 and over, adult: Secondary | ICD-10-CM | POA: Diagnosis not present

## 2025-01-04 DIAGNOSIS — E559 Vitamin D deficiency, unspecified: Secondary | ICD-10-CM

## 2025-01-04 NOTE — Assessment & Plan Note (Signed)
-   This problem is chronic and stable -Patient last A1c was well-controlled -Patient is currently diet controlled -Will recheck his A1c today as well as his urine microalbumin to creatinine ratio -We did discuss diet and importance of avoiding  simple carbohydrates -Also discussed importance of exercise and weight loss -No further workup at this time

## 2025-01-04 NOTE — Assessment & Plan Note (Signed)
-   This problem is chronic -We will recheck his vitamin D  level today (last vitamin D  level was low) -He is on a daily supplement in addition to high-dose vitamin D  - I have asked him to hold off on his high-dose vitamin D  supplementation refill until we obtain his vitamin D  level today -No further workup at this time

## 2025-01-04 NOTE — Assessment & Plan Note (Signed)
-   This problem is chronic and slowly improving -Patient's weight is down to 326 pounds from 332 pounds. -We discussed the importance of diet and exercise (water -based exercise to help decrease pressure on his joints) -Patient will continue to try to lose weight -Will follow-up with his PCP in March

## 2025-01-04 NOTE — Assessment & Plan Note (Signed)
-   This problem is chronic and stable -We will recheck his vitamin B12 level today -No further workup at this time

## 2025-01-04 NOTE — Progress Notes (Signed)
 "  Acute Office Visit  Subjective:     Patient ID: Charles Huang, male    DOB: 1976-01-01, 49 y.o.   MRN: [id number]  Chief Complaint  Patient presents with   Acute Visit    FMLA paperwork     Discussed the use of AI scribe software for clinical note transcription with the patient, who gave verbal consent to proceed.  History of Present Illness Charles Huang is a 49 year old male who presents for FMLA paperwork and follow-up on weight management for upcoming knee and hip surgeries.  Lower extremity pain and deformity - Significant pain in the left knee, more severe than the right - No cartilage in the left knee - Left knee is sixteen degrees off center based on prior evaluations - Requires left hip replacement followed by knee replacement - Legs sometimes swell, attributed to water  retention - Pain managed with ibuprofen  and Tylenol  - No recent exercise due to pain  Weight management - Weight fluctuation attributed to water  retention, particularly in the legs - Actively working on weight loss in preparation for orthopedic surgeries  Hypertension and medication management - Currently taking losartan  hydrochlorothiazide  100/25 mg - Stopped amlodipine  due to number of medications  Vitamin d  deficiency - History of low vitamin D  levels - Taking daily vitamin D  supplement - Prescribed weekly high-dose vitamin D , needs to pick up prescription  Atrial fibrillation - History of atrial fibrillation in 2018 - Managed with breathing techniques - Previously on blood thinners, no recent episodes  Gastroesophageal reflux disease - History of reflux - Previously prescribed pantoprazole , not currently taking    Review of Systems  Constitutional: Negative.   HENT: Negative.    Respiratory: Negative.    Cardiovascular: Negative.   Gastrointestinal: Negative.   Musculoskeletal:  Positive for joint pain.  Neurological: Negative.   Psychiatric/Behavioral: Negative.           Objective:    BP 138/80   Pulse (!) 109   Temp 97.7 F (36.5 C)   Ht 6' 2 (1.88 m)   Wt (!) 326 lb (147.9 kg)   SpO2 96%   BMI 41.86 kg/m    Physical Exam Constitutional:      Appearance: Normal appearance.  HENT:     Head: Normocephalic and atraumatic.  Cardiovascular:     Rate and Rhythm: Normal rate and regular rhythm.     Heart sounds: Normal heart sounds.  Pulmonary:     Effort: Pulmonary effort is normal.     Breath sounds: Normal breath sounds. No wheezing, rhonchi or rales.  Abdominal:     General: Bowel sounds are normal. There is no distension.     Palpations: Abdomen is soft.     Tenderness: There is no abdominal tenderness. There is no guarding or rebound.  Musculoskeletal:        General: Swelling present. No tenderness.     Right lower leg: Edema present.     Left lower leg: Edema present.     Comments: 1+ bilateral lower extremity pitting edema noted  Neurological:     Mental Status: He is alert.  Psychiatric:        Mood and Affect: Mood normal.        Behavior: Behavior normal.     No results found for any visits on 01/04/25.      Assessment & Plan:   Problem List Items Addressed This Visit       Cardiovascular and Mediastinum   Hypertension associated  with diabetes (HCC)   - This problem is chronic and uncontrolled -Patient was previously well-controlled on amlodipine  10 mg daily as well as Hyzaar 100/25 mg daily -He started on amlodipine  secondary to being on a number of medications but is compliant with his Hyzaar -Initial blood pressure was 142/82.  He does attribute some of this elevation to pain in his knees and hips.  Repeat blood pressure was 138/80 -Patient blood pressure remains mildly elevated today (goal less than 130/80) -Patient has an appointment with his PCP in March and if it remains elevated at that time would consider reintroduction of amlodipine  possibly at a lower dose (5 mg daily) -Will recheck CMP today -No further  workup at this time      Relevant Orders   Comprehensive metabolic panel with GFR     Endocrine   Hyperlipidemia associated with type 2 diabetes mellitus (HCC) - Primary   - This problem is chronic and stable -Patient states that he is not taking atorvastatin  at this time -Will recheck a lipid panel on him -Patient would likely benefit from a moderate intensity statin.  Will follow-up results and discussed with patient -No further workup at this time      Relevant Orders   Comprehensive metabolic panel with GFR   Lipid panel   Type 2 diabetes with complication (HCC)   - This problem is chronic and stable -Patient last A1c was well-controlled -Patient is currently diet controlled -Will recheck his A1c today as well as his urine microalbumin to creatinine ratio -We did discuss diet and importance of avoiding  simple carbohydrates -Also discussed importance of exercise and weight loss -No further workup at this time      Relevant Orders   Comprehensive metabolic panel with GFR   Hemoglobin A1c   Microalbumin / creatinine urine ratio     Musculoskeletal and Integument   Osteoarthritis, hip, bilateral   - This problem is chronic and stable -Patient is awaiting hip replacement and complains of significant pain in his hips and knees -Will continue with Tylenol  and ibuprofen  for pain control -Patient is attempting to lose weight and will follow-up with orthopedics for scheduling hip replacement once his BMI is less than 40 -Weight loss strategies discussed with patient -No further workup at this time      Primary osteoarthritis of both knees   - This problem is chronic and uncontrolled -Patient complains of significant left knee pain in addition to moderate right knee pain -He is scheduled for left knee replacement once he gets a hip replacement done.  He is attempting to lose weight to get his BMI less than 40 to be eligible for the surgery -We discussed weight loss strategies  with the patient -Continue pain control with Tylenol  and ibuprofen  as needed -No further workup at this time        Other   History of atrial fibrillation   - This problem is chronic and well-controlled -Patient currently not on any rate control medications or blood thinners -States he has been asymptomatic for years -Denies any recurrent episodes -Patient is currently in a regular rhythm -No further workup at this time      Morbid obesity with BMI of 40.0-44.9, adult (HCC)   - This problem is chronic and slowly improving -Patient's weight is down to 326 pounds from 332 pounds. -We discussed the importance of diet and exercise (water -based exercise to help decrease pressure on his joints) -Patient will continue to try to lose weight -  Will follow-up with his PCP in March      Vitamin B 12 deficiency   - This problem is chronic and stable -We will recheck his vitamin B12 level today -No further workup at this time      Relevant Orders   B12   Vitamin D  deficiency   - This problem is chronic -We will recheck his vitamin D  level today (last vitamin D  level was low) -He is on a daily supplement in addition to high-dose vitamin D  - I have asked him to hold off on his high-dose vitamin D  supplementation refill until we obtain his vitamin D  level today -No further workup at this time      Relevant Orders   VITAMIN D  25 Hydroxy (Vit-D Deficiency, Fractures)    No orders of the defined types were placed in this encounter.   No follow-ups on file.  Karyssa Amaral, MD   "

## 2025-01-04 NOTE — Assessment & Plan Note (Signed)
-   This problem is chronic and uncontrolled -Patient was previously well-controlled on amlodipine  10 mg daily as well as Hyzaar 100/25 mg daily -He started on amlodipine  secondary to being on a number of medications but is compliant with his Hyzaar -Initial blood pressure was 142/82.  He does attribute some of this elevation to pain in his knees and hips.  Repeat blood pressure was 138/80 -Patient blood pressure remains mildly elevated today (goal less than 130/80) -Patient has an appointment with his PCP in March and if it remains elevated at that time would consider reintroduction of amlodipine  possibly at a lower dose (5 mg daily) -Will recheck CMP today -No further workup at this time

## 2025-01-04 NOTE — Assessment & Plan Note (Signed)
-   This problem is chronic and stable -Patient is awaiting hip replacement and complains of significant pain in his hips and knees -Will continue with Tylenol  and ibuprofen  for pain control -Patient is attempting to lose weight and will follow-up with orthopedics for scheduling hip replacement once his BMI is less than 40 -Weight loss strategies discussed with patient -No further workup at this time

## 2025-01-04 NOTE — Assessment & Plan Note (Signed)
-   This problem is chronic and well-controlled -Patient currently not on any rate control medications or blood thinners -States he has been asymptomatic for years -Denies any recurrent episodes -Patient is currently in a regular rhythm -No further workup at this time

## 2025-01-04 NOTE — Assessment & Plan Note (Signed)
-   This problem is chronic and uncontrolled -Patient complains of significant left knee pain in addition to moderate right knee pain -He is scheduled for left knee replacement once he gets a hip replacement done.  He is attempting to lose weight to get his BMI less than 40 to be eligible for the surgery -We discussed weight loss strategies with the patient -Continue pain control with Tylenol  and ibuprofen  as needed -No further workup at this time

## 2025-01-04 NOTE — Patient Instructions (Addendum)
" °  VISIT SUMMARY: During your visit, we discussed your FMLA paperwork and followed up on your weight management in preparation for your upcoming knee and hip surgeries. We also reviewed your conditions including lower extremity pain, hypertension, vitamin D  deficiency, atrial fibrillation, and gastroesophageal reflux disease.  YOUR PLAN: -LEFT HIP OSTEOARTHRITIS: Left hip osteoarthritis is a condition where the cartilage in the hip joint wears down, causing pain and stiffness. You are scheduled for a hip replacement surgery once your BMI is under 40. Continue your weight loss efforts to meet the surgery eligibility.  -LEFT KNEE OSTEOARTHRITIS WITH DEFORMITY: Left knee osteoarthritis with deformity is a condition where the cartilage in the knee joint wears down, causing pain and the knee to be misaligned. You are scheduled for a knee replacement surgery after your hip replacement. Continue managing your pain with ibuprofen  and Tylenol .  -MORBID OBESITY: Morbid obesity is a condition where your body mass index (BMI) is over 40, which can affect your overall health and surgery eligibility. Your current weight is 326 lbs, down from 332 lbs. Continue your weight loss efforts through healthier eating and exercise. Consider water  exercises to reduce joint pressure.  -HYPERTENSION: Hypertension is high blood pressure, which can lead to other health issues. You are currently taking losartan  hydrochlorothiazide  100/25 mg. Continue this medication and discuss the potential reintroduction of amlodipine  with your new primary care provider in March.  -TYPE 2 DIABETES MELLITUS, DIET CONTROLLED: Type 2 diabetes is a condition where your body does not use insulin properly, leading to high blood sugar levels. Your diabetes is currently controlled through diet. We have ordered an A1c test to assess your current diabetes control.  -VITAMIN D  DEFICIENCY: Vitamin D  deficiency is a condition where you have low levels of  vitamin D , which is important for bone health. You are taking a daily supplement and have a high-dose prescription pending. We have ordered a vitamin D  level test and will review the results before starting the high-dose prescription.  INSTRUCTIONS: Please continue your weight loss efforts and manage your pain with ibuprofen  and Tylenol . Follow up with your new primary care provider in March to discuss your blood pressure medication. Complete the ordered A1c and vitamin D  level tests. We will review the results and adjust your treatment plan as needed.    Contains text generated by [employer].   "

## 2025-01-04 NOTE — Assessment & Plan Note (Signed)
-   This problem is chronic and stable -Patient states that he is not taking atorvastatin  at this time -Will recheck a lipid panel on him -Patient would likely benefit from a moderate intensity statin.  Will follow-up results and discussed with patient -No further workup at this time

## 2025-01-05 LAB — COMPREHENSIVE METABOLIC PANEL WITH GFR
ALT: 35 U/L (ref 3–53)
AST: 36 U/L (ref 5–37)
Albumin: 4.6 g/dL (ref 3.5–5.2)
Alkaline Phosphatase: 52 U/L (ref 39–117)
BUN: 22 mg/dL (ref 6–23)
CO2: 29 meq/L (ref 19–32)
Calcium: 9.7 mg/dL (ref 8.4–10.5)
Chloride: 104 meq/L (ref 96–112)
Creatinine, Ser: 0.96 mg/dL (ref 0.40–1.50)
GFR: 93.51 mL/min
Glucose, Bld: 77 mg/dL (ref 70–99)
Potassium: 4 meq/L (ref 3.5–5.1)
Sodium: 141 meq/L (ref 135–145)
Total Bilirubin: 0.4 mg/dL (ref 0.2–1.2)
Total Protein: 7.4 g/dL (ref 6.0–8.3)

## 2025-01-05 LAB — LIPID PANEL
Cholesterol: 204 mg/dL — ABNORMAL HIGH (ref 28–200)
HDL: 54 mg/dL
LDL Cholesterol: 115 mg/dL — ABNORMAL HIGH (ref 10–99)
NonHDL: 149.75
Total CHOL/HDL Ratio: 4
Triglycerides: 173 mg/dL — ABNORMAL HIGH (ref 10.0–149.0)
VLDL: 34.6 mg/dL (ref 0.0–40.0)

## 2025-01-05 LAB — VITAMIN D 25 HYDROXY (VIT D DEFICIENCY, FRACTURES): VITD: 32.94 ng/mL (ref 30.00–100.00)

## 2025-01-05 LAB — VITAMIN B12: Vitamin B-12: 500 pg/mL (ref 211–911)

## 2025-01-05 LAB — MICROALBUMIN / CREATININE URINE RATIO
Creatinine,U: 123.7 mg/dL
Microalb Creat Ratio: 9.9 mg/g (ref 0.0–30.0)
Microalb, Ur: 1.2 mg/dL (ref 0.7–1.9)

## 2025-01-05 LAB — HEMOGLOBIN A1C: Hgb A1c MFr Bld: 5.7 % (ref 4.6–6.5)

## 2025-01-05 NOTE — Telephone Encounter (Signed)
 Patient saw Dr. Narendra on 01/04/25 for FMLA paperwork and FMLA was filled out and faxed to [employer] [facility] [employer] at [phone] and received a ok confirmation.

## 2025-01-06 ENCOUNTER — Encounter: Payer: Self-pay | Admitting: Internal Medicine

## 2025-01-06 ENCOUNTER — Ambulatory Visit: Payer: Self-pay | Admitting: Internal Medicine

## 2025-01-06 DIAGNOSIS — E785 Hyperlipidemia, unspecified: Secondary | ICD-10-CM

## 2025-01-06 DIAGNOSIS — E1169 Type 2 diabetes mellitus with other specified complication: Secondary | ICD-10-CM

## 2025-01-09 MED ORDER — ATORVASTATIN CALCIUM 20 MG PO TABS: 20.0000 mg | ORAL_TABLET | Freq: Every day | ORAL | 1 refills | Status: AC

## 2025-01-12 NOTE — Telephone Encounter (Signed)
 I will remind you to fill it out.

## 2025-01-18 ENCOUNTER — Encounter: Payer: Self-pay | Admitting: Internal Medicine

## 2025-01-18 NOTE — Telephone Encounter (Signed)
 Patient is aware that paperwork is updated and getting faxed.

## 2025-02-07 ENCOUNTER — Encounter: Admitting: Nurse Practitioner

## 2025-02-10 ENCOUNTER — Encounter: Admitting: Nurse Practitioner

## 2025-03-10 ENCOUNTER — Encounter: Admitting: Nurse Practitioner
